# Patient Record
Sex: Female | Born: 1980
Health system: Southern US, Community
[De-identification: ages and names within clinical notes are randomized; demographics above are authoritative.]

## PROBLEM LIST (undated history)

## (undated) DIAGNOSIS — F419 Anxiety disorder, unspecified: Secondary | ICD-10-CM

## (undated) DIAGNOSIS — F4310 Post-traumatic stress disorder, unspecified: Secondary | ICD-10-CM

## (undated) DIAGNOSIS — F319 Bipolar disorder, unspecified: Secondary | ICD-10-CM

## (undated) HISTORY — DX: Post-traumatic stress disorder, unspecified: F43.10

## (undated) HISTORY — DX: Anxiety disorder, unspecified: F41.9

## (undated) HISTORY — PX: ABDOMINAL HYSTERECTOMY: SHX81

---

## 2011-06-02 ENCOUNTER — Encounter: Payer: Self-pay | Admitting: Obstetrics and Gynecology

## 2011-06-08 ENCOUNTER — Encounter: Payer: Self-pay | Admitting: Obstetrics and Gynecology

## 2011-12-07 ENCOUNTER — Emergency Department (HOSPITAL_COMMUNITY)
Admission: EM | Admit: 2011-12-07 | Discharge: 2011-12-07 | Disposition: A | Discharge status: Home / Self Care | Payer: Self-pay | Attending: Emergency Medicine | Admitting: Emergency Medicine

## 2011-12-07 ENCOUNTER — Encounter (HOSPITAL_COMMUNITY): Payer: Self-pay | Admitting: *Deleted

## 2011-12-07 DIAGNOSIS — Z90710 Acquired absence of both cervix and uterus: Secondary | ICD-10-CM | POA: Insufficient documentation

## 2011-12-07 DIAGNOSIS — R350 Frequency of micturition: Secondary | ICD-10-CM | POA: Insufficient documentation

## 2011-12-07 DIAGNOSIS — R300 Dysuria: Secondary | ICD-10-CM | POA: Insufficient documentation

## 2011-12-07 DIAGNOSIS — R319 Hematuria, unspecified: Secondary | ICD-10-CM | POA: Insufficient documentation

## 2011-12-07 DIAGNOSIS — N390 Urinary tract infection, site not specified: Secondary | ICD-10-CM | POA: Insufficient documentation

## 2011-12-07 LAB — URINALYSIS, ROUTINE W REFLEX MICROSCOPIC
Glucose, UA: NEGATIVE mg/dL
Ketones, ur: 15 mg/dL — AB
Protein, ur: 30 mg/dL — AB

## 2011-12-07 LAB — URINE MICROSCOPIC-ADD ON

## 2011-12-07 MED ORDER — CEPHALEXIN 500 MG PO CAPS: 500.0000 mg | ORAL_CAPSULE | Freq: Two times a day (BID) | ORAL | Status: AC

## 2011-12-07 NOTE — ED Provider Notes (Signed)
History     CSN: [id number]  Arrival date & time 12/07/11  2013   First MD Initiated Contact with Patient 12/07/11 2150      Chief Complaint  Patient presents with  . Abdominal Pain    (Consider location/radiation/quality/duration/timing/severity/associated sxs/prior treatment) HPI: Deanna Powell is a 31 yo AAF with history of partial hysterectomy, who presents with lower abdominal pain, dysuria and urinary frequency. Symptoms started yesterday with mild lower abdominal pain and dysuria. Symptoms progressively worsened. Pain is described as burning, non-radiating, worsened with urination, has taken Azo with relief of pain. She noticed hematuria today with small clots. She has never had these symptoms before. She denies nausea, vomiting, fever or chills. Has been able to tolerate PO today. Finally, she denies vaginal discharge.   History reviewed. No pertinent past medical history.  History reviewed. No pertinent past surgical history.  No family history on file.  History  Substance Use Topics  . Smoking status: Never Smoker   . Smokeless tobacco: Not on file  . Alcohol Use:      Comment: casual    OB History    Grav Para Term Preterm Abortions TAB SAB Ect Mult Living                 Review of Systems  Constitutional: Negative for activity change and appetite change.  Eyes: Negative for photophobia.  Respiratory: Negative for cough and shortness of breath.   Cardiovascular: Negative for chest pain and palpitations.  Gastrointestinal: Negative for nausea, vomiting, abdominal pain and diarrhea.  Genitourinary: Positive for dysuria, urgency, frequency, hematuria and difficulty urinating. Negative for flank pain, decreased urine volume and vaginal discharge.  Musculoskeletal: Negative for myalgias and arthralgias.  Skin: Negative for pallor and rash.  Neurological: Negative for dizziness and headaches.  Psychiatric/Behavioral: Negative for behavioral problems and agitation.    All other systems reviewed and are negative.   Allergies  Review of patient's allergies indicates no known allergies.  Home Medications  No current outpatient prescriptions on file.  BP 119/77  Pulse 78  Temp 97.3 F (36.3 C) (Oral)  Resp 18  SpO2 97%  Physical Exam  Nursing note and vitals reviewed. Constitutional: She is oriented to person, place, and time. She appears well-developed and well-nourished.  HENT:  Head: Normocephalic and atraumatic.  Eyes: EOM are normal. Pupils are equal, round, and reactive to light.  Neck: Normal range of motion. Neck supple.  Cardiovascular: Normal rate, regular rhythm and normal heart sounds.   Pulmonary/Chest: Effort normal and breath sounds normal.  Abdominal: Soft. Bowel sounds are normal. There is tenderness (suprapubic ). There is no rebound and no guarding.  Musculoskeletal: Normal range of motion.  Neurological: She is alert and oriented to person, place, and time.  Skin: Skin is warm and dry.    ED Course  Procedures (including critical care time)  Labs Reviewed  URINALYSIS, ROUTINE W REFLEX MICROSCOPIC - Abnormal; Notable for the following:    Color, Urine ORANGE (*)  BIOCHEMICALS MAY BE AFFECTED BY COLOR   APPearance CLOUDY (*)     Hgb urine dipstick LARGE (*)     Bilirubin Urine SMALL (*)     Ketones, ur 15 (*)     Protein, ur 30 (*)     Urobilinogen, UA 4.0 (*)     Nitrite POSITIVE (*)     Leukocytes, UA SMALL (*)     All other components within normal limits  URINE MICROSCOPIC-ADD ON - Abnormal; Notable for the  following:    Bacteria, UA MANY (*)     All other components within normal limits  PREGNANCY, URINE  URINE CULTURE   No results found.  No diagnosis found.  MDM  31 yo AAF with history of partial hysterectomy who presents with symptoms c/w urinary tract infection. Afebrile, vital signs stable. Doubt serious intraabdominal pathology as she is only tender suprapubically, no RLQ or RUQ tenderness. Doubt  obstruction as he continues to have BM's and pass flatus. Doubt vaginal infection as she has no discharge. Felt to be stable for outpatient management as she is able to tolerate PO, appears well, HD stable. Will treat with Keflex and have her continue Azo for symptomatic relief. Return precautions including multiple episodes of vomiting, worsening pain, fever or other concerning symptoms were given. She is in agreement with plan and voiced understanding. Given list of PCP's in town to establish care. Rx for Keflex provided.   Reviewed labs and utilized in MDM  Discussed case with Dr. Jeraldine Loots.   Clinical Impression 1. Urinary tract infection  Results for orders placed during the hospital encounter of 12/07/11 (from the past 24 hour(s))  URINALYSIS, ROUTINE W REFLEX MICROSCOPIC     Status: Abnormal   Collection Time   12/07/11  8:31 PM      Component Value Range   Color, Urine ORANGE (*) YELLOW   APPearance CLOUDY (*) CLEAR   Specific Gravity, Urine 1.026  1.005 - 1.030   pH 7.5  5.0 - 8.0   Glucose, UA NEGATIVE  NEGATIVE mg/dL   Hgb urine dipstick LARGE (*) NEGATIVE   Bilirubin Urine SMALL (*) NEGATIVE   Ketones, ur 15 (*) NEGATIVE mg/dL   Protein, ur 30 (*) NEGATIVE mg/dL   Urobilinogen, UA 4.0 (*) 0.0 - 1.0 mg/dL   Nitrite POSITIVE (*) NEGATIVE   Leukocytes, UA SMALL (*) NEGATIVE  URINE MICROSCOPIC-ADD ON     Status: Abnormal   Collection Time   12/07/11  8:31 PM      Component Value Range   Squamous Epithelial / LPF RARE  RARE   WBC, UA 3-6  <3 WBC/hpf   RBC / HPF 21-50  <3 RBC/hpf   Bacteria, UA MANY (*) RARE   Urine-Other MUCOUS PRESENT    PREGNANCY, URINE     Status: Normal   Collection Time   12/07/11  8:32 PM      Component Value Range   Preg Test, Ur NEGATIVE  NEGATIVE           Margie Billet, MD 12/07/11 2338

## 2011-12-07 NOTE — ED Notes (Signed)
Patient states abdominal pain x 1 day, patient states pain with urination

## 2011-12-07 NOTE — ED Notes (Signed)
Pt discharged.Vital signs stable and GCS 15 

## 2011-12-07 NOTE — ED Provider Notes (Signed)
  I performed a history and physical examination of Deanna Powell and discussed her management with Dr. Freida Busman.  I agree with the history, physical, assessment, and plan of care, with the following exceptions: None On my exam the patient was in no distress, sitting upright, speaking with her children.  [name], MD 12/07/11 2350

## 2011-12-09 LAB — URINE CULTURE

## 2011-12-10 NOTE — ED Notes (Signed)
+  Urine. Patient treated with Keflex. Sensitive to same. Per protocol MD. °

## 2012-05-17 ENCOUNTER — Encounter (HOSPITAL_COMMUNITY): Payer: Self-pay | Admitting: *Deleted

## 2012-05-17 ENCOUNTER — Emergency Department (HOSPITAL_COMMUNITY)
Admission: EM | Admit: 2012-05-17 | Discharge: 2012-05-17 | Disposition: A | Discharge status: Home / Self Care | Payer: Self-pay | Attending: Emergency Medicine | Admitting: Emergency Medicine

## 2012-05-17 DIAGNOSIS — B86 Scabies: Secondary | ICD-10-CM | POA: Insufficient documentation

## 2012-05-17 MED ORDER — PERMETHRIN 5 % EX CREA: TOPICAL_CREAM | CUTANEOUS | Status: DC

## 2012-05-17 NOTE — ED Provider Notes (Addendum)
MDM     CSN: [id number]  Arrival date & time 05/17/12  2105   First MD Initiated Contact with Patient 05/17/12 2115      Chief Complaint  Patient presents with  . Rash    (Consider location/radiation/quality/duration/timing/severity/associated sxs/prior treatment) Patient is a 32 y.o. female presenting with rash. The history is provided by the patient. No language interpreter was used.  Rash Location:  Full body Severity:  Moderate Onset quality:  Sudden Duration:  3 days Timing:  Intermittent Progression:  Spreading Chronicity:  New Context: insect bite/sting and sick contacts   Context: not pregnancy   Relieved by:  Nothing Worsened by:  Nothing tried Ineffective treatments:  None tried Associated symptoms: no diarrhea, no fever, no headaches, no joint pain, no shortness of breath, no throat swelling, no tongue swelling, no URI, not vomiting and not wheezing     History reviewed. No pertinent past medical history.  Past Surgical History  Procedure Laterality Date  . Abdominal hysterectomy      No family history on file.  History  Substance Use Topics  . Smoking status: Never Smoker   . Smokeless tobacco: Not on file  . Alcohol Use: Not on file     Comment: casual    OB History   Grav Para Term Preterm Abortions TAB SAB Ect Mult Living                  Review of Systems  Constitutional: Negative for fever.  Respiratory: Negative for shortness of breath and wheezing.   Gastrointestinal: Negative for vomiting and diarrhea.  Musculoskeletal: Negative for arthralgias.  Skin: Positive for rash.  Neurological: Negative for headaches.  All other systems reviewed and are negative.    Allergies  Review of patient's allergies indicates no known allergies.  Home Medications   Current Outpatient Rx  Name  Route  Sig  Dispense  Refill  . AZO-CRANBERRY PO   Oral   Take 2 capsules by mouth 3 (three) times daily as needed. For urinary pain         . Multiple Vitamin (MULTIVITAMIN WITH MINERALS) TABS   Oral   Take 1 tablet by mouth daily. Probiotic vitamin         . permethrin (ELIMITE) 5 % cream      Apply to affected area once and wash off after 8 hours.  Please repeat in 7-10 days qs   60 g   1     BP 130/82  Pulse 93  Temp(Src) 98.5 F (36.9 C) (Oral)  Resp 17  Wt 180 lb 8.9 oz (81.9 kg)  SpO2 100%  Physical Exam  Nursing note and vitals reviewed. Constitutional: She is oriented to person, place, and time. She appears well-developed and well-nourished.  HENT:  Head: Normocephalic.  Right Ear: External ear normal.  Left Ear: External ear normal.  Nose: Nose normal.  Mouth/Throat: Oropharynx is clear and moist.  Eyes: EOM are normal. Pupils are equal, round, and reactive to light. Right eye exhibits no discharge. Left eye exhibits no discharge.  Neck: Normal range of motion. Neck supple. No tracheal deviation present.  No nuchal rigidity no meningeal signs  Cardiovascular: Normal rate and regular rhythm.   Pulmonary/Chest: Effort normal and breath sounds normal. No stridor. No respiratory distress. She has no wheezes. She has no rales.  Abdominal: Soft. She exhibits no distension and no mass. There is no tenderness. There is no rebound and no guarding.  Musculoskeletal:  Normal range of motion. She exhibits no edema and no tenderness.  Neurological: She is alert and oriented to person, place, and time. She has normal reflexes. No cranial nerve deficit. Coordination normal.  Skin: Skin is warm. Rash noted. She is not diaphoretic. No erythema. No pallor.  No pettechia no purpura  multiple raised lesions located over arms back chest and abdomen with associated burrows petechiae no purpura no induration fluctuance or tenderness    ED Course  Procedures (including critical care time)  Labs Reviewed - No data to display No results found.   1. Scabies       MDM  Patient most likely with what appears to be  scabies. I will start on permethrin discharge home. No fever history to suggest infectious process. Patient is well-appearing and in no distress. No evidence of actual large insect bites noted. pt updated and agrees with plan.       Arley Phenix, MD 05/17/12 2209  Arley Phenix, MD 05/17/12 2209

## 2012-05-17 NOTE — ED Notes (Signed)
Pt has a rash all over her body.  She has some scabbed areas where she has been scratching.  She said there was a bedbug outbreak in her apt complex.

## 2015-01-06 DIAGNOSIS — F39 Unspecified mood [affective] disorder: Secondary | ICD-10-CM | POA: Insufficient documentation

## 2015-05-05 ENCOUNTER — Ambulatory Visit (HOSPITAL_COMMUNITY): Payer: Self-pay | Admitting: Psychiatry

## 2015-05-19 ENCOUNTER — Ambulatory Visit (INDEPENDENT_AMBULATORY_CARE_PROVIDER_SITE_OTHER): Payer: BLUE CROSS/BLUE SHIELD | Admitting: Psychiatry

## 2015-05-19 ENCOUNTER — Encounter (HOSPITAL_COMMUNITY): Payer: Self-pay | Admitting: Psychiatry

## 2015-05-19 VITALS — BP 124/76 | HR 82 | Wt 190.0 lb

## 2015-05-19 DIAGNOSIS — F0630 Mood disorder due to known physiological condition, unspecified: Secondary | ICD-10-CM | POA: Diagnosis not present

## 2015-05-19 DIAGNOSIS — F3160 Bipolar disorder, current episode mixed, unspecified: Secondary | ICD-10-CM | POA: Diagnosis not present

## 2015-05-19 DIAGNOSIS — F4310 Post-traumatic stress disorder, unspecified: Secondary | ICD-10-CM

## 2015-05-19 DIAGNOSIS — F411 Generalized anxiety disorder: Secondary | ICD-10-CM | POA: Diagnosis not present

## 2015-05-19 DIAGNOSIS — G4700 Insomnia, unspecified: Secondary | ICD-10-CM | POA: Diagnosis not present

## 2015-05-19 MED ORDER — CLONAZEPAM 0.5 MG PO TABS: 0.5000 mg | ORAL_TABLET | Freq: Two times a day (BID) | ORAL | Status: DC | PRN

## 2015-05-19 MED ORDER — SERTRALINE HCL 100 MG PO TABS: 150.0000 mg | ORAL_TABLET | Freq: Every day | ORAL | Status: DC

## 2015-05-19 MED ORDER — QUETIAPINE FUMARATE 400 MG PO TABS: 400.0000 mg | ORAL_TABLET | Freq: Every day | ORAL | Status: DC
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
## 2015-06-03 ENCOUNTER — Ambulatory Visit (HOSPITAL_COMMUNITY): Payer: Self-pay | Admitting: Psychiatry

## 2015-06-04 ENCOUNTER — Ambulatory Visit (HOSPITAL_COMMUNITY): Payer: Self-pay | Admitting: Licensed Clinical Social Worker

## 2015-06-09 ENCOUNTER — Ambulatory Visit (HOSPITAL_COMMUNITY): Payer: Self-pay | Admitting: Psychiatry

## 2015-06-23 ENCOUNTER — Ambulatory Visit (HOSPITAL_COMMUNITY): Payer: Self-pay | Admitting: Psychiatry

## 2015-06-29 LAB — HM PAP SMEAR

## 2015-08-26 ENCOUNTER — Ambulatory Visit (HOSPITAL_COMMUNITY): Payer: Self-pay | Admitting: Psychiatry

## 2015-09-13 ENCOUNTER — Ambulatory Visit (HOSPITAL_COMMUNITY): Payer: Self-pay | Admitting: Psychiatry

## 2015-09-24 DIAGNOSIS — F319 Bipolar disorder, unspecified: Secondary | ICD-10-CM | POA: Insufficient documentation

## 2015-11-05 DIAGNOSIS — B372 Candidiasis of skin and nail: Secondary | ICD-10-CM | POA: Insufficient documentation

## 2016-01-11 ENCOUNTER — Ambulatory Visit (INDEPENDENT_AMBULATORY_CARE_PROVIDER_SITE_OTHER): Payer: BLUE CROSS/BLUE SHIELD | Admitting: Physician Assistant

## 2016-01-11 VITALS — BP 138/85 | HR 96 | Temp 98.8°F | Resp 16 | Ht 66.0 in | Wt 207.0 lb

## 2016-01-11 DIAGNOSIS — J111 Influenza due to unidentified influenza virus with other respiratory manifestations: Secondary | ICD-10-CM

## 2016-01-11 DIAGNOSIS — R69 Illness, unspecified: Secondary | ICD-10-CM

## 2016-01-11 MED ORDER — HYDROCODONE-HOMATROPINE 5-1.5 MG/5ML PO SYRP: 2.5000 mL | ORAL_SOLUTION | Freq: Two times a day (BID) | ORAL | 0 refills | Status: AC

## 2016-01-11 MED ORDER — NAPROXEN 500 MG PO TABS: 500.0000 mg | ORAL_TABLET | Freq: Two times a day (BID) | ORAL | 0 refills | Status: DC

## 2016-01-11 NOTE — Patient Instructions (Signed)
     IF you received an x-ray today, you will receive an invoice from [facility]. Please contact [facility] at [phone] with questions or concerns regarding your invoice.   IF you received labwork today, you will receive an invoice from [facility]. Please contact [facility] at [phone] with questions or concerns regarding your invoice.   Our billing staff will not be able to assist you with questions regarding bills from these companies.  You will be contacted with the lab results as soon as they are available. The fastest way to get your results is to activate your My Chart account. Instructions are located on the last page of this paperwork. If you have not heard from us regarding the results in 2 weeks, please contact this office.     

## 2016-01-11 NOTE — Progress Notes (Signed)
   01/11/2016 10:57 AM   DOB: 02/26/1980 / MRN: [id number]  SUBJECTIVE:  Deanna PileRegina Powell is a 35 y.o. female presenting for productive non bloody cough that started 4 days ago.  Complains of tactile fever and fatigue, muscle aches and generalized HA. She has taken Mucinex and this did not help.  She did not get the flu shot this year. No history of asthma.  She is a non smoker.  Reports the illness was sudden in onset. No history of asthma, COPD, smoking, diabetes and history of hysterectomy. She feels she is getting worse.   She has No Known Allergies.   She  has no past medical history on file.    She  reports that she has never smoked. She does not have any smokeless tobacco history on file. She reports that she does not drink alcohol or use drugs. She  reports that she does not currently engage in sexual activity. The patient  has a past surgical history that includes Abdominal hysterectomy.  Her family history is not on file.  Review of Systems  Constitutional: Positive for chills and fever.  Musculoskeletal: Positive for myalgias.  Skin: Negative for itching and rash.  Neurological: Negative for dizziness.    The problem list and medications were reviewed and updated by myself where necessary and exist elsewhere in the encounter.   OBJECTIVE:  BP (!) 144/98   Pulse 96   Temp 98.8 F (37.1 C) (Oral)   Resp 16   Ht 5\' 6"  (1.676 m)   Wt 207 lb (93.9 kg)   SpO2 99%   BMI 33.41 kg/m   Physical Exam  Constitutional: She is oriented to person, place, and time.  HENT:  Nose: No sinus tenderness. No epistaxis. Right sinus exhibits no maxillary sinus tenderness and no frontal sinus tenderness. Left sinus exhibits no maxillary sinus tenderness and no frontal sinus tenderness.    Cardiovascular: Normal rate and regular rhythm.   Pulmonary/Chest: Effort normal and breath sounds normal. No respiratory distress. She has no wheezes. She has no rales. She exhibits no tenderness.    Musculoskeletal: Normal range of motion.  Neurological: She is alert and oriented to person, place, and time.    No results found for this or any previous visit (from the past 72 hour(s)).  No results found.  ASSESSMENT AND PLAN  Rene KocherRegina was seen today for cough, nasal congestion, sore throat and epistaxis.  Diagnoses and all orders for this visit:  Influenza-like illness: I think she probably had'has the flu.  No comorbid conditions and too late for tamiflu.  She will stay out of work for 2 days after today and I will treat her symptoms.  -     naproxen (NAPROSYN) 500 MG tablet; Take 1 tablet (500 mg total) by mouth 2 (two) times daily with a meal. -     HYDROcodone-homatropine (HYCODAN) 5-1.5 MG/5ML syrup; Take 2.5-5 mLs by mouth 2 (two) times daily. -     Care order/instruction:    The patient is advised to call or return to clinic if she does not see an improvement in symptoms, or to seek the care of the closest emergency department if she worsens with the above plan.   Deliah BostonMichael Aava Deland, MHS, PA-C [facility] 01/11/2016 10:57 AM

## 2016-02-12 ENCOUNTER — Ambulatory Visit: Payer: BLUE CROSS/BLUE SHIELD

## 2016-02-14 ENCOUNTER — Ambulatory Visit: Payer: BLUE CROSS/BLUE SHIELD

## 2016-02-16 DIAGNOSIS — Z0271 Encounter for disability determination: Secondary | ICD-10-CM

## 2016-02-17 ENCOUNTER — Ambulatory Visit (INDEPENDENT_AMBULATORY_CARE_PROVIDER_SITE_OTHER): Payer: BLUE CROSS/BLUE SHIELD | Admitting: Physician Assistant

## 2016-02-17 VITALS — BP 132/84 | HR 89 | Temp 98.7°F | Resp 16 | Ht 67.0 in | Wt 208.6 lb

## 2016-02-17 DIAGNOSIS — T07XXXA Unspecified multiple injuries, initial encounter: Secondary | ICD-10-CM | POA: Diagnosis not present

## 2016-02-17 DIAGNOSIS — Z760 Encounter for issue of repeat prescription: Secondary | ICD-10-CM | POA: Diagnosis not present
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
## 2016-02-17 NOTE — Patient Instructions (Signed)
     IF you received an x-ray today, you will receive an invoice from [facility]. Please contact [facility] at [phone] with questions or concerns regarding your invoice.   IF you received labwork today, you will receive an invoice from [facility]. Please contact [facility] at [phone] with questions or concerns regarding your invoice.   Our billing staff will not be able to assist you with questions regarding bills from these companies.  You will be contacted with the lab results as soon as they are available. The fastest way to get your results is to activate your My Chart account. Instructions are located on the last page of this paperwork. If you have not heard from us regarding the results in 2 weeks, please contact this office.     

## 2016-06-26 ENCOUNTER — Emergency Department (HOSPITAL_BASED_OUTPATIENT_CLINIC_OR_DEPARTMENT_OTHER): Payer: BLUE CROSS/BLUE SHIELD

## 2016-06-26 ENCOUNTER — Encounter (HOSPITAL_BASED_OUTPATIENT_CLINIC_OR_DEPARTMENT_OTHER): Payer: Self-pay | Admitting: *Deleted

## 2016-06-26 ENCOUNTER — Emergency Department (HOSPITAL_BASED_OUTPATIENT_CLINIC_OR_DEPARTMENT_OTHER)
Admission: EM | Admit: 2016-06-26 | Discharge: 2016-06-26 | Disposition: A | Discharge status: Home / Self Care | Payer: BLUE CROSS/BLUE SHIELD | Attending: Emergency Medicine | Admitting: Emergency Medicine

## 2016-06-26 DIAGNOSIS — S060X0A Concussion without loss of consciousness, initial encounter: Secondary | ICD-10-CM | POA: Diagnosis not present

## 2016-06-26 DIAGNOSIS — M79632 Pain in left forearm: Secondary | ICD-10-CM

## 2016-06-26 DIAGNOSIS — Y939 Activity, unspecified: Secondary | ICD-10-CM | POA: Insufficient documentation

## 2016-06-26 DIAGNOSIS — Y929 Unspecified place or not applicable: Secondary | ICD-10-CM | POA: Diagnosis not present

## 2016-06-26 DIAGNOSIS — Y999 Unspecified external cause status: Secondary | ICD-10-CM | POA: Diagnosis not present

## 2016-06-26 DIAGNOSIS — M79602 Pain in left arm: Secondary | ICD-10-CM | POA: Diagnosis present

## 2016-06-26 HISTORY — DX: Bipolar disorder, unspecified: F31.9

## 2016-06-26 MED ORDER — IBUPROFEN 800 MG PO TABS: 800.0000 mg | ORAL_TABLET | Freq: Three times a day (TID) | ORAL | 0 refills | Status: DC | PRN

## 2016-06-26 NOTE — Discharge Instructions (Addendum)
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
## 2016-06-26 NOTE — ED Notes (Signed)
Patient transported to X-ray 

## 2016-06-28 ENCOUNTER — Encounter: Payer: Self-pay | Admitting: Family Medicine

## 2016-06-28 ENCOUNTER — Other Ambulatory Visit (HOSPITAL_COMMUNITY)
Admission: RE | Admit: 2016-06-28 | Discharge: 2016-06-28 | Disposition: A | Discharge status: Home / Self Care | Payer: BLUE CROSS/BLUE SHIELD | Source: Ambulatory Visit | Attending: Family Medicine | Admitting: Family Medicine

## 2016-06-28 ENCOUNTER — Ambulatory Visit (INDEPENDENT_AMBULATORY_CARE_PROVIDER_SITE_OTHER): Payer: BLUE CROSS/BLUE SHIELD | Admitting: Family Medicine

## 2016-06-28 VITALS — BP 130/88 | HR 73 | Temp 98.3°F | Ht 67.0 in | Wt 196.0 lb

## 2016-06-28 DIAGNOSIS — F319 Bipolar disorder, unspecified: Secondary | ICD-10-CM | POA: Insufficient documentation

## 2016-06-28 DIAGNOSIS — N898 Other specified noninflammatory disorders of vagina: Secondary | ICD-10-CM

## 2016-06-28 DIAGNOSIS — S060X0A Concussion without loss of consciousness, initial encounter: Secondary | ICD-10-CM | POA: Diagnosis not present

## 2016-06-28 LAB — POC URINALSYSI DIPSTICK (AUTOMATED)
Bilirubin, UA: NEGATIVE
Glucose, UA: NEGATIVE
Ketones, UA: NEGATIVE
Leukocytes, UA: NEGATIVE
Nitrite, UA: NEGATIVE
PH UA: 6 (ref 5.0–8.0)
RBC UA: NEGATIVE
UROBILINOGEN UA: 1 U/dL

## 2016-06-28 LAB — CBC WITH DIFFERENTIAL/PLATELET
BASOS ABS: 0 10*3/uL (ref 0.0–0.1)
Basophils Relative: 0.5 % (ref 0.0–3.0)
EOS ABS: 0.2 10*3/uL (ref 0.0–0.7)
Eosinophils Relative: 2.6 % (ref 0.0–5.0)
HEMATOCRIT: 37.3 % (ref 36.0–46.0)
HEMOGLOBIN: 11.8 g/dL — AB (ref 12.0–15.0)
LYMPHS PCT: 33.6 % (ref 12.0–46.0)
Lymphs Abs: 2 10*3/uL (ref 0.7–4.0)
MCHC: 31.6 g/dL (ref 30.0–36.0)
MCV: 75.2 fl — AB (ref 78.0–100.0)
MONOS PCT: 6.1 % (ref 3.0–12.0)
Monocytes Absolute: 0.4 10*3/uL (ref 0.1–1.0)
Neutro Abs: 3.4 10*3/uL (ref 1.4–7.7)
Neutrophils Relative %: 57.2 % (ref 43.0–77.0)
Platelets: 278 10*3/uL (ref 150.0–400.0)
RBC: 4.96 Mil/uL (ref 3.87–5.11)
RDW: 16 % — ABNORMAL HIGH (ref 11.5–15.5)
WBC: 6 10*3/uL (ref 4.0–10.5)

## 2016-06-28 LAB — COMPREHENSIVE METABOLIC PANEL
ALBUMIN: 4.3 g/dL (ref 3.5–5.2)
ALK PHOS: 55 U/L (ref 39–117)
ALT: 10 U/L (ref 0–35)
AST: 12 U/L (ref 0–37)
BILIRUBIN TOTAL: 0.3 mg/dL (ref 0.2–1.2)
BUN: 12 mg/dL (ref 6–23)
CALCIUM: 9.3 mg/dL (ref 8.4–10.5)
CO2: 30 mEq/L (ref 19–32)
Chloride: 104 mEq/L (ref 96–112)
Creatinine, Ser: 0.69 mg/dL (ref 0.40–1.20)
GFR: 122.44 mL/min (ref >60.00)
Glucose, Bld: 102 mg/dL — ABNORMAL HIGH (ref 70–99)
Potassium: 3.5 mEq/L (ref 3.5–5.1)
Sodium: 140 mEq/L (ref 135–145)
Total Protein: 7.2 g/dL (ref 6.0–8.3)

## 2016-06-28 MED ORDER — DIVALPROEX SODIUM 250 MG PO DR TAB: 250.0000 mg | DELAYED_RELEASE_TABLET | Freq: Three times a day (TID) | ORAL | 1 refills | Status: DC

## 2016-06-28 MED FILL — DIVALPROEX SOD DR 250 MG TA: 250 | 30 days supply | Qty: 90 | Fill #0
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
## 2016-06-28 NOTE — Patient Instructions (Addendum)
Give your headache 3-4 weeks to settle down.  Give us 2-3 business days to get the results of your labs back.   Call some of the following to see about getting an appointment:  [facility] [address] [place], [place] [address] [phone]  [facility] [address] [place], [place] [address] [phone]  [facility] [address] [place], [place] [address] [phone]  Dr. Andee PolesParish McKinney [address] [place], [place] [address] [phone]

## 2016-06-29 LAB — URINE CYTOLOGY ANCILLARY ONLY
Chlamydia: NEGATIVE
NEISSERIA GONORRHEA: NEGATIVE
TRICH (WINDOWPATH): NEGATIVE

## 2016-07-03 ENCOUNTER — Other Ambulatory Visit: Payer: Self-pay | Admitting: Family Medicine

## 2016-07-03 LAB — URINE CYTOLOGY ANCILLARY ONLY
Bacterial vaginitis: POSITIVE — AB
Candida vaginitis: NEGATIVE

## 2016-07-03 MED ORDER — METRONIDAZOLE 500 MG PO TABS: 500.0000 mg | ORAL_TABLET | Freq: Two times a day (BID) | ORAL | 0 refills | Status: AC

## 2016-07-03 NOTE — Progress Notes (Signed)
Flagyl called in for positive culture result. MyChart message sent.

## 2016-07-10 ENCOUNTER — Telehealth: Payer: Self-pay | Admitting: *Deleted

## 2016-07-10 DIAGNOSIS — D509 Iron deficiency anemia, unspecified: Secondary | ICD-10-CM

## 2016-07-10 NOTE — Telephone Encounter (Signed)
Called and spoke with the pt regarding her recent lab results.  Pt stated that she received the MyChart message from Dr. Carmelia RollerWendling and she verbalized understanding.  Pt was scheduled a lab appt for further lab testing for (Wed-07/12/16 @ 4:00pm).  She also stated that she will be going and getting the new prescription that was sent to the pharmacy.   Asked the pt if she understood what the prescription was and how to take.  She stated that she did.   Future labs ordered and sent.//AB/CMA

## 2016-07-12 ENCOUNTER — Telehealth: Payer: Self-pay | Admitting: Family Medicine

## 2016-07-12 ENCOUNTER — Other Ambulatory Visit (INDEPENDENT_AMBULATORY_CARE_PROVIDER_SITE_OTHER): Payer: BLUE CROSS/BLUE SHIELD

## 2016-07-12 DIAGNOSIS — D509 Iron deficiency anemia, unspecified: Secondary | ICD-10-CM | POA: Diagnosis not present

## 2016-07-12 NOTE — Telephone Encounter (Signed)
Pt request sleep medication and anxiety medication. Pt states father on life support and she is in a manic state and has had no sleep for a week. Pt uses [facility] on [address]. Pt ph [phone].

## 2016-07-12 NOTE — Telephone Encounter (Signed)
Please advise.//AB/CMA 

## 2016-07-12 NOTE — Telephone Encounter (Signed)
I would very much like her to come in before prescribing any medication for that. TY.

## 2016-07-13 LAB — IRON: Iron: 28 ug/dL — ABNORMAL LOW (ref 42–145)

## 2016-07-13 LAB — FERRITIN: Ferritin: 19.3 ng/mL (ref 10.0–291.0)

## 2016-07-13 NOTE — Telephone Encounter (Signed)
[facility] @ 3:00[facility] @ [phone]) asking the pt to RTC regarding message below.//AB/CMA

## 2016-07-14 ENCOUNTER — Ambulatory Visit: Payer: Self-pay | Admitting: Family Medicine

## 2016-07-14 ENCOUNTER — Telehealth: Payer: Self-pay | Admitting: Family Medicine

## 2016-07-14 ENCOUNTER — Encounter: Payer: Self-pay | Admitting: Family Medicine

## 2016-07-14 NOTE — Telephone Encounter (Signed)
Pt lvm at 2:22 to cancel her appt. Pt says that she is stuck at work and is unable to leave.    Should pt be charged a no show fee?

## 2016-07-14 NOTE — Telephone Encounter (Signed)
No. Please reinforce the cancellation policy however.

## 2016-07-14 NOTE — Telephone Encounter (Signed)
Pt scheduled an appt for (Friday-07/14/16), and no showed.//AB/CMA

## 2016-07-26 ENCOUNTER — Ambulatory Visit (INDEPENDENT_AMBULATORY_CARE_PROVIDER_SITE_OTHER): Payer: BLUE CROSS/BLUE SHIELD | Admitting: Family Medicine

## 2016-07-26 ENCOUNTER — Encounter: Payer: Self-pay | Admitting: Family Medicine

## 2016-07-26 VITALS — BP 118/88 | HR 83 | Temp 97.8°F | Ht 67.0 in | Wt 200.8 lb

## 2016-07-26 DIAGNOSIS — B9689 Other specified bacterial agents as the cause of diseases classified elsewhere: Secondary | ICD-10-CM

## 2016-07-26 DIAGNOSIS — F319 Bipolar disorder, unspecified: Secondary | ICD-10-CM

## 2016-07-26 DIAGNOSIS — N760 Acute vaginitis: Secondary | ICD-10-CM

## 2016-07-26 MED ORDER — METRONIDAZOLE 500 MG PO TABS: 500.0000 mg | ORAL_TABLET | Freq: Two times a day (BID) | ORAL | 0 refills | Status: AC

## 2016-07-26 MED ORDER — QUETIAPINE FUMARATE 200 MG PO TABS: 200.0000 mg | ORAL_TABLET | Freq: Every day | ORAL | 2 refills | Status: DC

## 2016-07-26 MED FILL — metroNIDAZOLE 500 MG TABS: 500 | 7 days supply | Qty: 14 | Fill #0

## 2016-07-26 MED FILL — QUETIAPINE FUMARATE 200 MG: 200 | 30 days supply | Qty: 30 | Fill #0
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
## 2016-07-26 NOTE — Patient Instructions (Addendum)
[facility] [address] [place], [place] [address] [phone]  [facility] [address] [place], [place] [address] [phone]  [facility] [address] [place], [place] [address] [phone]  Dr. Andee PolesParish McKinney [address] [place], [place] [address] [phone]  Metamucil as an option for your bowels. Stay well hydrated with water. Stop drinking so much soda. Taper down.  Do not drink alcohol with the metronidazole.

## 2016-08-04 ENCOUNTER — Telehealth: Payer: Self-pay | Admitting: Family Medicine

## 2016-08-04 NOTE — Telephone Encounter (Signed)
Pt dropped off FMLA paperwork to be filled out by provider ASAP. Pt would like to be called when document ready. Document given to Princess (helping Dr Carmelia RollerWendling).

## 2016-09-06 ENCOUNTER — Ambulatory Visit: Payer: Self-pay | Admitting: Family Medicine

## 2016-09-06 ENCOUNTER — Telehealth: Payer: Self-pay | Admitting: Family Medicine

## 2016-09-06 NOTE — Telephone Encounter (Signed)
Patient called and slept through appt, she works second shift. She did reschedule for tomorrow. Charge NOS fee?

## 2016-09-06 NOTE — Telephone Encounter (Signed)
No charge. 

## 2016-09-07 ENCOUNTER — Ambulatory Visit (INDEPENDENT_AMBULATORY_CARE_PROVIDER_SITE_OTHER): Payer: BLUE CROSS/BLUE SHIELD | Admitting: Family Medicine

## 2016-09-07 ENCOUNTER — Encounter: Payer: Self-pay | Admitting: Family Medicine

## 2016-09-07 ENCOUNTER — Other Ambulatory Visit (HOSPITAL_COMMUNITY)
Admission: RE | Admit: 2016-09-07 | Discharge: 2016-09-07 | Disposition: A | Discharge status: Home / Self Care | Payer: BLUE CROSS/BLUE SHIELD | Source: Ambulatory Visit | Attending: Family Medicine | Admitting: Family Medicine

## 2016-09-07 VITALS — BP 120/84 | HR 92 | Temp 98.7°F | Ht 66.0 in | Wt 202.0 lb

## 2016-09-07 DIAGNOSIS — N898 Other specified noninflammatory disorders of vagina: Secondary | ICD-10-CM | POA: Insufficient documentation

## 2016-09-07 DIAGNOSIS — F319 Bipolar disorder, unspecified: Secondary | ICD-10-CM | POA: Diagnosis not present

## 2016-09-07 LAB — POC URINALSYSI DIPSTICK (AUTOMATED)
BILIRUBIN UA: NEGATIVE
Blood, UA: NEGATIVE
GLUCOSE UA: NEGATIVE
Ketones, UA: NEGATIVE
LEUKOCYTES UA: NEGATIVE
NITRITE UA: NEGATIVE
Protein, UA: NEGATIVE
Spec Grav, UA: 1.015 (ref 1.010–1.025)
Urobilinogen, UA: 1 E.U./dL
pH, UA: 7 (ref 5.0–8.0)

## 2016-09-07 MED ORDER — DIVALPROEX SODIUM 500 MG PO DR TAB: 500.0000 mg | DELAYED_RELEASE_TABLET | Freq: Three times a day (TID) | ORAL | 1 refills | Status: DC

## 2016-09-07 MED ORDER — CLONAZEPAM 0.5 MG PO TABS: 0.5000 mg | ORAL_TABLET | Freq: Every day | ORAL | 1 refills | Status: DC | PRN

## 2016-09-07 MED FILL — DIVALPROEX SOD 500 MG TAB D: 500 | 30 days supply | Qty: 90 | Fill #0

## 2016-09-07 NOTE — Progress Notes (Signed)
Pre visit review using our clinic review tool, if applicable. No additional management support is needed unless otherwise documented below in the visit note. 
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
## 2016-09-07 NOTE — Patient Instructions (Signed)
Please consider counseling. The medical literature and evidence-based guidelines support it. Contact [phone] to schedule an appointment or inquire about cost/insurance coverage.  Try MiraLAX 1-2 times daily over the next 3-4 days. If no improvement, try using an enema. Stay well hydrated and keep lots of fiber in your diet.  Give [place] 2-3 business days to get the results of your urine test.  Keep trying to get in with psychiatry.

## 2016-09-11 LAB — URINE CYTOLOGY ANCILLARY ONLY
CHLAMYDIA, DNA PROBE: NEGATIVE
Neisseria Gonorrhea: NEGATIVE
Trichomonas: NEGATIVE

## 2016-09-13 ENCOUNTER — Other Ambulatory Visit: Payer: Self-pay | Admitting: Family Medicine

## 2016-09-13 LAB — URINE CYTOLOGY ANCILLARY ONLY
Bacterial vaginitis: POSITIVE — AB
Candida vaginitis: NEGATIVE

## 2016-09-13 MED ORDER — METRONIDAZOLE 500 MG PO TABS: 500.0000 mg | ORAL_TABLET | Freq: Two times a day (BID) | ORAL | 0 refills | Status: DC

## 2016-09-13 NOTE — Progress Notes (Signed)
Flagyl for vaginitis called in. Pt notified via MyChart.

## 2016-10-09 ENCOUNTER — Encounter: Payer: Self-pay | Admitting: Family Medicine

## 2016-10-09 ENCOUNTER — Ambulatory Visit (INDEPENDENT_AMBULATORY_CARE_PROVIDER_SITE_OTHER): Payer: BLUE CROSS/BLUE SHIELD | Admitting: Family Medicine

## 2016-10-09 VITALS — BP 120/86 | HR 73 | Temp 97.7°F | Ht 66.0 in | Wt 203.4 lb

## 2016-10-09 DIAGNOSIS — Z0000 Encounter for general adult medical examination without abnormal findings: Secondary | ICD-10-CM

## 2016-10-09 LAB — LIPID PANEL
Cholesterol: 168 mg/dL (ref 0–200)
HDL: 40.1 mg/dL (ref >39.00)
LDL CALC: 105 mg/dL — AB (ref 0–99)
NONHDL: 127.44
Total CHOL/HDL Ratio: 4
Triglycerides: 112 mg/dL (ref 0.0–149.0)
VLDL: 22.4 mg/dL (ref 0.0–40.0)

## 2016-10-09 LAB — CBC
HEMATOCRIT: 37.4 % (ref 36.0–46.0)
Hemoglobin: 11.6 g/dL — ABNORMAL LOW (ref 12.0–15.0)
MCHC: 31.1 g/dL (ref 30.0–36.0)
MCV: 75.9 fl — AB (ref 78.0–100.0)
Platelets: 251 10*3/uL (ref 150.0–400.0)
RBC: 4.92 Mil/uL (ref 3.87–5.11)
RDW: 16.3 % — AB (ref 11.5–15.5)
WBC: 5.2 10*3/uL (ref 4.0–10.5)

## 2016-10-09 LAB — COMPREHENSIVE METABOLIC PANEL
ALT: 11 U/L (ref 0–35)
AST: 12 U/L (ref 0–37)
Albumin: 4.1 g/dL (ref 3.5–5.2)
Alkaline Phosphatase: 57 U/L (ref 39–117)
BUN: 9 mg/dL (ref 6–23)
CHLORIDE: 104 meq/L (ref 96–112)
CO2: 31 meq/L (ref 19–32)
Calcium: 8.8 mg/dL (ref 8.4–10.5)
Creatinine, Ser: 0.68 mg/dL (ref 0.40–1.20)
GFR: 124.34 mL/min (ref >60.00)
GLUCOSE: 101 mg/dL — AB (ref 70–99)
POTASSIUM: 3.4 meq/L — AB (ref 3.5–5.1)
Sodium: 141 mEq/L (ref 135–145)
Total Bilirubin: 0.4 mg/dL (ref 0.2–1.2)
Total Protein: 7.1 g/dL (ref 6.0–8.3)

## 2016-10-09 NOTE — Progress Notes (Signed)
Pre visit review using our clinic review tool, if applicable. No additional management support is needed unless otherwise documented below in the visit note. 

## 2016-10-09 NOTE — Telephone Encounter (Signed)
Patient dropped off FMLA paperwork again, and states its recertifying and would like the dates to reflect June to present reflecting the dates seen in the office, any questions or concerns please call [phone]. Please expedite due to patient dropping off form 2x. Forms handed to [place].

## 2016-10-09 NOTE — Progress Notes (Signed)
Chief Complaint  Patient presents with  . Annual Exam     Well Woman Deanna Powell is here for a complete physical.   Her last physical was >1 year ago.  Current diet: in general, an "unhealthy" diet. Current exercise: . Weight is stable and she denies daytime fatigue. No LMP recorded. Patient has had a hysterectomy.  Health Maintenance Pap/HPV- Yes Tetanus- No HIV- Yes  Past Medical History:  Diagnosis Date  . Anxiety   . Bipolar 1 disorder ([place])   . PTSD (post-traumatic stress disorder)     Past Surgical History:  Procedure Laterality Date  . ABDOMINAL HYSTERECTOMY     Fibroids/heavy menses   Medications  Current Outpatient Prescriptions on File Prior to Visit  Medication Sig Dispense Refill  . clonazePAM (KLONOPIN) 0.5 MG tablet Take 1 tablet (0.5 mg total) by mouth daily as needed for anxiety. 30 tablet 1  . divalproex (DEPAKOTE) 500 MG DR tablet Take 1 tablet (500 mg total) by mouth 3 (three) times daily. 90 tablet 1  . ibuprofen (ADVIL,MOTRIN) 800 MG tablet Take 1 tablet (800 mg total) by mouth every 8 (eight) hours as needed. 21 tablet 0  . QUEtiapine (SEROQUEL) 200 MG tablet Take 1 tablet (200 mg total) by mouth at bedtime. 30 tablet 2   Allergies No Known Allergies  Review of Systems: Constitutional:  no fevers Eye:  no recent significant change in vision Ear/Nose/Mouth/Throat:  Ears:  no tinnitus or vertigo and no recent change in hearing, Nose/Mouth/Throat:  no complaints of nasal congestion or discharge, no sore throat and no recent change in voice or hoarseness Cardiovascular:  no exercise intolerance, no chest pain, no palpitations Respiratory:  no chronic cough, sputum, or hemoptysis and no shortness of breath Gastrointestinal: +intermittent pain, +constipation, no N/V GU: Female: negative for dysuria, frequency, and incontinence, no abnormal bleeding, pelvic pain, +discharge Musculoskeletal/Extremities:  no pain, redness, or swelling of the  joints Integumentary (Skin/Breast):  no abnormal skin lesions reported, no new breast lumps or masses Neurologic:  +HA's Psychiatric:  +anxiety, +depression Endocrine: +weight gain Hematologic/Lymphatic: no abnormal bleeding  Exam BP 120/86 (BP Location: Left Arm, Patient Position: Sitting, Cuff Size: Large)   Pulse 73   Temp 97.7 F (36.5 C) (Oral)   Ht '5\' 6"'  (1.676 m)   Wt 203 lb 6 oz (92.3 kg)   SpO2 99%   BMI 32.83 kg/m  General:  well developed, well nourished, in no apparent distress Skin:  no significant moles, warts, or growths Head:  no masses, lesions, or tenderness Eyes:  pupils equal and round, sclera anicteric without injection Ears:  canals without lesions, TMs shiny without retraction, no obvious effusion, no erythema Nose:  nares patent, septum midline, mucosa normal, and no drainage or sinus tenderness Throat/Pharynx:  lips and gingiva without lesion; tongue and uvula midline; non-inflamed pharynx; no exudates or postnasal drainage Neck: neck supple without adenopathy, thyromegaly, or masses Breasts:  inspection negative, no nipple discharge or bleeding, no masses or nodularity palpable Thorax:  nontender Lungs:  clear to auscultation, breath sounds equal bilaterally, no respiratory distress Cardio:  regular rate and rhythm without murmurs, heart sounds without clicks or rubs, point of maximal impulse normal; no lifts, heaves, or thrills Abdomen:  abdomen soft, nontender; bowel sounds normal; no masses or organomegaly Genital: Not done Musculoskeletal:  symmetrical muscle groups noted without atrophy or deformity Extremities:  no clubbing, cyanosis, or edema, no deformities, no skin discoloration Neuro:  gait normal; deep tendon reflexes normal and symmetric  Psych: flat affect, appropriate judgment/insight  Assessment and Plan  Well adult exam - Plan: CBC, Comprehensive metabolic panel, Lipid panel   Well 36 y.o. female. Counseled on diet and exercise. She  needs to contact psychiatry to get an appointment. I made it clear to her again that I am not comfortable prescribing all of these medications long-term, particularly if she's not well-controlled. Other orders as above. Follow up in 6 weeks for med check, sooner if she would like to discuss the myriad of issues positive in her ROS. The patient voiced understanding and agreement to the plan.  [place], DO 10/09/16 11:46 AM

## 2016-10-09 NOTE — Patient Instructions (Addendum)
Drop off FMLA forms again if you don't hear anything from [place].  Make appt with psychiatrist.  [facility] [address] [place], [place] [address] [phone]  [facility] [address] [place], [place] [address] [phone]  [facility] [address] [place], [place] [address] [phone]  Dr. Andee Poles [address] [place], [place] [address] [phone]  Aim to do some physical exertion for 150 minutes per week. This is typically divided into 5 days per week, 30 minutes per day. The activity should be enough to get your heart rate up. Anything is better than nothing if you have time constraints.  Try an enema.   Coping skills Choose 5 that work for you:  Take a deep breath  Count to 20  Read a book  Do a puzzle  Meditate  Bake  Sing  Knit  Garden  Pray  Go outside  Call a friend  Listen to music  Take a walk  Color  Send a note  Take a bath  Watch a movie  Be alone in a quiet place  Pet an animal  Visit a friend  Journal  Exercise  Stretch   Let us know if you need anything.

## 2016-10-10 ENCOUNTER — Other Ambulatory Visit: Payer: Self-pay | Admitting: Family Medicine

## 2016-10-10 MED ORDER — POTASSIUM CHLORIDE ER 10 MEQ PO TBCR: 10.0000 meq | EXTENDED_RELEASE_TABLET | Freq: Two times a day (BID) | ORAL | 0 refills | Status: DC

## 2016-10-10 NOTE — Progress Notes (Signed)
Supp K called in, pt notified via MyChart.

## 2016-10-12 ENCOUNTER — Telehealth: Payer: Self-pay | Admitting: *Deleted

## 2016-10-12 NOTE — Telephone Encounter (Signed)
Began working on paperwork/SLS 09/27  Elliot Gault    11:40 AM  Note    Patient dropped off FMLA paperwork again, and states its recertifying and would like the dates to reflect June to present reflecting the dates seen in the office, any questions or concerns please call [phone]. Please expedite due to patient dropping off form 2x. Forms handed to [place].     August 04, 2016  Valentina Lucks   1:33 PM  Note    Pt dropped off FMLA paperwork to be filled out by provider ASAP. Pt would like to be called when document ready. Document given to [place] (helping Dr Carmelia Roller).

## 2016-10-18 NOTE — Telephone Encounter (Signed)
Completed as much as possible; forwarded to provider for approval & signature; faxed to [place]/SLS 10/03

## 2016-10-20 ENCOUNTER — Other Ambulatory Visit: Payer: Self-pay

## 2016-10-23 ENCOUNTER — Other Ambulatory Visit: Payer: Self-pay

## 2016-11-03 ENCOUNTER — Emergency Department (HOSPITAL_BASED_OUTPATIENT_CLINIC_OR_DEPARTMENT_OTHER)
Admission: EM | Admit: 2016-11-03 | Discharge: 2016-11-03 | Disposition: A | Discharge status: Home / Self Care | Payer: BLUE CROSS/BLUE SHIELD | Attending: Emergency Medicine | Admitting: Emergency Medicine

## 2016-11-03 ENCOUNTER — Encounter (HOSPITAL_BASED_OUTPATIENT_CLINIC_OR_DEPARTMENT_OTHER): Payer: Self-pay | Admitting: *Deleted

## 2016-11-03 DIAGNOSIS — Z79899 Other long term (current) drug therapy: Secondary | ICD-10-CM | POA: Insufficient documentation

## 2016-11-03 DIAGNOSIS — R040 Epistaxis: Secondary | ICD-10-CM | POA: Diagnosis not present

## 2016-11-03 DIAGNOSIS — R51 Headache: Secondary | ICD-10-CM | POA: Insufficient documentation

## 2016-11-03 DIAGNOSIS — R519 Headache, unspecified: Secondary | ICD-10-CM

## 2016-11-03 MED ORDER — NAPROXEN 500 MG PO TABS: 500.0000 mg | ORAL_TABLET | Freq: Two times a day (BID) | ORAL | 0 refills | Status: DC

## 2016-11-03 MED ORDER — NAPROXEN 250 MG PO TABS
500.0000 mg | ORAL_TABLET | Freq: Once | ORAL | Status: AC
  Administered 2016-11-03: 500 mg via ORAL
  Filled 2016-11-03: qty 2

## 2016-11-03 NOTE — ED Provider Notes (Signed)
[facility] EMERGENCY DEPARTMENT Provider Note   CSN: [id number] Arrival date & time: 11/03/16  1549     History   Chief Complaint Chief Complaint  Patient presents with  . Headache  . Epistaxis    HPI Deanna PileRegina Powell is a 36 y.o. female presents emergent department today for headache and nosebleeds. The patient states that this morning around 3 AM she was trying to get "gunk" out of her right nostril with a piece of tissue paper and upon pulling the tissue paper out she started having a nosebleed. This lasted for approximately 15 minutes. This occurred again while the patient was at work after she rubbed her nose with her hands. Again lasted for roughly 15 minutes and was out of the right nostril. No current bleeding. Patient is not on blood thinners. Patient also states she has had a dull, HA on the right frontal side since this AM. HA is similar to normal HA she gets ~1 time for months. She has been treated by her PCP in the past for this with naproxen and notes that this provided her full relief. No fever, syncope, head trauma, photophobia, phonophobia, UL throbbing pain, N/V, visual changes, stiff neck, neck pain, rash, seizure, jaw claudication, or "thunderclap" onset. Not first HA. Not worst HA of life. Similar to previous headaches in the past.   HPI  Past Medical History:  Diagnosis Date  . Anxiety   . Bipolar 1 disorder (HCC)   . PTSD (post-traumatic stress disorder)     Patient Active Problem List   Diagnosis Date Noted  . Bipolar disorder (HCC) 06/28/2016  . Episodic mood disorder (HCC) 01/06/2015    Past Surgical History:  Procedure Laterality Date  . ABDOMINAL HYSTERECTOMY     Fibroids/heavy menses    OB History    No data available       Home Medications    Prior to Admission medications   Medication Sig Start Date End Date Taking? Authorizing Provider  clonazePAM (KLONOPIN) 0.5 MG tablet Take 1 tablet (0.5 mg total) by mouth daily as needed  for anxiety. 09/07/16   Sharlene DoryWendling, Nicholas Paul, DO  divalproex (DEPAKOTE) 500 MG DR tablet Take 1 tablet (500 mg total) by mouth 3 (three) times daily. 09/07/16   Sharlene DoryWendling, Nicholas Paul, DO  ibuprofen (ADVIL,MOTRIN) 800 MG tablet Take 1 tablet (800 mg total) by mouth every 8 (eight) hours as needed. 06/26/16   Long, Arlyss RepressJoshua G, MD  potassium chloride (KLOR-CON 10) 10 MEQ tablet Take 1 tablet (10 mEq total) by mouth 2 (two) times daily. 10/10/16 10/15/16  Sharlene DoryWendling, Nicholas Paul, DO  QUEtiapine (SEROQUEL) 200 MG tablet Take 1 tablet (200 mg total) by mouth at bedtime. 07/26/16   Sharlene DoryWendling, Nicholas Paul, DO    Family History Family History  Problem Relation Age of Onset  . Diabetes Father   . Cirrhosis Father     Social History Social History  Substance Use Topics  . Smoking status: Never Smoker  . Smokeless tobacco: Never Used  . Alcohol use No     Comment: casual     Allergies   Patient has no known allergies.   Review of Systems Review of Systems  All other systems reviewed and are negative.    Physical Exam Updated Vital Signs BP 137/86   Pulse 85   Temp 98 F (36.7 C) (Oral)   Resp 18   Ht 5\' 6"  (1.676 m)   Wt 92.1 kg (203 lb)   SpO2 100%  BMI 32.77 kg/m   Physical Exam  Constitutional: She appears well-developed and well-nourished.  HENT:  Head: Normocephalic and atraumatic.  Right Ear: Hearing, tympanic membrane, external ear and ear canal normal.  Left Ear: Hearing, tympanic membrane, external ear and ear canal normal.  Nose: Nose normal. No mucosal edema, rhinorrhea, sinus tenderness, nasal deformity, septal deviation or nasal septal hematoma. No epistaxis.  No foreign bodies. Right sinus exhibits no maxillary sinus tenderness and no frontal sinus tenderness. Left sinus exhibits no maxillary sinus tenderness and no frontal sinus tenderness.  Mouth/Throat: Uvula is midline, oropharynx is clear and moist and mucous membranes are normal. No tonsillar exudate.    Small .25cm dried laceration on the inside of the right nasal septum. No scalp or temporal tenderness to palpation  Eyes: Pupils are equal, round, and reactive to light. Right eye exhibits no discharge. Left eye exhibits no discharge. No scleral icterus.  Neck: Trachea normal. Neck supple. No spinous process tenderness present. No neck rigidity. Normal range of motion present.  No meningismus  Cardiovascular: Normal rate, regular rhythm and intact distal pulses.   No murmur heard. Pulses:      Radial pulses are 2+ on the right side, and 2+ on the left side.       Dorsalis pedis pulses are 2+ on the right side, and 2+ on the left side.       Posterior tibial pulses are 2+ on the right side, and 2+ on the left side.  No lower extremity swelling or edema. Calves symmetric in size bilaterally.  Pulmonary/Chest: Effort normal and breath sounds normal. She exhibits no tenderness.  Abdominal: Soft. Bowel sounds are normal. There is no tenderness. There is no rebound and no guarding.  Musculoskeletal: She exhibits no edema.  Lymphadenopathy:    She has no cervical adenopathy.  Neurological: She is alert.  Mental Status:  Alert, oriented, thought content appropriate, able to give a coherent history. Speech fluent without evidence of aphasia. Able to follow 2 step commands without difficulty.  Cranial Nerves:  II:  Peripheral visual fields grossly normal, pupils equal, round, reactive to light III,IV, VI: ptosis not present, extra-ocular motions intact bilaterally  V,VII: smile symmetric, eyebrows raise symmetric, facial light touch sensation equal VIII: hearing grossly normal to voice  X: uvula elevates symmetrically  XI: bilateral shoulder shrug symmetric and strong XII: midline tongue extension without fassiculations Motor:  Normal tone. 5/5 in upper and lower extremities bilaterally including strong and equal grip strength and dorsiflexion/plantar flexion Sensory: Sensation intact to light  touch in all extremities. Negative Romberg.  Deep Tendon Reflexes: 2+ and symmetric in the biceps and patella Cerebellar: normal finger-to-nose with bilateral upper extremities. Normal heel-to -shin balance bilaterally of the lower extremity. No pronator drift.  Gait: normal gait and balance CV: distal pulses palpable throughout   Skin: Skin is warm and dry. No rash noted. She is not diaphoretic.  Psychiatric: She has a normal mood and affect.  Nursing note and vitals reviewed.    ED Treatments / Results  Labs (all labs ordered are listed, but only abnormal results are displayed) Labs Reviewed - No data to display  EKG  EKG Interpretation None       Radiology No results found.  Procedures Procedures (including critical care time)  Medications Ordered in ED Medications  naproxen (NAPROSYN) tablet 500 mg (not administered)     Initial Impression / Assessment and Plan / ED Course  I have reviewed the triage vital signs and  the nursing notes.  Pertinent labs & imaging results that were available during my care of the patient were reviewed by me and considered in my medical decision making (see chart for details).     Patient with nose bleed 2x today. Unilateral nose bleed and likely due to trauma with tissue paper up nostril. Small area of dried blood in nose, no active bleeding. Advised conservative treatment for this. Patient given home treatment for this if it is to reoccur and reasons to return to the ED.  Pt HA treated and improved while in ED.  Presentation is like pts typical HA and non concerning for [facility], ICH, Meningitis, or temporal arteritis. Pt is afebrile with no focal neuro deficits, nuchal rigidity, or change in vision. Patient says naproxen as worked for her in the past. Will provide rx. Pt is to follow up with PCP to discuss prophylactic medication. Pt verbalizes understanding and is agreeable with plan to dc.   Final Clinical Impressions(s) / ED Diagnoses    Final diagnoses:  Epistaxis  Nonintractable headache, unspecified chronicity pattern, unspecified headache type    New Prescriptions New Prescriptions   No medications on file     Princella Pellegrini 11/04/16 0120    Lavera Guise, MD 11/06/16 1359

## 2016-11-03 NOTE — Discharge Instructions (Signed)
NOSE BLEED You may use nasal saline to keep your mucous membranes moist. You may use a humidifier.  Other than nasal saline, please do not put anything into your nose for the next 3-4 days. Please do not blow your nose for the next 3-4 days. If your nose begins to bleed again, at this time it is okay to blow your nose and blow out all of the clots and then spray Afrin nasal spray into both nostrils and hold direct pressure for 30 minutes without stopping. Do not tilt your head back while holding direct pressure as this may lead to nausea. If this does not stop the bleeding, please return to the hospital.  I am providing you naproxen for your headaches as this as worked in the past. I would like you to follow up with your PCP in regards to this in 1 week if they are still continuing occurring.   Get help right away if: You pass out (faint). You get weak or lose feeling (have numbness) on one side of your body or face. You see two of everything (double vision). You feel sick to your stomach (nauseous) or you throw up (vomit), and you do not stop after many hours. You have trouble with your balance or with walking. You have trouble talking. You have neck pain or stiffness. You have a fever.
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
## 2016-11-03 NOTE — ED Triage Notes (Signed)
Headache since last night. She has had a nosebleed last night and at work today. No bleeding at triage.

## 2016-11-20 ENCOUNTER — Ambulatory Visit: Payer: Self-pay | Admitting: Family Medicine

## 2016-11-22 ENCOUNTER — Ambulatory Visit: Payer: Self-pay | Admitting: Family Medicine

## 2016-11-22 DIAGNOSIS — Z0289 Encounter for other administrative examinations: Secondary | ICD-10-CM

## 2016-12-15 ENCOUNTER — Encounter: Payer: Self-pay | Admitting: Family Medicine

## 2016-12-15 ENCOUNTER — Other Ambulatory Visit (HOSPITAL_COMMUNITY)
Admission: RE | Admit: 2016-12-15 | Discharge: 2016-12-15 | Disposition: A | Discharge status: Home / Self Care | Payer: BLUE CROSS/BLUE SHIELD | Source: Ambulatory Visit | Attending: Family Medicine | Admitting: Family Medicine

## 2016-12-15 ENCOUNTER — Ambulatory Visit (INDEPENDENT_AMBULATORY_CARE_PROVIDER_SITE_OTHER): Payer: BLUE CROSS/BLUE SHIELD | Admitting: Family Medicine

## 2016-12-15 VITALS — BP 130/86 | HR 89 | Temp 98.0°F | Ht 66.0 in | Wt 210.5 lb

## 2016-12-15 DIAGNOSIS — Z23 Encounter for immunization: Secondary | ICD-10-CM

## 2016-12-15 DIAGNOSIS — Z9189 Other specified personal risk factors, not elsewhere classified: Secondary | ICD-10-CM | POA: Diagnosis not present

## 2016-12-15 DIAGNOSIS — F319 Bipolar disorder, unspecified: Secondary | ICD-10-CM | POA: Diagnosis not present

## 2016-12-15 DIAGNOSIS — L309 Dermatitis, unspecified: Secondary | ICD-10-CM | POA: Diagnosis not present

## 2016-12-15 MED ORDER — QUETIAPINE FUMARATE 200 MG PO TABS: 200.0000 mg | ORAL_TABLET | Freq: Every day | ORAL | 2 refills | Status: DC

## 2016-12-15 MED ORDER — HYDROCORTISONE 2.5 % EX CREA: TOPICAL_CREAM | Freq: Two times a day (BID) | CUTANEOUS | 0 refills | Status: DC

## 2016-12-15 MED ORDER — DIVALPROEX SODIUM 500 MG PO DR TAB: 500.0000 mg | DELAYED_RELEASE_TABLET | Freq: Three times a day (TID) | ORAL | 1 refills | Status: DC

## 2016-12-15 MED ORDER — CLONAZEPAM 0.5 MG PO TABS: 0.5000 mg | ORAL_TABLET | Freq: Every day | ORAL | 1 refills | Status: DC | PRN

## 2016-12-15 MED FILL — QUETIAPINE FUMARATE 200 MG: 200 | 30 days supply | Qty: 30 | Fill #0

## 2016-12-15 MED FILL — HYDROCORTISONE 2.5% CREAM: 2.5 | 30 days supply | Qty: 30 | Fill #0

## 2016-12-15 MED FILL — clonazePAM 0.5 MG TABS: 0.5 | 30 days supply | Qty: 30 | Fill #0

## 2016-12-15 MED FILL — DIVALPROEX SOD 500 MG TAB D: 500 | 30 days supply | Qty: 90 | Fill #0
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
## 2016-12-15 NOTE — Patient Instructions (Addendum)
[facility] [address] [place], [place] [address] [phone]  [facility] [address] [place], [place] [address] [phone]  [facility] [address] [place], [place] [address] [phone]  Call one of these offices sooner than later as it can take 2-3 months to get a new patient appointment.   Let us know if you need anything.  Use lotion twice daily to help with dry skin.

## 2016-12-15 NOTE — Progress Notes (Signed)
Pre visit review using our clinic review tool, if applicable. No additional management support is needed unless otherwise documented below in the visit note. 

## 2016-12-16 LAB — HEPATIC FUNCTION PANEL
AG RATIO: 1.5 (calc) (ref 1.0–2.5)
ALBUMIN MSPROF: 4.1 g/dL (ref 3.6–5.1)
ALT: 12 U/L (ref 6–29)
AST: 14 U/L (ref 10–30)
Alkaline phosphatase (APISO): 62 U/L (ref 33–115)
BILIRUBIN TOTAL: 0.3 mg/dL (ref 0.2–1.2)
Bilirubin, Direct: 0.1 mg/dL (ref 0.0–0.2)
Globulin: 2.7 g/dL (calc) (ref 1.9–3.7)
Indirect Bilirubin: 0.2 mg/dL (calc) (ref 0.2–1.2)
Total Protein: 6.8 g/dL (ref 6.1–8.1)

## 2016-12-16 LAB — HIV ANTIBODY (ROUTINE TESTING W REFLEX): HIV: NONREACTIVE

## 2016-12-18 LAB — URINE CYTOLOGY ANCILLARY ONLY
Chlamydia: NEGATIVE
NEISSERIA GONORRHEA: NEGATIVE
TRICH (WINDOWPATH): NEGATIVE

## 2017-03-25 ENCOUNTER — Encounter: Payer: Self-pay | Admitting: Family Medicine

## 2017-04-25 ENCOUNTER — Ambulatory Visit: Payer: Self-pay | Admitting: *Deleted

## 2017-04-25 ENCOUNTER — Other Ambulatory Visit: Payer: Self-pay

## 2017-04-25 DIAGNOSIS — F319 Bipolar disorder, unspecified: Secondary | ICD-10-CM

## 2017-04-25 NOTE — Telephone Encounter (Signed)
Patient called Deanna Powell and states she is in a manic state. Nurse triaged her please see previous note. States she is out of town because she has no place to leave here but is out of her medications and is requesting a refill. Patient scheduled for appointment on 04/30/17. Please advise.I have pended medication with correct out of town pharmacy.   Requesting:clonazepam, depakote, seroquel Contract:none OZD:GUYQDS:none Last Visit:12/15/16 Next Visit:04/30/17 Last Refill:12/15/16  Please Advise

## 2017-04-25 NOTE — Telephone Encounter (Signed)
Pt having a manic episode. She has been crying, feeling depressed, not sleeping. She is having financial problems. She had been out of work, so she lost her home. Can't afford hotels in the area because of the [employer]. She decided to go back to her home town for support.  There is facility that she can try to get in if she needs to be seen and also, advised her to go to the emergency department at the nearest hospital for support.  She has since run out of her psyche medications.  Home care advice given to patient with verbal understanding.  Appointment made per pt request for Monday. Per protocol she should be seen in 24 hours. [facility] at [facility] notified and will speak with her pcp. She is requesting a refill on her medications and requesting a note for work if possible.  Reason for Disposition . Symptoms interfere with work or school  Answer Assessment - Initial Assessment Questions 1. CONCERN: "What happened that made you call today?"     Money issues 2. DEPRESSION SYMPTOM SCREENING: "How are you feeling overall?" (e.g., decreased energy, increased sleeping or difficulty sleeping, difficulty concentrating, feelings of sadness, guilt, hopelessness, or worthlessness)     Depressed, difficulty sleeping 3. RISK OF HARM - SUICIDAL IDEATION:  "Do you ever have thoughts of hurting or killing yourself?"  (e.g., yes, no, no but preoccupation with thoughts about death)   - INTENT:  "Do you have thoughts of hurting or killing yourself right NOW?" (e.g., yes, no, N/A)   - PLAN: "Do you have a specific plan for how you would do this?" (e.g., gun, knife, overdose, no plan, N/A)     Yes, no plan 4. RISK OF HARM - HOMICIDAL IDEATION:  "Do you ever have thoughts of hurting or killing someone else?"  (e.g., yes, no, no but preoccupation with thoughts about death)   - INTENT:  "Do you have thoughts of hurting or killing someone right NOW?" (e.g., yes, no, N/A)   - PLAN: "Do you have a  specific plan for how you would do this?" (e.g., gun, knife, no plan, N/A)      no 5. FUNCTIONAL IMPAIRMENT: "How have things been going for you overall in your life? Have you had any more difficulties than usual doing your normal daily activities?"  (e.g., better, same, worse; self-care, school, work, interactions)     Still somewhat stressful but not this bad 6. SUPPORT: "Who is with you now?" "Who do you live with?" "Do you have family or friends nearby who you can talk to?"      Daughter, no family or friends she can talk to 7. THERAPIST: "Do you have a counselor or therapist? Name?"     Daughter. Lives with daughter. No counselor or therapist  8. STRESSORS: "Has there been any new stress or recent changes in your life?"     Money issues, job issues.  9. DRUG ABUSE/ALCOHOL: "Do you drink alcohol or use any illegal drugs?"      alcohol occasion, no drugs 10. OTHER: "Do you have any other health or medical symptoms right now?" (e.g., fever)       Chest pain and headache 11. PREGNANCY: "Is there any chance you are pregnant?" "When was your last menstrual period?"       No, hysterectomy 2012  Protocols used: DEPRESSION-A-AH

## 2017-04-30 ENCOUNTER — Ambulatory Visit: Payer: BLUE CROSS/BLUE SHIELD | Admitting: Family Medicine

## 2017-04-30 ENCOUNTER — Telehealth: Payer: Self-pay | Admitting: Family Medicine

## 2017-04-30 NOTE — Telephone Encounter (Signed)
What questions does she have? She likely will need an appt. TY.
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
## 2017-05-01 NOTE — Telephone Encounter (Signed)
I have called the patient and number in chart is not working anymore so unable to speak to her.

## 2017-05-02 ENCOUNTER — Ambulatory Visit: Payer: BLUE CROSS/BLUE SHIELD | Admitting: Family Medicine

## 2017-05-02 ENCOUNTER — Telehealth: Payer: Self-pay | Admitting: *Deleted

## 2017-05-02 MED ORDER — CLONAZEPAM 0.5 MG PO TABS: 0.5000 mg | ORAL_TABLET | Freq: Every day | ORAL | 0 refills | Status: DC | PRN

## 2017-05-02 MED ORDER — DIVALPROEX SODIUM 500 MG PO DR TAB: 500.0000 mg | DELAYED_RELEASE_TABLET | Freq: Three times a day (TID) | ORAL | 0 refills | Status: DC

## 2017-05-02 MED ORDER — QUETIAPINE FUMARATE 200 MG PO TABS: 200.0000 mg | ORAL_TABLET | Freq: Every day | ORAL | 0 refills | Status: DC

## 2017-05-02 NOTE — Telephone Encounter (Signed)
Patient has scheduled appointment next week/prescriptions sent in [facility] and Dr. Carmelia RollerWendling.

## 2017-05-02 NOTE — Telephone Encounter (Signed)
Pt calling and feeling tearful because she can not come in for appt today. Pt states she is in [place] and [place] get [place]. Pt states that her ride did not come and she feels overwhelmed and does not know what to do.  Pt asking if previously requested medications could be filled. Talked with Princess at the office to see if medications could be refilled for pt until she could come in the office for a visit. Conference call initiated with pt and Princess so that message from Dr. Carmelia RollerWendling could be relayed to the pt.

## 2017-05-02 NOTE — Telephone Encounter (Signed)
Received request for Medical Information to Support Disability [FMLA/STD] paperwork from [facility], request is asking for Attending Physician Statement [of Behavioral Health Treating Provider] and Office/treatment notes; will discuss with PCP tomorrow upon return to office/SLS 04/17

## 2017-05-02 NOTE — Addendum Note (Signed)
Addended by: Crissie SicklesARTER, Shedrick Sarli A on: 05/02/2017 01:34 PM   Modules accepted: Orders

## 2017-05-03 NOTE — Telephone Encounter (Signed)
Paperwork forwarded to provider,as patient is scheduled for appointment on 05/07/17 per provider; appointment needed to complete paperwork/SLS 04/18

## 2017-05-07 ENCOUNTER — Ambulatory Visit: Payer: Self-pay | Admitting: Family Medicine

## 2017-05-07 DIAGNOSIS — Z0289 Encounter for other administrative examinations: Secondary | ICD-10-CM

## 2017-05-09 ENCOUNTER — Encounter: Payer: Self-pay | Admitting: Family Medicine

## 2017-05-11 ENCOUNTER — Telehealth: Payer: Self-pay | Admitting: Family Medicine

## 2017-05-11 NOTE — Telephone Encounter (Signed)
Patient dismissed from [facility] by Arva ChafeNicholas Wendling DO, effective May 09, 2017. Dismissal letter sent out by certified / registered mail.  daj

## 2017-05-17 NOTE — Telephone Encounter (Signed)
Received signed domestic return receipt verifying delivery of certified letter on May 14, 2017. Article number [id number] [phone] daj

## 2017-05-21 ENCOUNTER — Ambulatory Visit (INDEPENDENT_AMBULATORY_CARE_PROVIDER_SITE_OTHER): Payer: BLUE CROSS/BLUE SHIELD | Admitting: Family Medicine

## 2017-05-21 ENCOUNTER — Encounter: Payer: Self-pay | Admitting: Family Medicine

## 2017-05-21 VITALS — BP 108/78 | HR 81 | Temp 98.8°F | Ht 66.0 in | Wt 207.0 lb

## 2017-05-21 DIAGNOSIS — F3162 Bipolar disorder, current episode mixed, moderate: Secondary | ICD-10-CM

## 2017-05-21 MED ORDER — ARIPIPRAZOLE 5 MG PO TABS: 5.0000 mg | ORAL_TABLET | Freq: Every day | ORAL | 2 refills | Status: DC

## 2017-05-21 NOTE — Progress Notes (Signed)
Pre visit review using our clinic review tool, if applicable. No additional management support is needed unless otherwise documented below in the visit note. 

## 2017-05-21 NOTE — Progress Notes (Signed)
Chief Complaint  Patient presents with  . Follow-up    paper work  . Panic Attack    Subjective Infantof Deanna Powell presents for f/u Bipolar I.  She is currently being treated with Klonopin, Depakote, and Seroquel. She has been going through social issues since being seen last and has late-cancelled or no-showed frequently. She does not feel her Seroquel is helpful. Pt has not worked in weeks. Needs a form filled out to get her job back? I have the form today and will fill out.   ROS Psych: No current homicidal or suicidal thoughts  Past Medical History:  Diagnosis Date  . Anxiety   . Bipolar 1 disorder (HCC)   . PTSD (post-traumatic stress disorder)    Allergies as of 05/21/2017   No Known Allergies     Medication List        Accurate as of 05/21/17  4:42 PM. Always use your most recent med list.          ARIPiprazole 5 MG tablet Commonly known as:  ABILIFY Take 1 tablet (5 mg total) by mouth daily.   clonazePAM 0.5 MG tablet Commonly known as:  KLONOPIN Take 1 tablet (0.5 mg total) by mouth daily as needed for anxiety.   divalproex 500 MG DR tablet Commonly known as:  DEPAKOTE Take 1 tablet (500 mg total) by mouth 3 (three) times daily.   hydrocortisone 2.5 % cream Apply topically 2 (two) times daily.   ibuprofen 800 MG tablet Commonly known as:  ADVIL,MOTRIN Take 1 tablet (800 mg total) by mouth every 8 (eight) hours as needed.   naproxen 500 MG tablet Commonly known as:  NAPROSYN Take 1 tablet (500 mg total) by mouth 2 (two) times daily.       Exam BP 108/78 (BP Location: Left Arm, Patient Position: Sitting, Cuff Size: Normal)   Pulse 81   Temp 98.8 F (37.1 C) (Oral)   Ht  (1.676 m)   Wt 207 lb (93.9 kg)   SpO2 99%   BMI 33.41 kg/m  General:  well developed, well nourished, in no apparent distress Lungs:  clear to auscultation, breath sounds equal bilaterally, no respiratory distress Psych: tearful during portions of exam, sometimes scattered  speech and stumbling over words. Speech is not pressured.  Assessment and Plan  Bipolar disorder, current episode mixed, moderate (HCC) - Plan: ARIPiprazole (ABILIFY) 5 MG tablet  Change Seroquel to Abilify. Cont other meds. Needs to get in with psych.  Will see what options we have for her.  LB BH info given in addn to anxiety/panic attack coping mechs. F/u prn- she is in grace period  The patient voiced understanding and agreement to the plan.  Greater than 25 minutes were spent face to face with the patient with greater than 50% of this time spent counseling on above dx, coordinating care and work exceptions with her form, and treatment options.    Jilda Roche Cotter, DO 05/21/17 4:42 PM

## 2017-05-21 NOTE — Patient Instructions (Signed)
Try to find resources through your church.  Please consider counseling. Contact [phone] to schedule an appointment or inquire about cost/insurance coverage.  We will fax over your packet.  Coping skills Choose 5 that work for you:  Take a deep breath  Count to 20  Read a book  Do a puzzle  Meditate  Bake  Sing  Knit  Garden  Pray  Go outside  Call a friend  Listen to music  Take a walk  Color  Send a note  Take a bath  Watch a movie  Be alone in a quiet place  Pet an animal  Visit a friend  Journal  Exercise  Stretch   Let us know if you need anything.

## 2017-05-30 ENCOUNTER — Encounter: Payer: Self-pay | Admitting: Family Medicine

## 2017-06-04 NOTE — Telephone Encounter (Signed)
Copied from CRM [id number]. Topic: Inquiry >> Jun 01, 2017  2:51 PM Yvonna Alanis wrote: Reason for CRM: Patient called stating that she needs a change to her return to work letter. Patient states that Dr. Carmelia Roller advised her to call the office if she needed to stay out of work an additional day. Please call patient today at [phone].   Thank You!!!

## 2017-06-06 ENCOUNTER — Telehealth: Payer: Self-pay | Admitting: Family Medicine

## 2017-06-06 ENCOUNTER — Encounter: Payer: Self-pay | Admitting: Family Medicine

## 2017-06-06 NOTE — Telephone Encounter (Signed)
Copied from CRM [id number]. Topic: Inquiry >> Jun 06, 2017 12:39 PM Crist Infante wrote: Pt following up on the request to update her work note. Pt states she is in jeopardy of losing her job if she does not get the updated work note, stating she was out starting 04/20/2017 until 05/21/2017. Dr Carmelia Roller has said ok, but pt has not heard anything back. Pt states she has called several times.  Pt is having a difficult time and could really use the help. thanks Phone: [phone] Pt is using wi fi phone right now

## 2017-06-06 NOTE — Telephone Encounter (Signed)
Letter sent through my chart ok per provider covering for PCP. Patient will obtain the letter through mychart.
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
## 2017-07-16 NOTE — Telephone Encounter (Signed)
Will route to clinical manager to contact this patient

## 2017-07-16 NOTE — Telephone Encounter (Signed)
While I like her as a person, we gave her more than enough second chances and did not move to discharge until additional no shows beyond office policy. TY.

## 2017-07-17 NOTE — Telephone Encounter (Signed)
Author phoned pt. to remind her that she has been dismissed from the practice due to multiple no-shows. No answer at number provided with corresponding message: "the number you have dialed has calling restrictions that have prevented the completion of your call".

## 2017-07-18 NOTE — Telephone Encounter (Signed)
Pt calling and states that she never received a letter that was she was dismissed and pt was seen in May for an appointment. She would like to know why she was dismissed.

## 2017-07-18 NOTE — Telephone Encounter (Signed)
See telephone note dated 06/06/2017 Additional messages regarding this message. Called telephone number and is restricted unable to speak to the patient.

## 2017-07-30 ENCOUNTER — Telehealth: Payer: Self-pay | Admitting: Family Medicine

## 2017-07-30 NOTE — Telephone Encounter (Signed)
Provider agreed to reverse dismissal, chart updated

## 2017-07-30 NOTE — Telephone Encounter (Signed)
Copied from CRM [id number]. Topic: Quick Communication - See Telephone Encounter >> Jul 30, 2017  1:40 PM Deanna Powell, [name] wrote: CRM for notification. See Telephone encounter for: 07/30/17.   Pt came in office stating that she tried to set an appt with provider Carmelia Roller(Wendling)  but pt was informed that she has been dismissed from the office. Pt states did not received letter about the dismissal and that she has moved from the address since February. Pt is living at hotels for the past few months and does not have an actual address to put on her chart. Pt does not have any money to pay her phone bill and has her father on Hospice. Pt was in tears wanting to see if possible to re-establish with provider. I explained the situation to provider (since pt was only dismissed for no shows) provider gave verbal approval to re-establish pt but to inform pt that will give her one more chance and NOT to no show. Pt understood and agreed. Please re-establish pt on system per Provider and not to leave her as a dismissed pt. Pt stated will call to schedule an appt, after working out her fathers situation.

## 2017-08-03 NOTE — Addendum Note (Signed)
Addended by: Crissie SicklesARTER, Shameer Molstad A on: 08/03/2017 01:42 PM   Modules accepted: Orders

## 2017-08-15 ENCOUNTER — Ambulatory Visit (INDEPENDENT_AMBULATORY_CARE_PROVIDER_SITE_OTHER): Payer: BLUE CROSS/BLUE SHIELD | Admitting: Family Medicine

## 2017-08-15 ENCOUNTER — Encounter: Payer: Self-pay | Admitting: Family Medicine

## 2017-08-15 VITALS — BP 110/84 | HR 85 | Temp 98.6°F | Ht 66.0 in | Wt 212.1 lb

## 2017-08-15 DIAGNOSIS — R8290 Unspecified abnormal findings in urine: Secondary | ICD-10-CM

## 2017-08-15 DIAGNOSIS — F3162 Bipolar disorder, current episode mixed, moderate: Secondary | ICD-10-CM | POA: Diagnosis not present

## 2017-08-15 DIAGNOSIS — M79675 Pain in left toe(s): Secondary | ICD-10-CM | POA: Diagnosis not present

## 2017-08-15 LAB — POC URINALSYSI DIPSTICK (AUTOMATED)
Bilirubin, UA: NEGATIVE
Blood, UA: NEGATIVE
GLUCOSE UA: NEGATIVE
KETONES UA: NEGATIVE
LEUKOCYTES UA: NEGATIVE
Nitrite, UA: NEGATIVE
Protein, UA: NEGATIVE
Spec Grav, UA: 1.015 (ref 1.010–1.025)
Urobilinogen, UA: 1 E.U./dL
pH, UA: 7 (ref 5.0–8.0)

## 2017-08-15 LAB — BASIC METABOLIC PANEL
BUN: 9 mg/dL (ref 6–23)
CHLORIDE: 104 meq/L (ref 96–112)
CO2: 31 meq/L (ref 19–32)
CREATININE: 0.74 mg/dL (ref 0.40–1.20)
Calcium: 9.2 mg/dL (ref 8.4–10.5)
GFR: 112.28 mL/min (ref >60.00)
GLUCOSE: 100 mg/dL — AB (ref 70–99)
Potassium: 4.1 mEq/L (ref 3.5–5.1)
Sodium: 139 mEq/L (ref 135–145)

## 2017-08-15 LAB — URIC ACID: Uric Acid, Serum: 4.6 mg/dL (ref 2.4–7.0)

## 2017-08-15 MED ORDER — ARIPIPRAZOLE 10 MG PO TABS: 10.0000 mg | ORAL_TABLET | Freq: Every day | ORAL | 2 refills | Status: DC

## 2017-08-15 MED FILL — ARIPiprazole 10 MG TABS: 10 | 30 days supply | Qty: 30 | Fill #0

## 2017-08-15 NOTE — Patient Instructions (Addendum)
Keep the appointment with your psychiatrist.   Take 2 tabs of your Abilify until you run out then start the 10 mg tab.   Let us know if you need anything.

## 2017-08-15 NOTE — Progress Notes (Signed)
Pre visit review using our clinic review tool, if applicable. No additional management support is needed unless otherwise documented below in the visit note. 

## 2017-08-15 NOTE — Progress Notes (Signed)
Chief Complaint  Patient presents with  . Edema    urine odor  . Manic Behavior     Subjective: Patient is a 37 y.o. female here for a few complaints.  Her father recently passed away 1 week ago.  He was in hospice.  He was 39 years old.  The patient is taking this hard.  She saw her siblings recently who she does not get along with.  This caused her to go to state of mania over the past couple weeks.  She does have a history of bipolar.  She has finally gotten an appointment with psychiatry on 09/09/2017.  She was strongly encouraged to keep this appointment.  We changed her Seroquel to Abilify.  She feels this has helped a little bit.  She questions whether it could cause weight gain.  No other side effects noted.  3 days ago, her urine order and color have been abnormal.  It has been more foul-smelling and concentrated.  She has not changed her eating habits.  She is drinking normally as well.  This has seemingly resolved within the last day.  She denies any bleeding, pain, fevers, or frequency.  The first MTP of her left foot is painful.  It is warm and swollen as well.  Her father had a history of gout and she is concerned she may have the same.  ROS: Psych: As noted in HPI GU: As noted in HPI  Past Medical History:  Diagnosis Date  . Anxiety   . Bipolar 1 disorder (HCC)   . PTSD (post-traumatic stress disorder)     Objective: BP 110/84 (BP Location: Left Arm, Patient Position: Sitting, Cuff Size: Normal)   Pulse 85   Temp 98.6 F (37 C) (Oral)   Ht 5\' 6"  (1.676 m)   Wt 212 lb 2 oz (96.2 kg)   SpO2 98%   BMI 34.24 kg/m  General: Awake, appears stated age HEENT: MMM, EOMi Heart: RRR, no murmurs Lungs: CTAB, no rales, wheezes or rhonchi. No accessory muscle use MSK: +mild warmth and ttp over L 1st MTP. No edema noted. No erythema.  Psych: Age appropriate judgment and insight, normal affect and mood  Assessment and Plan: Bipolar disorder, current episode mixed, moderate  (HCC)  Pain of left great toe - Plan: Basic metabolic panel, Uric acid  Abnormal urine odor - Plan: POCT Urinalysis Dipstick (Automated), Urine Culture  Increased dose of Abilify from 5 mg daily to 10 mg daily.  Again, she was strongly encouraged to keep her appointment with psychiatry. Check uric acid and renal function for possible gout. UA is unremarkable, will culture.  Her urinary issue may have already resolved on its own. Follow-up in 4 weeks. The patient voiced understanding and agreement to the plan.  Jilda Rocheicholas Paul FlorienWendling, DO 08/15/17  12:19 PM

## 2017-08-16 LAB — URINE CULTURE
MICRO NUMBER: 90905156
SPECIMEN QUALITY:: ADEQUATE

## 2017-09-13 ENCOUNTER — Ambulatory Visit (HOSPITAL_COMMUNITY): Payer: Self-pay | Admitting: Psychiatry

## 2017-10-03 ENCOUNTER — Encounter: Payer: Self-pay | Admitting: Family Medicine

## 2017-10-03 ENCOUNTER — Ambulatory Visit (INDEPENDENT_AMBULATORY_CARE_PROVIDER_SITE_OTHER): Payer: BLUE CROSS/BLUE SHIELD | Admitting: Family Medicine

## 2017-10-03 ENCOUNTER — Other Ambulatory Visit (HOSPITAL_COMMUNITY)
Admission: RE | Admit: 2017-10-03 | Discharge: 2017-10-03 | Disposition: A | Discharge status: Home / Self Care | Payer: BLUE CROSS/BLUE SHIELD | Source: Ambulatory Visit | Attending: Family Medicine | Admitting: Family Medicine

## 2017-10-03 VITALS — BP 130/80 | HR 97 | Temp 97.7°F | Ht 66.0 in | Wt 211.2 lb

## 2017-10-03 DIAGNOSIS — F3112 Bipolar disorder, current episode manic without psychotic features, moderate: Secondary | ICD-10-CM

## 2017-10-03 DIAGNOSIS — Z114 Encounter for screening for human immunodeficiency virus [HIV]: Secondary | ICD-10-CM

## 2017-10-03 DIAGNOSIS — Z113 Encounter for screening for infections with a predominantly sexual mode of transmission: Secondary | ICD-10-CM

## 2017-10-03 MED ORDER — ZIPRASIDONE HCL 40 MG PO CAPS: 40.0000 mg | ORAL_CAPSULE | Freq: Two times a day (BID) | ORAL | 1 refills | Status: DC

## 2017-10-03 NOTE — Patient Instructions (Addendum)
Let's try a different medicine to see if it is helpful before you see the psychiatry team.   Cancel your appointment with me if you get in with psychiatry.  Give us 3-5 business days to get the results of your labs back.   Let us know if you need anything.

## 2017-10-03 NOTE — Progress Notes (Signed)
Chief Complaint  Patient presents with  . Anxiety  . Insomnia    Subjective: Patient is a 37 y.o. female here for anxiety/insomnia.  She has been working to get in with psychiatry while I manage her bipolar with Depakote, Klonopin and Abilify. She had her appt cancelled today and pushed back to October due to provider being ill. Her father and godmother recently passed away causing added stress. She has been awake for 4 d.  She is not certain that the medication is working well.  She feels that she gets "used to it".  Patient requesting screening for STIs in addition to HIV screening.  ROS: Psych: As noted in HPI  Past Medical History:  Diagnosis Date  . Anxiety   . Bipolar 1 disorder (HCC)   . PTSD (post-traumatic stress disorder)     Objective: BP 130/80 (BP Location: Left Arm, Patient Position: Sitting, Cuff Size: Normal)   Pulse 97   Temp 97.7 F (36.5 C) (Oral)   Ht 5\' 6"  (1.676 m)   Wt 211 lb 4 oz (95.8 kg)   SpO2 97%   BMI 34.10 kg/m  General: Awake, appears stated age HEENT: MMM, EOMi Heart: RRR Lungs: CTAB, no rales, wheezes or rhonchi. No accessory muscle use Psych: Age appropriate judgment and insight, normal affect and mood  Assessment and Plan: Bipolar affective disorder, currently manic, moderate (HCC) - Plan: ziprasidone (GEODON) 40 MG capsule  Routine screening for STI (sexually transmitted infection) - Plan: Urine cytology ancillary only, Urine cytology ancillary only  Screening for HIV (human immunodeficiency virus) - Plan: HIV Antibody (routine testing w rflx)  Stop Abilify, start Geodon 40 mg twice daily.  I did state that the patient that I prefer her management come from psychiatry and I am doing my best in the meantime.  I discussed the possibility of going on lithium, however she does not wish to go on this medicine at this time. I will have her follow-up in 6 weeks but she is to cancel the appointment if she gets in with psychiatry. The patient  voiced understanding and agreement to the plan.  Jilda Rocheicholas Paul [place]endling, DO 10/03/17  3:19 PM

## 2017-10-03 NOTE — Progress Notes (Signed)
Pre visit review using our clinic review tool, if applicable. No additional management support is needed unless otherwise documented below in the visit note. 

## 2017-10-04 LAB — HIV ANTIBODY (ROUTINE TESTING W REFLEX): HIV 1&2 Ab, 4th Generation: NONREACTIVE

## 2017-10-05 LAB — URINE CYTOLOGY ANCILLARY ONLY
Chlamydia: NEGATIVE
NEISSERIA GONORRHEA: NEGATIVE
TRICH (WINDOWPATH): NEGATIVE

## 2017-10-12 ENCOUNTER — Telehealth: Payer: Self-pay | Admitting: *Deleted

## 2017-10-12 NOTE — Telephone Encounter (Signed)
Received FMLA/STD paperwork from front, completed as much as possible; forwarded to provider/SLS 09/27

## 2017-10-15 ENCOUNTER — Ambulatory Visit: Payer: BLUE CROSS/BLUE SHIELD | Admitting: Family Medicine

## 2017-10-26 NOTE — Telephone Encounter (Signed)
Pt states her FMLA paperwork was not filled out completely. She states that the section for how many episodes she has had or how many appts she has had is not filled out. Please advise.

## 2017-11-06 ENCOUNTER — Ambulatory Visit (HOSPITAL_COMMUNITY): Payer: Self-pay | Admitting: Psychiatry

## 2017-11-15 ENCOUNTER — Encounter: Payer: Self-pay | Admitting: Family Medicine

## 2017-11-15 ENCOUNTER — Ambulatory Visit: Payer: Self-pay | Admitting: Family Medicine

## 2017-11-15 DIAGNOSIS — Z0289 Encounter for other administrative examinations: Secondary | ICD-10-CM

## 2017-11-19 ENCOUNTER — Encounter: Payer: Self-pay | Admitting: Family Medicine

## 2017-11-22 ENCOUNTER — Encounter: Payer: Self-pay | Admitting: Family Medicine

## 2017-11-22 ENCOUNTER — Ambulatory Visit (INDEPENDENT_AMBULATORY_CARE_PROVIDER_SITE_OTHER): Payer: BLUE CROSS/BLUE SHIELD | Admitting: Family Medicine

## 2017-11-22 VITALS — BP 120/82 | HR 82 | Temp 98.1°F | Ht 66.0 in | Wt 210.0 lb

## 2017-11-22 DIAGNOSIS — R519 Headache, unspecified: Secondary | ICD-10-CM

## 2017-11-22 DIAGNOSIS — F319 Bipolar disorder, unspecified: Secondary | ICD-10-CM | POA: Diagnosis not present

## 2017-11-22 DIAGNOSIS — R51 Headache: Secondary | ICD-10-CM

## 2017-11-22 MED ORDER — KETOROLAC TROMETHAMINE 60 MG/2ML IM SOLN
60.0000 mg | Freq: Once | INTRAMUSCULAR | Status: AC
  Administered 2017-11-22: 60 mg via INTRAMUSCULAR

## 2017-11-22 NOTE — Patient Instructions (Signed)
Fill the medicine.  Let us know if you need anything.  EXERCISES RANGE OF MOTION (ROM) AND STRETCHING EXERCISES  These exercises may help you when beginning to rehabilitate your issue. In order to successfully resolve your symptoms, you must improve your posture. These exercises are designed to help reduce the forward-head and rounded-shoulder posture which contributes to this condition. Your symptoms may resolve with or without further involvement from your physician, physical therapist or athletic trainer. While completing these exercises, remember:   Restoring tissue flexibility helps normal motion to return to the joints. This allows healthier, less painful movement and activity.  An effective stretch should be held for at least 20 seconds, although you may need to begin with shorter hold times for comfort.  A stretch should never be painful. You should only feel a gentle lengthening or release in the stretched tissue.  Do not do any stretch or exercise that you cannot tolerate.  STRETCH- Axial Extensors  Lie on your back on the floor. You may bend your knees for comfort. Place a rolled-up hand towel or dish towel, about 2 inches in diameter, under the part of your head that makes contact with the floor.  Gently tuck your chin, as if trying to make a "double chin," until you feel a gentle stretch at the base of your head.  Hold 15-20 seconds. Repeat 2-3 times. Complete this exercise 1 time per day.   STRETCH - Axial Extension   Stand or sit on a firm surface. Assume a good posture: chest up, shoulders drawn back, abdominal muscles slightly tense, knees unlocked (if standing) and feet hip width apart.  Slowly retract your chin so your head slides back and your chin slightly lowers. Continue to look straight ahead.  You should feel a gentle stretch in the back of your head. Be certain not to feel an aggressive stretch since this can cause headaches later.  Hold for 15-20  seconds. Repeat 2-3 times. Complete this exercise 1 time per day.  STRETCH - Cervical Side Bend   Stand or sit on a firm surface. Assume a good posture: chest up, shoulders drawn back, abdominal muscles slightly tense, knees unlocked (if standing) and feet hip width apart.  Without letting your nose or shoulders move, slowly tip your right / left ear to your shoulder until your feel a gentle stretch in the muscles on the opposite side of your neck.  Hold 15-20 seconds. Repeat 2-3 times. Complete this exercise 1-2 times per day.  STRETCH - Cervical Rotators   Stand or sit on a firm surface. Assume a good posture: chest up, shoulders drawn back, abdominal muscles slightly tense, knees unlocked (if standing) and feet hip width apart.  Keeping your eyes level with the ground, slowly turn your head until you feel a gentle stretch along the back and opposite side of your neck.  Hold 15-20 seconds. Repeat 2-3 times. Complete this exercise 1-2 times per day.  RANGE OF MOTION - Neck Circles   Stand or sit on a firm surface. Assume a good posture: chest up, shoulders drawn back, abdominal muscles slightly tense, knees unlocked (if standing) and feet hip width apart.  Gently roll your head down and around from the back of one shoulder to the back of the other. The motion should never be forced or painful.  Repeat the motion 10-20 times, or until you feel the neck muscles relax and loosen. Repeat 2-3 times. Complete the exercise 1-2 times per day. STRENGTHENING EXERCISES -  Cervical Strain and Sprain These exercises may help you when beginning to rehabilitate your injury. They may resolve your symptoms with or without further involvement from your physician, physical therapist, or athletic trainer. While completing these exercises, remember:   Muscles can gain both the endurance and the strength needed for everyday activities through controlled exercises.  Complete these exercises as instructed  by your physician, physical therapist, or athletic trainer. Progress the resistance and repetitions only as guided.  You may experience muscle soreness or fatigue, but the pain or discomfort you are trying to eliminate should never worsen during these exercises. If this pain does worsen, stop and make certain you are following the directions exactly. If the pain is still present after adjustments, discontinue the exercise until you can discuss the trouble with your clinician.  STRENGTH - Cervical Flexors, Isometric  Face a wall, standing about 6 inches away. Place a small pillow, a ball about 6-8 inches in diameter, or a folded towel between your forehead and the wall.  Slightly tuck your chin and gently push your forehead into the soft object. Push only with mild to moderate intensity, building up tension gradually. Keep your jaw and forehead relaxed.  Hold 10 to 20 seconds. Keep your breathing relaxed.  Release the tension slowly. Relax your neck muscles completely before you start the next repetition. Repeat 2-3 times. Complete this exercise 1 time per day.  STRENGTH- Cervical Lateral Flexors, Isometric   Stand about 6 inches away from a wall. Place a small pillow, a ball about 6-8 inches in diameter, or a folded towel between the side of your head and the wall.  Slightly tuck your chin and gently tilt your head into the soft object. Push only with mild to moderate intensity, building up tension gradually. Keep your jaw and forehead relaxed.  Hold 10 to 20 seconds. Keep your breathing relaxed.  Release the tension slowly. Relax your neck muscles completely before you start the next repetition. Repeat 2-3 times. Complete this exercise 1 time per day.  STRENGTH - Cervical Extensors, Isometric   Stand about 6 inches away from a wall. Place a small pillow, a ball about 6-8 inches in diameter, or a folded towel between the back of your head and the wall.  Slightly tuck your chin and  gently tilt your head back into the soft object. Push only with mild to moderate intensity, building up tension gradually. Keep your jaw and forehead relaxed.  Hold 10 to 20 seconds. Keep your breathing relaxed.  Release the tension slowly. Relax your neck muscles completely before you start the next repetition. Repeat 2-3 times. Complete this exercise 1 time per day.  POSTURE AND BODY MECHANICS CONSIDERATIONS Keeping correct posture when sitting, standing or completing your activities will reduce the stress put on different body tissues, allowing injured tissues a chance to heal and limiting painful experiences. The following are general guidelines for improved posture. Your physician or physical therapist will provide you with any instructions specific to your needs. While reading these guidelines, remember:  The exercises prescribed by your provider will help you have the flexibility and strength to maintain correct postures.  The correct posture provides the optimal environment for your joints to work. All of your joints have less wear and tear when properly supported by a spine with good posture. This means you will experience a healthier, less painful body.  Correct posture must be practiced with all of your activities, especially prolonged sitting and standing. Correct posture is  as important when doing repetitive low-stress activities (typing) as it is when doing a single heavy-load activity (lifting).  PROLONGED STANDING WHILE SLIGHTLY LEANING FORWARD When completing a task that requires you to lean forward while standing in one place for a long time, place either foot up on a stationary 2- to 4-inch high object to help maintain the best posture. When both feet are on the ground, the low back tends to lose its slight inward curve. If this curve flattens (or becomes too large), then the back and your other joints will experience too much stress, fatigue more quickly, and can cause pain.    RESTING POSITIONS Consider which positions are most painful for you when choosing a resting position. If you have pain with flexion-based activities (sitting, bending, stooping, squatting), choose a position that allows you to rest in a less flexed posture. You would want to avoid curling into a fetal position on your side. If your pain worsens with extension-based activities (prolonged standing, working overhead), avoid resting in an extended position such as sleeping on your stomach. Most people will find more comfort when they rest with their spine in a more neutral position, neither too rounded nor too arched. Lying on a non-sagging bed on your side with a pillow between your knees, or on your back with a pillow under your knees will often provide some relief. Keep in mind, being in any one position for a prolonged period of time, no matter how correct your posture, can still lead to stiffness.  WALKING Walk with an upright posture. Your ears, shoulders, and hips should all line up. OFFICE WORK When working at a desk, create an environment that supports good, upright posture. Without extra support, muscles fatigue and lead to excessive strain on joints and other tissues.  CHAIR:  A chair should be able to slide under your desk when your back makes contact with the back of the chair. This allows you to work closely.  The chair's height should allow your eyes to be level with the upper part of your monitor and your hands to be slightly lower than your elbows.  Body position: ? Your feet should make contact with the floor. If this is not possible, use a foot rest. ? Keep your ears over your shoulders. This will reduce stress on your neck and low back.

## 2017-11-22 NOTE — Progress Notes (Signed)
Chief Complaint  Patient presents with  . Follow-up    discuss no shows    Subjective: Patient is a 37 y.o. female here for med f/u.  Bipolar Started on Geodon. Never picked up 2/2 transportation and finances. Still on Depakote and Klonopoin. Was 1 min late for psych appt and missed it. Still having episodes where she will be awake for 3-4 straight days and then "crash" and not do anything.  Still having issues with finding a permanent place to stay and transportation.  She has been having severe headaches lately.  Rates that her current headache is 10 out of 10 despite calmly talking.  She is not having any new numbness/tingling, vision changes, or injury.  She does have neck pain associated with this.  She does not chew a lot of gum.  ROS: Psych: As noted in HPI Neuro: +HA  Past Medical History:  Diagnosis Date  . Anxiety   . Bipolar 1 disorder (HCC)   . PTSD (post-traumatic stress disorder)     Objective: BP 120/82 (BP Location: Left Arm, Patient Position: Sitting, Cuff Size: Normal)   Pulse 82   Temp 98.1 F (36.7 C) (Oral)   Ht 5\' 6"  (1.676 m)   Wt 210 lb (95.3 kg)   SpO2 99%   BMI 33.89 kg/m  General: Awake, appears stated age HEENT: MMM, EOMi Heart: RRR, no LE edema Lungs: CTAB, no rales, wheezes or rhonchi. No accessory muscle use MSK: + TTP over cervical paraspinal musculature and suboccipital triangle bilaterally, mild tenderness over the jaw and temporalis bilaterally Neuro: DTR's equal and symmetric throughout, no clonus Psych: Age appropriate judgment and insight, normal affect and mood  Assessment and Plan: Bipolar 1 disorder (HCC) - Plan: Ambulatory referral to Social Work  Nonintractable headache, unspecified chronicity pattern, unspecified headache type - Plan: ketorolac (TORADOL) injection 60 mg  Patient has appointment with psychiatry in December.  She was strongly encouraged to keep this appointment.  Show up early.  We will see if she can get  Geodon at this time now.  I will see if the social work team can help her current situation. Toradol today.  Stretches and exercises for the neck.  If no improvement, will consider adding a low-dose SNRI versus propranolol. Follow-up pending psychiatric evaluation. The patient voiced understanding and agreement to the plan.  Jilda Roche Parlier, DO 11/22/17  10:36 AM

## 2017-11-22 NOTE — Progress Notes (Signed)
Pre visit review using our clinic review tool, if applicable. No additional management support is needed unless otherwise documented below in the visit note. 

## 2017-12-24 ENCOUNTER — Encounter (HOSPITAL_COMMUNITY): Payer: Self-pay | Admitting: Psychiatry

## 2017-12-24 ENCOUNTER — Ambulatory Visit (INDEPENDENT_AMBULATORY_CARE_PROVIDER_SITE_OTHER): Payer: BLUE CROSS/BLUE SHIELD | Admitting: Psychiatry

## 2017-12-24 VITALS — BP 132/76 | Ht 66.0 in | Wt 208.0 lb

## 2017-12-24 DIAGNOSIS — F319 Bipolar disorder, unspecified: Secondary | ICD-10-CM | POA: Diagnosis not present

## 2017-12-24 DIAGNOSIS — F4310 Post-traumatic stress disorder, unspecified: Secondary | ICD-10-CM | POA: Diagnosis not present

## 2017-12-24 DIAGNOSIS — F419 Anxiety disorder, unspecified: Secondary | ICD-10-CM | POA: Diagnosis not present

## 2017-12-24 DIAGNOSIS — Z79899 Other long term (current) drug therapy: Secondary | ICD-10-CM | POA: Diagnosis not present

## 2017-12-24 MED ORDER — HYDROXYZINE PAMOATE 25 MG PO CAPS: 25.0000 mg | ORAL_CAPSULE | Freq: Every day | ORAL | 0 refills | Status: DC | PRN

## 2017-12-24 MED ORDER — ARIPIPRAZOLE 5 MG PO TABS: ORAL_TABLET | ORAL | 1 refills | Status: DC

## 2017-12-24 MED ORDER — DIVALPROEX SODIUM 500 MG PO DR TAB: DELAYED_RELEASE_TABLET | ORAL | 1 refills | Status: DC
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
## 2017-12-28 LAB — VALPROIC ACID LEVEL

## 2018-01-02 ENCOUNTER — Ambulatory Visit (HOSPITAL_COMMUNITY): Payer: Self-pay | Admitting: Psychiatry

## 2018-01-24 ENCOUNTER — Ambulatory Visit (HOSPITAL_COMMUNITY): Payer: Self-pay | Admitting: Psychiatry

## 2018-01-24 ENCOUNTER — Other Ambulatory Visit (HOSPITAL_COMMUNITY): Payer: Self-pay

## 2018-01-24 ENCOUNTER — Ambulatory Visit (INDEPENDENT_AMBULATORY_CARE_PROVIDER_SITE_OTHER): Payer: BLUE CROSS/BLUE SHIELD | Admitting: Licensed Clinical Social Worker

## 2018-01-24 DIAGNOSIS — F419 Anxiety disorder, unspecified: Secondary | ICD-10-CM

## 2018-01-24 DIAGNOSIS — F319 Bipolar disorder, unspecified: Secondary | ICD-10-CM | POA: Diagnosis not present

## 2018-01-24 DIAGNOSIS — F4310 Post-traumatic stress disorder, unspecified: Secondary | ICD-10-CM

## 2018-01-24 MED ORDER — HYDROXYZINE PAMOATE 25 MG PO CAPS: 25.0000 mg | ORAL_CAPSULE | Freq: Every day | ORAL | 0 refills | Status: DC | PRN

## 2018-01-24 MED ORDER — ARIPIPRAZOLE 5 MG PO TABS: ORAL_TABLET | ORAL | 1 refills | Status: DC

## 2018-01-24 MED ORDER — DIVALPROEX SODIUM 500 MG PO DR TAB: DELAYED_RELEASE_TABLET | ORAL | 1 refills | Status: DC

## 2018-01-25 ENCOUNTER — Other Ambulatory Visit (HOSPITAL_COMMUNITY): Payer: Self-pay

## 2018-01-25 DIAGNOSIS — F319 Bipolar disorder, unspecified: Secondary | ICD-10-CM

## 2018-01-25 DIAGNOSIS — F4310 Post-traumatic stress disorder, unspecified: Secondary | ICD-10-CM
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
## 2018-02-04 ENCOUNTER — Ambulatory Visit (HOSPITAL_COMMUNITY): Payer: BLUE CROSS/BLUE SHIELD | Admitting: Licensed Clinical Social Worker

## 2018-02-18 ENCOUNTER — Ambulatory Visit (INDEPENDENT_AMBULATORY_CARE_PROVIDER_SITE_OTHER): Payer: BLUE CROSS/BLUE SHIELD | Admitting: Licensed Clinical Social Worker

## 2018-02-18 DIAGNOSIS — F319 Bipolar disorder, unspecified: Secondary | ICD-10-CM | POA: Diagnosis not present

## 2018-02-18 NOTE — Progress Notes (Signed)
   THERAPIST PROGRESS NOTE  Session Time: 12:05pm-12:50pm  Participation Level: Active  Behavioral Response: CasualAlertDepressed and Irritable  Type of Therapy: Individual Therapy  Treatment Goals addressed: Coping  Interventions: CBT, Motivational Interviewing and Supportive  Summary: Deanna Powell is a 38 y.o. female who presents with Bipolar I disorder and recent loss of grandmother as well as continued stress at work. Client is open to attempting skills.  Suicidal/Homicidal: Nowithout intent/plan  Therapist Response: Clinician met with client, assessing for SI/HI/psychosis and overall level of functioning. Clinician processed with client the loss of her grandmother and frustrations with her family related to this. Clinician actively listened to client and validated feelings of loss and frustration. Clinician provided client with skill of progressive muscle relaxation to reduce likelihood of impulsive behaviors when irritated. Clinician and client discussed using gratitude in difficult moments to avoid others 'having power over [her] feelings."  Plan: Return again in 2 weeks.  Diagnosis: Axis I: Bipolar, mixed     Olegario Messier, LCSW 02/18/2018

## 2018-02-19 ENCOUNTER — Encounter (HOSPITAL_COMMUNITY): Payer: Self-pay | Admitting: Psychiatry

## 2018-02-19 ENCOUNTER — Ambulatory Visit (INDEPENDENT_AMBULATORY_CARE_PROVIDER_SITE_OTHER): Payer: BLUE CROSS/BLUE SHIELD | Admitting: Psychiatry

## 2018-02-19 DIAGNOSIS — F319 Bipolar disorder, unspecified: Secondary | ICD-10-CM

## 2018-02-19 DIAGNOSIS — F419 Anxiety disorder, unspecified: Secondary | ICD-10-CM

## 2018-02-19 DIAGNOSIS — F4310 Post-traumatic stress disorder, unspecified: Secondary | ICD-10-CM

## 2018-02-19 MED ORDER — LAMOTRIGINE 25 MG PO TABS: ORAL_TABLET | ORAL | 1 refills | Status: DC

## 2018-02-19 MED ORDER — ARIPIPRAZOLE 5 MG PO TABS: ORAL_TABLET | ORAL | 1 refills | Status: DC

## 2018-02-19 MED ORDER — HYDROXYZINE PAMOATE 25 MG PO CAPS: 25.0000 mg | ORAL_CAPSULE | Freq: Every day | ORAL | 0 refills | Status: DC | PRN
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
## 2018-03-04 ENCOUNTER — Ambulatory Visit (HOSPITAL_COMMUNITY): Payer: BLUE CROSS/BLUE SHIELD | Admitting: Licensed Clinical Social Worker

## 2018-03-20 ENCOUNTER — Encounter: Payer: Self-pay | Admitting: Family Medicine

## 2018-03-20 ENCOUNTER — Ambulatory Visit (INDEPENDENT_AMBULATORY_CARE_PROVIDER_SITE_OTHER): Payer: BLUE CROSS/BLUE SHIELD | Admitting: Family Medicine

## 2018-03-20 VITALS — BP 110/82 | HR 87 | Temp 98.7°F | Ht 66.0 in | Wt 208.0 lb

## 2018-03-20 DIAGNOSIS — J181 Lobar pneumonia, unspecified organism: Secondary | ICD-10-CM | POA: Diagnosis not present

## 2018-03-20 DIAGNOSIS — J189 Pneumonia, unspecified organism: Secondary | ICD-10-CM

## 2018-03-20 MED ORDER — METHYLPREDNISOLONE ACETATE 80 MG/ML IJ SUSP
80.0000 mg | Freq: Once | INTRAMUSCULAR | Status: AC
  Administered 2018-03-20: 80 mg via INTRAMUSCULAR

## 2018-03-20 MED ORDER — DOXYCYCLINE HYCLATE 100 MG PO TABS: 100.0000 mg | ORAL_TABLET | Freq: Two times a day (BID) | ORAL | 0 refills | Status: DC

## 2018-03-20 MED ORDER — BENZONATATE 100 MG PO CAPS: 100.0000 mg | ORAL_CAPSULE | Freq: Three times a day (TID) | ORAL | 0 refills | Status: DC | PRN

## 2018-03-20 NOTE — Progress Notes (Signed)
Chief Complaint  Patient presents with  . Cough  . Headache    [name] here for URI complaints.  Duration: 3 weeks  Associated symptoms: sinus headache, itchy watery eyes, wheezing and cough Denies: sinus congestion, rhinorrhea, ear pain, ear drainage, sore throat, shortness of breath and myalgia Treatment to date: Dayquil, Nyquil Sick contacts: Yes - at work and she rides bus  ROS:  Const: Denies fevers HEENT: As noted in HPI Lungs: +cough  Past Medical History:  Diagnosis Date  . Anxiety   . Bipolar 1 disorder (HCC)   . PTSD (post-traumatic stress disorder)     BP 110/82 (BP Location: Left Arm, Patient Position: Sitting, Cuff Size: Normal)   Pulse 87   Temp 98.7 F (37.1 C) (Oral)   Ht 5\' 6"  (1.676 m)   Wt 208 lb (94.3 kg)   SpO2 98%   BMI 33.57 kg/m  General: Awake, alert, appears stated age HEENT: AT, [place], ears patent b/l and TM's neg, nares patent w/o discharge, pharynx pink and without exudates, MMM Neck: No masses or asymmetry Heart: RRR Lungs: decreased breath sounds with rales in L lower lung field- clear in other fields, no accessory muscle use Psych: Age appropriate judgment and insight, normal mood and affect  Pneumonia of left lower lobe due to infectious organism (HCC) - Plan: benzonatate (TESSALON) 100 MG capsule, doxycycline (VIBRA-TABS) 100 MG tablet  Orders as above. Continue to push fluids, practice good hand hygiene, cover mouth when coughing. Letter for work given excusing for next 2 d.  F/u Mon prn. If starting to experience fevers, shaking, or shortness of breath, seek immediate care. Pt voiced understanding and agreement to the plan.  Jilda Roche [place], DO 03/20/18 9:16 AM

## 2018-03-20 NOTE — Addendum Note (Signed)
Addended by: Scharlene Gloss B on: 03/20/2018 09:55 AM   Modules accepted: Orders

## 2018-03-20 NOTE — Patient Instructions (Addendum)
Continue to push fluids, practice good hand hygiene, and cover your mouth if you cough.  If you start having fevers, shaking or shortness of breath, seek immediate care.  For symptoms, consider using Vick's VapoRub on chest or under nose, air humidifier, Benadryl at night, and elevating the head of the bed. Tylenol and ibuprofen for aches and pains you may be experiencing.   Come back Monday if you are not feeling better.  Let us know if you need anything.

## 2018-03-21 ENCOUNTER — Telehealth: Payer: Self-pay | Admitting: Family Medicine

## 2018-03-21 NOTE — Telephone Encounter (Signed)
Benzonatate 100 mc capsule is on backorder/unavailable Ok to send in Benzonatate 200mg ??

## 2018-03-22 MED ORDER — BENZONATATE 200 MG PO CAPS: 200.0000 mg | ORAL_CAPSULE | Freq: Three times a day (TID) | ORAL | 0 refills | Status: DC | PRN

## 2018-03-22 NOTE — Telephone Encounter (Signed)
Sent in

## 2018-03-22 NOTE — Telephone Encounter (Signed)
Yes TY

## 2018-03-25 ENCOUNTER — Encounter: Payer: Self-pay | Admitting: Family Medicine

## 2018-03-25 ENCOUNTER — Telehealth: Payer: Self-pay | Admitting: Family Medicine

## 2018-03-25 ENCOUNTER — Telehealth: Payer: Self-pay | Admitting: *Deleted

## 2018-03-25 NOTE — Telephone Encounter (Signed)
OK TY

## 2018-03-25 NOTE — Telephone Encounter (Signed)
Copied from CRM [id number]. Topic: Quick Communication - See Telephone Encounter >> Mar 25, 2018  2:00 PM Jens Som A wrote: CRM for notification. See Telephone encounter for: 03/25/18.  Patient is calling to extend her leave she is still not feeling better please advise [phone]

## 2018-03-25 NOTE — Telephone Encounter (Signed)
Letter completed///the patient was contacted to pickup at the  [employer].

## 2018-03-25 NOTE — Telephone Encounter (Signed)
.  Received [employer] Disability paperwork, will complete as much as possible, then forward to provider/SLS 03/09

## 2018-03-27 ENCOUNTER — Encounter: Payer: Self-pay | Admitting: Medical

## 2018-03-27 ENCOUNTER — Ambulatory Visit: Payer: Self-pay | Admitting: Family Medicine

## 2018-03-27 ENCOUNTER — Telehealth: Payer: Self-pay | Admitting: Medical

## 2018-03-27 ENCOUNTER — Telehealth: Payer: Self-pay | Admitting: Family Medicine

## 2018-03-27 ENCOUNTER — Ambulatory Visit (HOSPITAL_BASED_OUTPATIENT_CLINIC_OR_DEPARTMENT_OTHER)
Admission: RE | Admit: 2018-03-27 | Discharge: 2018-03-27 | Disposition: A | Discharge status: Home / Self Care | Payer: BLUE CROSS/BLUE SHIELD | Source: Ambulatory Visit | Attending: Medical | Admitting: Medical

## 2018-03-27 ENCOUNTER — Ambulatory Visit (INDEPENDENT_AMBULATORY_CARE_PROVIDER_SITE_OTHER): Payer: BLUE CROSS/BLUE SHIELD | Admitting: Medical

## 2018-03-27 ENCOUNTER — Encounter: Payer: Self-pay | Admitting: Family Medicine

## 2018-03-27 ENCOUNTER — Other Ambulatory Visit: Payer: Self-pay

## 2018-03-27 VITALS — BP 148/86 | HR 109 | Temp 98.6°F | Resp 14 | Ht 66.0 in | Wt 206.0 lb

## 2018-03-27 DIAGNOSIS — J40 Bronchitis, not specified as acute or chronic: Secondary | ICD-10-CM

## 2018-03-27 DIAGNOSIS — J320 Chronic maxillary sinusitis: Secondary | ICD-10-CM

## 2018-03-27 DIAGNOSIS — R05 Cough: Secondary | ICD-10-CM

## 2018-03-27 DIAGNOSIS — R0602 Shortness of breath: Secondary | ICD-10-CM | POA: Diagnosis not present

## 2018-03-27 DIAGNOSIS — R059 Cough, unspecified: Secondary | ICD-10-CM

## 2018-03-27 LAB — CBC WITH DIFFERENTIAL/PLATELET
BASOS PCT: 1.3 % (ref 0.0–3.0)
Basophils Absolute: 0.1 10*3/uL (ref 0.0–0.1)
EOS PCT: 2.1 % (ref 0.0–5.0)
Eosinophils Absolute: 0.1 10*3/uL (ref 0.0–0.7)
HCT: 38.2 % (ref 36.0–46.0)
HEMOGLOBIN: 12 g/dL (ref 12.0–15.0)
LYMPHS ABS: 1.4 10*3/uL (ref 0.7–4.0)
Lymphocytes Relative: 27.9 % (ref 12.0–46.0)
MCHC: 31.5 g/dL (ref 30.0–36.0)
MCV: 75 fl — AB (ref 78.0–100.0)
MONOS PCT: 6.3 % (ref 3.0–12.0)
Monocytes Absolute: 0.3 10*3/uL (ref 0.1–1.0)
Neutro Abs: 3.1 10*3/uL (ref 1.4–7.7)
Neutrophils Relative %: 62.4 % (ref 43.0–77.0)
Platelets: 321 10*3/uL (ref 150.0–400.0)
RBC: 5.1 Mil/uL (ref 3.87–5.11)
RDW: 16.7 % — AB (ref 11.5–15.5)
WBC: 5 10*3/uL (ref 4.0–10.5)

## 2018-03-27 MED ORDER — HYDROCODONE-HOMATROPINE 5-1.5 MG/5ML PO SYRP: 5.0000 mL | ORAL_SOLUTION | Freq: Four times a day (QID) | ORAL | 0 refills | Status: DC | PRN

## 2018-03-27 MED ORDER — CEFTRIAXONE SODIUM 500 MG IJ SOLR
1000.0000 mg | Freq: Once | INTRAMUSCULAR | Status: AC
  Administered 2018-03-27: 1000 mg via INTRAMUSCULAR

## 2018-03-27 MED ORDER — ALBUTEROL SULFATE HFA 108 (90 BASE) MCG/ACT IN AERS: 2.0000 | INHALATION_SPRAY | Freq: Four times a day (QID) | RESPIRATORY_TRACT | 2 refills | Status: DC | PRN

## 2018-03-27 MED ORDER — PREDNISONE 10 MG (21) PO TBPK: ORAL_TABLET | ORAL | 0 refills | Status: DC

## 2018-03-27 MED ORDER — CEFTRIAXONE SODIUM 500 MG IJ SOLR: 500.0000 mg | Freq: Once | INTRAMUSCULAR | Status: DC

## 2018-03-27 NOTE — Telephone Encounter (Signed)
[facility]. Canceled rx sent to [facility].

## 2018-03-27 NOTE — Telephone Encounter (Signed)
Copied from CRM [id number]. Topic: Quick Communication - Rx Refill/Question >> Mar 27, 2018 12:45 PM Crist Infante wrote: Medication: albuterol (PROVENTIL HFA;VENTOLIN HFA) 108 (90 Base) MCG/ACT inhaler HYDROcodone-homatropine (HYCODAN) 5-1.5 MG/5ML syrup predniSONE (STERAPRED UNI-PAK 21 TAB) 10 MG (21) TBPK tablet  Pt seen today and these meds were sent to [facility] pharmacy.  Pt's insurance is not contracted with any other pharmacy except [facility].  [facility]ing [facility]s transferred to [id number] [place] Otto, [place] - [address] AT [place] [address] [phone] (Phone) [phone] (Fax)

## 2018-03-27 NOTE — Progress Notes (Signed)
Subjective:    Patient ID: Deanna Powell, female    DOB: June 17, 1980, 38 y.o.   MRN: [id number]  HPI  Pt in for been sick for 2 weeks. Pt states coughing with globs of phelm with chest congestion. Pt has not had chest xray. Pt diagnosed with probable pneumonia on 03-20-2018 the other day. Pt was given doxycycline antibiotic.   Pt states was wheezing little bit before she saw Dr. Carmelia Roller. She states wheezing has gotten worse.  Pt is not a smoker.  Has not been traveling.   Review of Systems  Constitutional: Positive for fever. Negative for chills and fatigue.       Off and on fever subjective  HENT: Positive for sinus pressure and sinus pain. Negative for congestion, facial swelling, hearing loss and mouth sores.   Respiratory: Positive for cough. Negative for chest tightness.   Cardiovascular: Negative for chest pain and palpitations.  Musculoskeletal: Negative for back pain, gait problem, myalgias, neck pain and neck stiffness.  Neurological: Negative for dizziness, speech difficulty, weakness, numbness and headaches.  Hematological: Negative for adenopathy. Does not bruise/bleed easily.  Psychiatric/Behavioral: Negative for behavioral problems and confusion.    Past Medical History:  Diagnosis Date  . Anxiety   . Bipolar 1 disorder (HCC)   . PTSD (post-traumatic stress disorder)      Social History   Socioeconomic History  . Marital status: Single    Spouse name: Not on file  . Number of children: Not on file  . Years of education: Not on file  . Highest education level: Not on file  Occupational History  . Not on file  Social Needs  . Financial resource strain: Not on file  . Food insecurity:    Worry: Not on file    Inability: Not on file  . Transportation needs:    Medical: Not on file    Non-medical: Not on file  Tobacco Use  . Smoking status: Never Smoker  . Smokeless tobacco: Never Used  Substance and Sexual Activity  . Alcohol use: No    Comment:  casual  . Drug use: No  . Sexual activity: Not Currently  Lifestyle  . Physical activity:    Days per week: Not on file    Minutes per session: Not on file  . Stress: Not on file  Relationships  . Social connections:    Talks on phone: Not on file    Gets together: Not on file    Attends religious service: Not on file    Active member of club or organization: Not on file    Attends meetings of clubs or organizations: Not on file    Relationship status: Not on file  . Intimate partner violence:    Fear of current or ex partner: Not on file    Emotionally abused: Not on file    Physically abused: Not on file    Forced sexual activity: Not on file  Other Topics Concern  . Not on file  Social History Narrative  . Not on file    Past Surgical History:  Procedure Laterality Date  . ABDOMINAL HYSTERECTOMY     Fibroids/heavy menses    Family History  Problem Relation Age of Onset  . Diabetes Father   . Cirrhosis Father     No Known Allergies  Current Outpatient Medications on File Prior to Visit  Medication Sig Dispense Refill  . ARIPiprazole (ABILIFY) 5 MG tablet Take 1/2 tab daily for one week and  than full tab daily 30 tablet 1  . benzonatate (TESSALON) 200 MG capsule Take 1 capsule (200 mg total) by mouth 3 (three) times daily as needed for cough. 20 capsule 0  . doxycycline (VIBRA-TABS) 100 MG tablet Take 1 tablet (100 mg total) by mouth 2 (two) times daily. 20 tablet 0  . hydrOXYzine (VISTARIL) 25 MG capsule Take 1 capsule (25 mg total) by mouth daily as needed for anxiety. 30 capsule 0  . lamoTRIgine (LAMICTAL) 25 MG tablet Take one tab daily for one week and than 2 tab daily 60 tablet 1   No current facility-administered medications on file prior to visit.     BP (!) 148/86 (BP Location: Right Arm, Patient Position: Sitting, Cuff Size: Normal)   Pulse (!) 109   Temp 98.6 F (37 C)   Resp 14   Ht 5\' 6"  (1.676 m)   Wt 206 lb (93.4 kg)   SpO2 100%   BMI 33.25  kg/m       Objective:   Physical Exam  General  Mental Status - Alert. General Appearance - Well groomed. Not in acute distress.  Skin Rashes- No Rashes.  HEENT Head- Normal. Ear Auditory Canal - Left- Normal. Right - Normal.Tympanic Membrane- Left- Normal. Right- Normal. Eye Sclera/Conjunctiva- Left- Normal. Right- Normal. Nose & Sinuses Nasal Mucosa- Left-  Boggy and Congested. Right-  Boggy and  Congested.Bilateral maxillary and frontal sinus pressure. Mouth & Throat Lips: Upper Lip- Normal: no dryness, cracking, pallor, cyanosis, or vesicular eruption. Lower Lip-Normal: no dryness, cracking, pallor, cyanosis or vesicular eruption. Buccal Mucosa- Bilateral- No Aphthous ulcers. Oropharynx- No Discharge or Erythema. Tonsils: Characteristics- Bilateral- No Erythema or Congestion. Size/Enlargement- Bilateral- No enlargement. Discharge- bilateral-None.  Neck Neck- Supple. No Masses.   Chest and Lung Exam Auscultation: Breath Sounds:-Clear even and unlabored.  Cardiovascular Auscultation:Rythm- Regular, rate and rhythm. Murmurs & Other Heart Sounds:Ausculatation of the heart reveal- No Murmurs.  Lymphatic Head & Neck General Head & Neck Lymphatics: Bilateral: Description- No Localized lymphadenopathy.       Assessment & Plan:  You do appear to have probable bronchitis versus pneumonia.  You mention PCP had diagnosed you with probable pneumonia and you are given doxycycline.  Symptoms persist despite this.  I will go ahead and get a chest x-ray today and CBC to evaluate your infection fighting cells.  We gave you Rocephin 1 g IM injection today.  Will follow your chest x-ray results.  Might need to give different antibiotic.  Some wheezing recently daily.  Will prescribe albuterol inhaler to use every 4-6 hours if needed.  Also writing 6-day taper dose of prednisone that you can start today.  Work note to be out until next Tuesday.  Return to work provided that your  symptoms have improved.  But if no improvement can give you further number of days.  For your recent severe cough I am prescribing Hycodan.  Rx advisement given regarding sedation side effects.  Not to drive or operate any heavy machinery.  If you do start to go back to work then recommend may be just using it before you go to sleep.  Follow-up in 7 days or as needed.  Esperanza Richters, PA-C

## 2018-03-27 NOTE — Telephone Encounter (Signed)
OK. She saw someone else.

## 2018-03-27 NOTE — Patient Instructions (Signed)
You do appear to have probable bronchitis versus pneumonia.  You mention PCP had diagnosed you with probable pneumonia and you are given doxycycline.  Symptoms persist despite this.  I will go ahead and get a chest x-ray today and CBC to evaluate your infection fighting cells.  We gave you Rocephin 1 g IM injection today.  Will follow your chest x-ray results.  Might need to give different antibiotic.  Some wheezing recently daily.  Will prescribe albuterol inhaler to use every 4-6 hours if needed.  Also writing 6-day taper dose of prednisone that you can start today.  Work note to be out until next Tuesday.  Return to work provided that your symptoms have improved.  But if no improvement can give you further number of days.  For your recent severe cough I am prescribing Hycodan.  Rx advisement given regarding sedation side effects.  Not to drive or operate any heavy machinery.  If you do start to go back to work then recommend may be just using it before you go to sleep.  Follow-up in 7 days or as needed.

## 2018-03-27 NOTE — Telephone Encounter (Signed)
Please send to [facility] for this patient since she was seen by you today and not PCP. Thanks,

## 2018-03-27 NOTE — Telephone Encounter (Signed)
Rx hycodan to pt pharmacy of choice.

## 2018-03-28 ENCOUNTER — Telehealth: Payer: Self-pay | Admitting: Family Medicine

## 2018-03-28 NOTE — Telephone Encounter (Signed)
Stay on same treatment advised. She had bronchitis type signs and symptoms. Also on exam per note some sinus pressure. Antibiotic given has coverage for sinus infection as well.

## 2018-03-28 NOTE — Telephone Encounter (Signed)
Patient informed of results///she stated if she has no pneumonia then what does she have?

## 2018-03-28 NOTE — Telephone Encounter (Signed)
No pneumonia seen on chest xray. Will you notify pt. I resulted that note already. Pt wants update. Xray showed no pneumonia.

## 2018-03-28 NOTE — Telephone Encounter (Signed)
Patient informed of provider response and had no other questions.

## 2018-03-28 NOTE — Telephone Encounter (Signed)
Copied from CRM [id number]. Topic: General - Other >> Mar 28, 2018 12:21 PM Tamela Oddi wrote: Reason for CRM: Patient called to get the results of her chest x-ray.  She stated that no one has called her to give her the results.  Please advise and call patient with results at [phone]

## 2018-03-28 NOTE — Telephone Encounter (Signed)
Forwarded to provider/SLS 03/11 

## 2018-04-02 NOTE — Telephone Encounter (Signed)
Paperwork faxed with confirmation on Fri, 03/29/18//SLS

## 2018-04-25 ENCOUNTER — Ambulatory Visit (INDEPENDENT_AMBULATORY_CARE_PROVIDER_SITE_OTHER): Payer: BLUE CROSS/BLUE SHIELD | Admitting: Psychiatry

## 2018-04-25 DIAGNOSIS — F419 Anxiety disorder, unspecified: Secondary | ICD-10-CM

## 2018-04-25 DIAGNOSIS — F4310 Post-traumatic stress disorder, unspecified: Secondary | ICD-10-CM

## 2018-04-25 DIAGNOSIS — F319 Bipolar disorder, unspecified: Secondary | ICD-10-CM | POA: Diagnosis not present

## 2018-04-25 MED ORDER — ARIPIPRAZOLE 5 MG PO TABS: ORAL_TABLET | ORAL | 1 refills | Status: DC

## 2018-04-25 MED ORDER — LAMOTRIGINE 100 MG PO TABS: ORAL_TABLET | ORAL | 1 refills | Status: DC

## 2018-04-25 MED ORDER — HYDROXYZINE PAMOATE 25 MG PO CAPS: 25.0000 mg | ORAL_CAPSULE | Freq: Every day | ORAL | 0 refills | Status: DC | PRN
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
## 2018-06-27 ENCOUNTER — Ambulatory Visit (INDEPENDENT_AMBULATORY_CARE_PROVIDER_SITE_OTHER): Payer: BC Managed Care – PPO | Admitting: Psychiatry

## 2018-06-27 ENCOUNTER — Other Ambulatory Visit: Payer: Self-pay

## 2018-06-27 ENCOUNTER — Encounter (HOSPITAL_COMMUNITY): Payer: Self-pay | Admitting: Psychiatry

## 2018-06-27 DIAGNOSIS — F4310 Post-traumatic stress disorder, unspecified: Secondary | ICD-10-CM

## 2018-06-27 DIAGNOSIS — F319 Bipolar disorder, unspecified: Secondary | ICD-10-CM | POA: Diagnosis not present

## 2018-06-27 DIAGNOSIS — F419 Anxiety disorder, unspecified: Secondary | ICD-10-CM

## 2018-06-27 MED ORDER — HYDROXYZINE PAMOATE 25 MG PO CAPS: 25.0000 mg | ORAL_CAPSULE | Freq: Every day | ORAL | 0 refills | Status: DC | PRN

## 2018-06-27 MED ORDER — LAMOTRIGINE 150 MG PO TABS: ORAL_TABLET | ORAL | 1 refills | Status: DC

## 2018-06-27 MED ORDER — ARIPIPRAZOLE 5 MG PO TABS: ORAL_TABLET | ORAL | 1 refills | Status: DC

## 2018-06-27 NOTE — Progress Notes (Signed)
Virtual Visit via Telephone Note  I connected with Anselm Pancoast on 06/27/18 at 10:40 AM EDT by telephone and verified that I am speaking with the correct person using two identifiers.   I discussed the limitations, risks, security and privacy concerns of performing an evaluation and management service by telephone and the availability of in person appointments. I also discussed with the patient that there may be a patient responsible charge related to this service. The patient expressed understanding and agreed to proceed.   History of Present Illness: Patient was evaluated through phone session.  She is still struggling with anxiety but overall she feels better with the increase Lamictal.  She is sleeping better and had cut down her drinking.  She only drinks 1-2 beer once a week.  She also noticed less crying spells but she still have flashbacks, nightmares and racing thoughts.  She is anxious because she is not sure where she will move as she is furlough and not sure if she will get her job back.  She is living with family member.  She is tolerating her medication without any side effects.  She has no rash, itching, tremors or shakes.  She is working at [employer] and now she started looking for another job. She Is taking hydroxyzine 25 mg as needed for insomniaHer appetite is okay.  Energy level is fair.   Psychiatric Specialty Exam: Physical Exam  ROS  There were no vitals taken for this visit.There is no height or weight on file to calculate BMI.  General Appearance: NA  Eye Contact:  NA  Speech:  Clear and Coherent  Volume:  Decreased  Mood:  Anxious and Dysphoric  Affect:  NA  Thought Process:  Goal Directed  Orientation:  Full (Time, Place, and Person)  Thought Content:  Rumination  Suicidal Thoughts:  No  Homicidal Thoughts:  No  Memory:  Immediate;   Good Recent;   Good Remote;   Good  Judgement:  Good  Insight:  Good  Psychomotor Activity:  NA  Concentration:   Concentration: Fair and Attention Span: Fair  Recall:  Good  Fund of Knowledge:  Good  Language:  Good  Akathisia:  No  Handed:  Right  AIMS (if indicated):     Assets:  Communication Skills Desire for Improvement Resilience  ADL's:  Intact  Cognition:  WNL  Sleep:   improved      Assessment and Plan: Patient shown some improvement with the Lamictal.  She has not resume therapy with [facility].  Encouraged to keep appointment with [facility].  Recommend to try Lamictal 150 mg as she is not having any side effects.  Continue Abilify 5 mg daily and hydroxyzine 25 mg as needed for insomnia and anxiety.  Discussed medication side effects and benefits.  Recommended to call us back if she has any question or any concern.  Follow-up in 2 months.    Follow Up Instructions:    I discussed the assessment and treatment plan with the patient. The patient was provided an opportunity to ask questions and all were answered. The patient agreed with the plan and demonstrated an understanding of the instructions.   The patient was advised to call back or seek an in-person evaluation if the symptoms worsen or if the condition fails to improve as anticipated.  I provided 15 minutes of non-face-to-face time during this encounter.   Kathlee Nations, MD
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
## 2018-08-27 ENCOUNTER — Other Ambulatory Visit (HOSPITAL_COMMUNITY)
Admission: RE | Admit: 2018-08-27 | Discharge: 2018-08-27 | Disposition: A | Discharge status: Home / Self Care | Payer: BC Managed Care – PPO | Source: Ambulatory Visit | Attending: Family Medicine | Admitting: Family Medicine

## 2018-08-27 ENCOUNTER — Telehealth (HOSPITAL_COMMUNITY): Payer: Self-pay | Admitting: Psychiatry

## 2018-08-27 ENCOUNTER — Ambulatory Visit (INDEPENDENT_AMBULATORY_CARE_PROVIDER_SITE_OTHER): Payer: BC Managed Care – PPO | Admitting: Psychiatry

## 2018-08-27 ENCOUNTER — Other Ambulatory Visit: Payer: Self-pay

## 2018-08-27 ENCOUNTER — Other Ambulatory Visit: Payer: Self-pay | Admitting: Family Medicine

## 2018-08-27 ENCOUNTER — Encounter: Payer: Self-pay | Admitting: Family Medicine

## 2018-08-27 ENCOUNTER — Encounter (HOSPITAL_COMMUNITY): Payer: Self-pay | Admitting: Psychiatry

## 2018-08-27 ENCOUNTER — Ambulatory Visit (INDEPENDENT_AMBULATORY_CARE_PROVIDER_SITE_OTHER): Payer: BC Managed Care – PPO | Admitting: Family Medicine

## 2018-08-27 VITALS — BP 122/82 | HR 95 | Temp 97.3°F | Ht 66.0 in | Wt 214.0 lb

## 2018-08-27 VITALS — Wt 222.0 lb

## 2018-08-27 DIAGNOSIS — R51 Headache: Secondary | ICD-10-CM | POA: Diagnosis not present

## 2018-08-27 DIAGNOSIS — F4310 Post-traumatic stress disorder, unspecified: Secondary | ICD-10-CM | POA: Diagnosis not present

## 2018-08-27 DIAGNOSIS — M79672 Pain in left foot: Secondary | ICD-10-CM | POA: Diagnosis not present

## 2018-08-27 DIAGNOSIS — Z113 Encounter for screening for infections with a predominantly sexual mode of transmission: Secondary | ICD-10-CM | POA: Insufficient documentation

## 2018-08-27 DIAGNOSIS — F319 Bipolar disorder, unspecified: Secondary | ICD-10-CM

## 2018-08-27 DIAGNOSIS — R0683 Snoring: Secondary | ICD-10-CM | POA: Diagnosis not present

## 2018-08-27 DIAGNOSIS — F1010 Alcohol abuse, uncomplicated: Secondary | ICD-10-CM

## 2018-08-27 DIAGNOSIS — R519 Headache, unspecified: Secondary | ICD-10-CM

## 2018-08-27 LAB — URIC ACID: Uric Acid, Serum: 5.2 mg/dL (ref 2.4–7.0)

## 2018-08-27 MED ORDER — ARIPIPRAZOLE 5 MG PO TABS: ORAL_TABLET | ORAL | 1 refills | Status: DC

## 2018-08-27 MED ORDER — LAMOTRIGINE 150 MG PO TABS: ORAL_TABLET | ORAL | 1 refills | Status: DC

## 2018-08-27 MED ORDER — ONDANSETRON HCL 4 MG PO TABS: 4.0000 mg | ORAL_TABLET | Freq: Three times a day (TID) | ORAL | 0 refills | Status: DC | PRN

## 2018-08-27 MED ORDER — HYDROXYZINE PAMOATE 25 MG PO CAPS: 25.0000 mg | ORAL_CAPSULE | Freq: Three times a day (TID) | ORAL | 1 refills | Status: DC | PRN

## 2018-08-27 MED ORDER — KETOROLAC TROMETHAMINE 60 MG/2ML IM SOLN
60.0000 mg | Freq: Once | INTRAMUSCULAR | Status: AC
  Administered 2018-08-27: 14:00:00 60 mg via INTRAMUSCULAR
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
## 2018-08-27 NOTE — Progress Notes (Signed)
Chief Complaint  Patient presents with  . Headache  . Edema    feet mostly left    Anselm Pancoast here for bilateral foot swelling.  Duration: 3 months Hx of prolonged bedrest, recent surgery, travel or injury? No Pain the calf? No SOB? No Personal or family history of clot or bleeding disorder? No Hx of heart failure, renal failure, hepatic failure? No  Snores at night, will wake herself up gasping for air and sob. Has been having Ha's. Has never had sleep study.   +Ha for 2 d, frontal. Typical migraine for her. Sensitive to sound and is nauseated. No other neuro s/s's.   ROS:  MSK- +leg swelling, no pain Lungs- no SOB  Past Medical History:  Diagnosis Date  . Anxiety   . Bipolar 1 disorder ([place])   . PTSD (post-traumatic stress disorder)    Family History  Problem Relation Age of Onset  . Diabetes Father   . Cirrhosis Father    Past Surgical History:  Procedure Laterality Date  . ABDOMINAL HYSTERECTOMY     Fibroids/heavy menses    Current Outpatient Medications:  .  albuterol (PROVENTIL HFA;VENTOLIN HFA) 108 (90 Base) MCG/ACT inhaler, Inhale 2 puffs into the lungs every 6 (six) hours as needed for wheezing or shortness of breath., Disp: 1 Inhaler, Rfl: 2 .  ARIPiprazole (ABILIFY) 5 MG tablet, Take 1/2 tab daily for one week and than full tab daily, Disp: 30 tablet, Rfl: 1 .  hydrOXYzine (VISTARIL) 25 MG capsule, Take 1 capsule (25 mg total) by mouth 3 (three) times daily as needed for anxiety., Disp: 60 capsule, Rfl: 1 .  lamoTRIgine (LAMICTAL) 150 MG tablet, Take one tab daily, Disp: 30 tablet, Rfl: 1 .  ondansetron (ZOFRAN) 4 MG tablet, Take 1 tablet (4 mg total) by mouth every 8 (eight) hours as needed., Disp: 20 tablet, Rfl: 0  BP 122/82 (BP Location: Left Arm, Patient Position: Sitting, Cuff Size: Large)   Pulse 95   Temp (!) 97.3 F (36.3 C) (Axillary)   Ht 5\' 6"  (1.676 m)   Wt 214 lb (97.1 kg)   SpO2 97%   BMI 34.54 kg/m  Gen- awake, alert, appears  stated age Heart- RRR, no murmurs, +LE edema over L foot, nonpitting, to a lesser extent over dorsum of R foot Lungs- CTAB, normal effort w/o accessory muscle use MSK- no calf pain, +TTP over dorsum of feet b/l, worse on L Psych: Age appropriate judgment and insight  Snoring - Plan: Ambulatory referral to Pulmonology for OSA eval  Left foot pain - Plan: Uric acid, if no improvement with shot, will trial pred. Podiatry if no improvement.  Acute nonintractable headache, unspecified headache type - Plan: ketorolac (TORADOL) injection 60 mg, could be related to either stress or OSA.  Orders as above. F/u prn. Pt voiced understanding and agreement to the plan.  [place], DO 08/27/18  2:00 PM

## 2018-08-27 NOTE — Telephone Encounter (Signed)
D:  Pt was referred per Dr. Adele Schilder for Deanna Powell.  A:  Placed call to orient and provide pt with a start date.  Pt states she would like to contact her insurance company to verify her benefits first.  Encouraged pt to Secondary school teacher once she calls her insurance company.  Inform Dr. Adele Schilder.  R:  Pt receptive.

## 2018-08-27 NOTE — Patient Instructions (Signed)
Give [place] 2-3 business days to get the results of your labs back.   Keep the diet clean and stay active.  If your foot isn't better by tomorrow, let me know via MyChart. Wear supportive shoes. Consider arch support. We will send in another medicine.   Let us know if you need anything.

## 2018-08-28 LAB — URINE CYTOLOGY ANCILLARY ONLY
Chlamydia: NEGATIVE
Neisseria Gonorrhea: NEGATIVE
Trichomonas: NEGATIVE

## 2018-08-30 LAB — URINE CYTOLOGY ANCILLARY ONLY

## 2018-08-31 ENCOUNTER — Other Ambulatory Visit: Payer: Self-pay | Admitting: Family Medicine

## 2018-08-31 MED ORDER — METRONIDAZOLE 500 MG PO TABS: 500.0000 mg | ORAL_TABLET | Freq: Two times a day (BID) | ORAL | 0 refills | Status: AC

## 2018-09-02 ENCOUNTER — Telehealth: Payer: Self-pay | Admitting: Family Medicine

## 2018-09-02 NOTE — Telephone Encounter (Signed)
Patient informed. 

## 2018-09-02 NOTE — Telephone Encounter (Signed)
Consider another type of topical like Rephresh (OTC). Ty.

## 2018-09-02 NOTE — Telephone Encounter (Signed)
Patient informed of results/instructions. She wanted to know what to do to help not get BV. She has been douching more than usual

## 2018-09-02 NOTE — Telephone Encounter (Signed)
Copied from [place] [id number]. Topic: General - Other >> Sep 02, 2018  9:28 AM Antonieta Iba C wrote: Reason for CRM: pt called in to request a call back to discuss her lab results.

## 2018-10-25 ENCOUNTER — Telehealth (HOSPITAL_COMMUNITY): Payer: Self-pay | Admitting: Psychiatry

## 2018-10-25 ENCOUNTER — Encounter (HOSPITAL_COMMUNITY): Payer: Self-pay | Admitting: Psychiatry

## 2018-10-25 ENCOUNTER — Ambulatory Visit (INDEPENDENT_AMBULATORY_CARE_PROVIDER_SITE_OTHER): Payer: BC Managed Care – PPO | Admitting: Psychiatry

## 2018-10-25 ENCOUNTER — Other Ambulatory Visit: Payer: Self-pay

## 2018-10-25 DIAGNOSIS — F4310 Post-traumatic stress disorder, unspecified: Secondary | ICD-10-CM

## 2018-10-25 DIAGNOSIS — F1010 Alcohol abuse, uncomplicated: Secondary | ICD-10-CM

## 2018-10-25 DIAGNOSIS — F319 Bipolar disorder, unspecified: Secondary | ICD-10-CM

## 2018-10-25 MED ORDER — HYDROXYZINE PAMOATE 25 MG PO CAPS: 25.0000 mg | ORAL_CAPSULE | Freq: Four times a day (QID) | ORAL | 1 refills | Status: DC | PRN

## 2018-10-25 MED ORDER — LAMOTRIGINE 150 MG PO TABS: ORAL_TABLET | ORAL | 1 refills | Status: DC

## 2018-10-25 MED ORDER — ARIPIPRAZOLE 10 MG PO TABS: 10.0000 mg | ORAL_TABLET | Freq: Every day | ORAL | 1 refills | Status: DC
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
## 2018-10-25 NOTE — Telephone Encounter (Signed)
D:  Dr. Adele Schilder referred pt again to [place].  Case mgr had oriented pt back in August 2020, but pt stated she wanted to verify her benefits b/c she didn't think she could afford the program.  A:  Placed call to pt again to orient and provide start date.  Pt states she needed to contact her insurance company first.  Provided pt with name of program along with CPT code [phone]) that insurance co may ask for.  Instructed pt to call case manager back once she verifies her benefits.  Inform Dr. Adele Schilder.  R:  Pt receptive.

## 2018-12-10 ENCOUNTER — Other Ambulatory Visit: Payer: Self-pay

## 2018-12-10 ENCOUNTER — Encounter (HOSPITAL_COMMUNITY): Payer: Self-pay | Admitting: Psychiatry

## 2018-12-10 ENCOUNTER — Ambulatory Visit (INDEPENDENT_AMBULATORY_CARE_PROVIDER_SITE_OTHER): Payer: BC Managed Care – PPO | Admitting: Psychiatry

## 2018-12-10 DIAGNOSIS — F1010 Alcohol abuse, uncomplicated: Secondary | ICD-10-CM

## 2018-12-10 DIAGNOSIS — F319 Bipolar disorder, unspecified: Secondary | ICD-10-CM

## 2018-12-10 DIAGNOSIS — F4310 Post-traumatic stress disorder, unspecified: Secondary | ICD-10-CM

## 2018-12-10 MED ORDER — HYDROXYZINE PAMOATE 25 MG PO CAPS: 25.0000 mg | ORAL_CAPSULE | Freq: Four times a day (QID) | ORAL | 1 refills | Status: DC | PRN

## 2018-12-10 MED ORDER — ARIPIPRAZOLE 10 MG PO TABS: 10.0000 mg | ORAL_TABLET | Freq: Every day | ORAL | 1 refills | Status: DC

## 2018-12-10 MED ORDER — LAMOTRIGINE 200 MG PO TABS: ORAL_TABLET | ORAL | 1 refills | Status: DC
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
## 2019-02-10 ENCOUNTER — Ambulatory Visit (HOSPITAL_COMMUNITY): Payer: BC Managed Care – PPO | Admitting: Psychiatry

## 2019-02-10 ENCOUNTER — Other Ambulatory Visit: Payer: Self-pay

## 2019-04-09 ENCOUNTER — Other Ambulatory Visit: Payer: Self-pay

## 2019-04-09 ENCOUNTER — Ambulatory Visit (INDEPENDENT_AMBULATORY_CARE_PROVIDER_SITE_OTHER): Payer: BC Managed Care – PPO | Admitting: Psychiatry

## 2019-04-09 ENCOUNTER — Encounter (HOSPITAL_COMMUNITY): Payer: Self-pay | Admitting: Psychiatry

## 2019-04-09 DIAGNOSIS — F4310 Post-traumatic stress disorder, unspecified: Secondary | ICD-10-CM | POA: Diagnosis not present

## 2019-04-09 DIAGNOSIS — F1010 Alcohol abuse, uncomplicated: Secondary | ICD-10-CM | POA: Diagnosis not present

## 2019-04-09 DIAGNOSIS — F319 Bipolar disorder, unspecified: Secondary | ICD-10-CM

## 2019-04-09 MED ORDER — ARIPIPRAZOLE 10 MG PO TABS: 10.0000 mg | ORAL_TABLET | Freq: Every day | ORAL | 1 refills | Status: DC

## 2019-04-09 MED ORDER — HYDROXYZINE PAMOATE 25 MG PO CAPS: 25.0000 mg | ORAL_CAPSULE | Freq: Four times a day (QID) | ORAL | 1 refills | Status: DC | PRN

## 2019-04-09 MED ORDER — LAMOTRIGINE 200 MG PO TABS: ORAL_TABLET | ORAL | 1 refills | Status: DC
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
## 2019-04-24 ENCOUNTER — Telehealth (HOSPITAL_COMMUNITY): Payer: Self-pay

## 2019-04-24 NOTE — Telephone Encounter (Signed)
She can try 4 th tab of hydroxyzine at night.

## 2019-04-24 NOTE — Telephone Encounter (Signed)
Spoke with patient and she stated that she's not sleeping well and would like something prescribed. Please review and advise. Thank you.

## 2019-05-05 NOTE — Telephone Encounter (Signed)
I have tried to call patient several times at both numbers to relay message from the doctor. I cannot LVM due to mailbox not being set up

## 2019-06-05 ENCOUNTER — Other Ambulatory Visit: Payer: Self-pay

## 2019-06-05 ENCOUNTER — Encounter (HOSPITAL_COMMUNITY): Payer: Self-pay | Admitting: Psychiatry

## 2019-06-05 ENCOUNTER — Telehealth (INDEPENDENT_AMBULATORY_CARE_PROVIDER_SITE_OTHER): Payer: BC Managed Care – PPO | Admitting: Psychiatry

## 2019-06-05 VITALS — Wt 220.0 lb

## 2019-06-05 DIAGNOSIS — F1010 Alcohol abuse, uncomplicated: Secondary | ICD-10-CM | POA: Diagnosis not present

## 2019-06-05 DIAGNOSIS — F319 Bipolar disorder, unspecified: Secondary | ICD-10-CM | POA: Diagnosis not present

## 2019-06-05 DIAGNOSIS — F4310 Post-traumatic stress disorder, unspecified: Secondary | ICD-10-CM | POA: Diagnosis not present

## 2019-06-05 MED ORDER — LAMOTRIGINE 200 MG PO TABS: ORAL_TABLET | ORAL | 1 refills | Status: DC

## 2019-06-05 MED ORDER — HYDROXYZINE PAMOATE 25 MG PO CAPS: 25.0000 mg | ORAL_CAPSULE | Freq: Four times a day (QID) | ORAL | 1 refills | Status: DC | PRN

## 2019-06-05 MED ORDER — NALTREXONE HCL 50 MG PO TABS: 50.0000 mg | ORAL_TABLET | Freq: Every day | ORAL | 1 refills | Status: DC

## 2019-06-05 MED ORDER — ARIPIPRAZOLE 15 MG PO TABS: 15.0000 mg | ORAL_TABLET | Freq: Every day | ORAL | 1 refills | Status: DC
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
## 2019-07-24 ENCOUNTER — Other Ambulatory Visit: Payer: Self-pay

## 2019-07-24 ENCOUNTER — Telehealth (INDEPENDENT_AMBULATORY_CARE_PROVIDER_SITE_OTHER): Payer: BC Managed Care – PPO | Admitting: Psychiatry

## 2019-07-24 ENCOUNTER — Encounter (HOSPITAL_COMMUNITY): Payer: Self-pay | Admitting: Psychiatry

## 2019-07-24 VITALS — Wt 227.0 lb

## 2019-07-24 DIAGNOSIS — F319 Bipolar disorder, unspecified: Secondary | ICD-10-CM | POA: Diagnosis not present

## 2019-07-24 DIAGNOSIS — F4310 Post-traumatic stress disorder, unspecified: Secondary | ICD-10-CM

## 2019-07-24 DIAGNOSIS — F1010 Alcohol abuse, uncomplicated: Secondary | ICD-10-CM | POA: Diagnosis not present

## 2019-07-24 MED ORDER — ARIPIPRAZOLE 15 MG PO TABS: 7.5000 mg | ORAL_TABLET | Freq: Every day | ORAL | 0 refills | Status: DC

## 2019-07-24 MED ORDER — ZIPRASIDONE HCL 40 MG PO CAPS: 40.0000 mg | ORAL_CAPSULE | Freq: Every day | ORAL | 1 refills | Status: DC

## 2019-07-24 MED ORDER — LAMOTRIGINE 200 MG PO TABS: ORAL_TABLET | ORAL | 1 refills | Status: DC

## 2019-07-24 MED ORDER — HYDROXYZINE PAMOATE 25 MG PO CAPS: ORAL_CAPSULE | ORAL | 1 refills | Status: DC
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
## 2019-09-08 ENCOUNTER — Other Ambulatory Visit: Payer: Self-pay

## 2019-09-08 ENCOUNTER — Telehealth (HOSPITAL_COMMUNITY): Payer: BC Managed Care – PPO | Admitting: Psychiatry

## 2019-10-02 ENCOUNTER — Telehealth (HOSPITAL_COMMUNITY): Payer: Self-pay | Admitting: Psychiatry

## 2019-10-02 ENCOUNTER — Other Ambulatory Visit: Payer: Self-pay

## 2019-10-02 ENCOUNTER — Telehealth (INDEPENDENT_AMBULATORY_CARE_PROVIDER_SITE_OTHER): Payer: BC Managed Care – PPO | Admitting: Psychiatry

## 2019-10-02 ENCOUNTER — Encounter (HOSPITAL_COMMUNITY): Payer: Self-pay | Admitting: Psychiatry

## 2019-10-02 VITALS — Wt 220.0 lb

## 2019-10-02 DIAGNOSIS — F4321 Adjustment disorder with depressed mood: Secondary | ICD-10-CM | POA: Diagnosis not present

## 2019-10-02 DIAGNOSIS — F4310 Post-traumatic stress disorder, unspecified: Secondary | ICD-10-CM

## 2019-10-02 DIAGNOSIS — F1010 Alcohol abuse, uncomplicated: Secondary | ICD-10-CM | POA: Diagnosis not present

## 2019-10-02 DIAGNOSIS — F319 Bipolar disorder, unspecified: Secondary | ICD-10-CM

## 2019-10-02 MED ORDER — NALTREXONE HCL 50 MG PO TABS: 50.0000 mg | ORAL_TABLET | Freq: Every day | ORAL | 0 refills | Status: DC

## 2019-10-02 MED ORDER — LAMOTRIGINE 200 MG PO TABS: ORAL_TABLET | ORAL | 0 refills | Status: DC

## 2019-10-02 MED ORDER — HYDROXYZINE PAMOATE 25 MG PO CAPS: ORAL_CAPSULE | ORAL | 0 refills | Status: DC

## 2019-10-02 MED ORDER — ZIPRASIDONE HCL 60 MG PO CAPS: 60.0000 mg | ORAL_CAPSULE | Freq: Every day | ORAL | 0 refills | Status: DC

## 2019-10-02 NOTE — Telephone Encounter (Signed)
D:  Dr. Lolly Mustache referred pt to [facility] again.  A:  Re-oriented pt again.  Inquired if patient ever contacted her insurance before.  Pt states she couldn't get back in contact with anyone here at the office.  Pt continues to report that she cannot afford [facility].  "I only get paid $300 something per week and there's no way that I can afford your program.  Is there anything else?"  Case manager discussed [facility] with pt.  Recommended pt to call her insurance company to verify her benefits before making her decision about [facility]-I[facility].  Pt inquired about FMLA.  "Dr. Lolly Mustache said that he would complete FMLA for me, what is your fax number?"  Expressed to patient that Dr. Lolly Mustache wanted her to attend [facility].  Case manager will await phone call from pt.  Inform Dr. Lolly Mustache.
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
## 2019-10-20 ENCOUNTER — Other Ambulatory Visit: Payer: Self-pay

## 2019-10-20 ENCOUNTER — Telehealth (INDEPENDENT_AMBULATORY_CARE_PROVIDER_SITE_OTHER): Payer: BC Managed Care – PPO | Admitting: Medical

## 2019-10-20 VITALS — BP 130/80 | HR 84 | Temp 98.2°F | Resp 18

## 2019-10-20 DIAGNOSIS — R059 Cough, unspecified: Secondary | ICD-10-CM | POA: Diagnosis not present

## 2019-10-20 DIAGNOSIS — R062 Wheezing: Secondary | ICD-10-CM

## 2019-10-20 MED ORDER — BENZONATATE 100 MG PO CAPS: 100.0000 mg | ORAL_CAPSULE | Freq: Three times a day (TID) | ORAL | 0 refills | Status: DC | PRN

## 2019-10-20 MED ORDER — FLOVENT HFA 110 MCG/ACT IN AERO: 2.0000 | INHALATION_SPRAY | Freq: Two times a day (BID) | RESPIRATORY_TRACT | 0 refills | Status: DC

## 2019-10-20 MED ORDER — ALBUTEROL SULFATE HFA 108 (90 BASE) MCG/ACT IN AERS: 2.0000 | INHALATION_SPRAY | Freq: Four times a day (QID) | RESPIRATORY_TRACT | 0 refills | Status: DC | PRN

## 2019-10-20 NOTE — Progress Notes (Addendum)
   Subjective:    Patient ID: Deanna Powell, female    DOB: 12-09-1980, 39 y.o.   MRN: [id number]  HPI  Virtual Visit via Video Note  I connected with Deanna Powell on 10/20/19 at  3:00 PM EDT by a video enabled telemedicine application and verified that I am speaking with the correct person using two identifiers.  Location: Patient: car Provider: office/radiology.  Participants- pt and myself. Dahlia Client MA also present.    I discussed the limitations of evaluation and management by telemedicine and the availability of in person appointments. The patient expressed understanding and agreed to proceed.  History of Present Illness:   Pt had cough since Thursday night. No fever, no chills. No sweats or bodyaches.   Pt has normal sense of smell and no taste issues. Pt is wheezing. Sporadic wheezing.  Pt had some wheezing similar in 2019. Cough is non prodcutive.  Pt has not been vaccinated against covid. Pt sister passed away from covid at age 49 yo.   Observations/Objective: General- No acute distress. Pleasant patient. Neck- Full range of motion, no jvd Lungs- Clear, even and unlabored. Heart- regular rate and rhythm. Neurologic- CNII- XII grossly intact. heent- no sinus pressure.   Assessment and Plan: Recent cough with wheezing. Hx of Reactive airways. Lungs sound clear on exam.  Will rx flovent and albuterol inhaler. Benzonatate for cough.  Covid test done today. Work note sent to my chart.  If signs and symptoms worsen or change notify us.  Follow up 7 days or as needed  Follow Up Instructions:    I discussed the assessment and treatment plan with the patient. The patient was provided an opportunity to ask questions and all were answered. The patient agreed with the plan and demonstrated an understanding of the instructions.   The patient was advised to call back or seek an in-person evaluation if the symptoms worsen or if the condition fails to improve as  anticipated.  I provided 30 minutes of non-face-to-face time during this encounter.   Esperanza Richters, PA-C    Review of Systems  Constitutional: Negative for chills and fever.  HENT: Negative for congestion, ear pain, postnasal drip, sinus pressure and sinus pain.   Respiratory: Positive for cough and wheezing. Negative for chest tightness and shortness of breath.   Cardiovascular: Negative for chest pain and palpitations.  Gastrointestinal: Negative for abdominal pain.  Musculoskeletal: Negative for back pain, myalgias and neck stiffness.  Neurological: Negative for dizziness.  Hematological: Negative for adenopathy. Does not bruise/bleed easily.  Psychiatric/Behavioral: Negative for behavioral problems.       Objective:   Physical Exam        Assessment & Plan:

## 2019-10-20 NOTE — Addendum Note (Signed)
Addended by: Gwenevere Abbot on: 10/20/2019 06:46 PM   Modules accepted: Level of Service

## 2019-10-20 NOTE — Patient Instructions (Signed)
Recent cough with wheezing. Hx of Reactive airways. Lungs sound clear on exam.  Will rx flovent and albuterol inhaler. Benzonatate for cough.  Covid test done today. Work note sent to my chart.  If signs and symptoms worsen or change notify us.  Follow up 7 days or as needed

## 2019-10-22 LAB — SARS-COV-2, NAA 2 DAY TAT

## 2019-10-22 LAB — NOVEL CORONAVIRUS, NAA: SARS-CoV-2, NAA: NOT DETECTED

## 2019-10-27 ENCOUNTER — Telehealth: Payer: Self-pay | Admitting: Family Medicine

## 2019-10-27 DIAGNOSIS — R059 Cough, unspecified: Secondary | ICD-10-CM

## 2019-10-27 NOTE — Telephone Encounter (Signed)
Would recommend pt get chest xray tomorrow. Then decide if return to work on 10/28/2019. Make sure she does not have walking pneumonia.   Is she still wheezing and coughing a lot?  What symptoms does she have would like to know extent of her symptoms before writing return to work not.  Is she improved with just residual cough. Or is she virtually the exact same?

## 2019-10-27 NOTE — Telephone Encounter (Signed)
Caller : Deanna Powell  Call Back # [phone]   Patient seen Deanna Powell on 10/20/2019. Patient states she needs a note to return to work post covid results. Patient state she is not feeling better today however but would like to return to work tomorrow.    Please Advise

## 2019-10-28 NOTE — Telephone Encounter (Signed)
Pt called and unable to lvm 

## 2019-10-28 NOTE — Telephone Encounter (Signed)
Pt called and someone answered and said she was unavailable to talk right now

## 2020-03-27 ENCOUNTER — Telehealth (INDEPENDENT_AMBULATORY_CARE_PROVIDER_SITE_OTHER): Payer: BC Managed Care – PPO | Admitting: Family Medicine

## 2020-03-27 ENCOUNTER — Encounter: Payer: Self-pay | Admitting: Family Medicine

## 2020-03-27 DIAGNOSIS — U071 COVID-19: Secondary | ICD-10-CM | POA: Insufficient documentation

## 2020-03-27 MED ORDER — BENZONATATE 100 MG PO CAPS: 100.0000 mg | ORAL_CAPSULE | Freq: Three times a day (TID) | ORAL | 0 refills | Status: DC | PRN

## 2020-03-27 MED ORDER — ALBUTEROL SULFATE HFA 108 (90 BASE) MCG/ACT IN AERS: 2.0000 | INHALATION_SPRAY | Freq: Four times a day (QID) | RESPIRATORY_TRACT | 0 refills | Status: DC | PRN

## 2020-03-27 NOTE — Assessment & Plan Note (Addendum)
Patient has COVID-19.  Symptoms have progressively been improving since the middle of this past week.  She does continue to have a persistent cough and some wheezing.  I will prescribe an albuterol inhaler and a cough suppressant medication.  Discussed that the cough suppressant could make her drowsy so if it makes her excessively drowsy she needs to let us know and discontinue the medication.  She will monitor her symptoms and if she develops worsening symptoms such as shortness of breath, chest pain, cough productive of blood, or elevated fevers she should be seen in person.  At this time with improving symptoms I do not believe a chest x-ray or antibiotics are warranted though if she does have any worsening symptoms or persistent symptoms she may need either of those things.  Work note sent to [employer].  Discussed that her quarantine would end on Monday though if her symptoms worsen at all she may need to quarantine for longer.

## 2020-03-27 NOTE — Progress Notes (Signed)
Virtual Visit via TelephoneNote  This visit type was conducted due to national recommendations for restrictions regarding the COVID-19 pandemic (e.g. social distancing).  This format is felt to be most appropriate for this patient at this time.  All issues noted in this document were discussed and addressed.  No physical exam was performed (except for noted visual exam findings with Video Visits).   I connected with Deanna Powell today at  9:00 AM EST by a video enabled telemedicine application or telephone and verified that I am speaking with the correct person using two identifiers. Location patient: home Location provider: work  Persons participating in the virtual visit: patient, provider  I discussed the limitations, risks, security and privacy concerns of performing an evaluation and management service by telephone and the availability of in person appointments. I also discussed with the patient that there may be a patient responsible charge related to this service. The patient expressed understanding and agreed to proceed.  Interactive audio and video telecommunications were attempted between this provider and patient, however failed, due to patient having technical difficulties OR patient did not have access to video capability.  We continued and completed visit with audio only.   Reason for visit: same day visit  HPI: Covid 32: Patient notes onset of symptoms on 03/18/2020.  Started with chills, body aches, lightheadedness, and loss of appetite.  She had T-max of 104 F.  She did have some shortness of breath with this initially.  She notes a dry persistent cough that gags her though is not getting anything up.  No sore throat.  No postnasal drip.  No taste or smell disturbances.  She did have diarrhea initially.  She had a positive Covid antigen test through a [facility].  She now notes that most of her symptoms have improved though she does still have fairly significant cough and  some wheezing.  She has not had any shortness of breath or fevers recently.  She overall feels better than she did though the cough is still bothersome.  She notes her work wanted her to come back to work on 03/23/2020 though she was unable to do so given how she felt.  She has tried TheraFlu and Mucinex with no benefit.  The patient has not been vaccinated against COVID-19.   ROS: See pertinent positives and negatives per HPI.  Past Medical History:  Diagnosis Date  . Anxiety   . Bipolar 1 disorder (HCC)   . PTSD (post-traumatic stress disorder)     Past Surgical History:  Procedure Laterality Date  . ABDOMINAL HYSTERECTOMY     Fibroids/heavy menses    Family History  Problem Relation Age of Onset  . Diabetes Father   . Cirrhosis Father     SOCIAL HX: Non-smoker   Current Outpatient Medications:  .  albuterol (VENTOLIN HFA) 108 (90 Base) MCG/ACT inhaler, Inhale 2 puffs into the lungs every 6 (six) hours as needed for wheezing or shortness of breath., Disp: 8 g, Rfl: 0 .  fluticasone (FLOVENT HFA) 110 MCG/ACT inhaler, Inhale 2 puffs into the lungs 2 (two) times daily., Disp: 1 each, Rfl: 0 .  hydrOXYzine (VISTARIL) 25 MG capsule, Take one capsule in am and two at bed time, Disp: 90 capsule, Rfl: 0 .  lamoTRIgine (LAMICTAL) 200 MG tablet, Take one tab daily, Disp: 30 tablet, Rfl: 0 .  naltrexone (DEPADE) 50 MG tablet, Take 1 tablet (50 mg total) by mouth daily., Disp: 30 tablet, Rfl: 0 .  ondansetron (ZOFRAN) 4 MG tablet, Take 1 tablet (4 mg total) by mouth every 8 (eight) hours as needed., Disp: 20 tablet, Rfl: 0 .  ziprasidone (GEODON) 60 MG capsule, Take 1 capsule (60 mg total) by mouth at bedtime., Disp: 30 capsule, Rfl: 0 .  benzonatate (TESSALON) 100 MG capsule, Take 1 capsule (100 mg total) by mouth 3 (three) times daily as needed for cough., Disp: 30 capsule, Rfl: 0  EXAM: This was a telephone visit and thus no physical exam was completed.  ASSESSMENT AND  PLAN:  Discussed the following assessment and plan:  Problem List Items Addressed This Visit    COVID-19    Patient has COVID-19.  Symptoms have progressively been improving since the middle of this past week.  She does continue to have a persistent cough and some wheezing.  I will prescribe an albuterol inhaler and a cough suppressant medication.  Discussed that the cough suppressant could make her drowsy so if it makes her excessively drowsy she needs to let us know and discontinue the medication.  She will monitor her symptoms and if she develops worsening symptoms such as shortness of breath, chest pain, cough productive of blood, or elevated fevers she should be seen in person.  At this time with improving symptoms I do not believe a chest x-ray or antibiotics are warranted though if she does have any worsening symptoms or persistent symptoms she may need either of those things.  Work note sent to [employer].  Discussed that her quarantine would end on Monday though if her symptoms worsen at all she may need to quarantine for longer.      Relevant Medications   albuterol (VENTOLIN HFA) 108 (90 Base) MCG/ACT inhaler   benzonatate (TESSALON) 100 MG capsule       I discussed the assessment and treatment plan with the patient. The patient was provided an opportunity to ask questions and all were answered. The patient agreed with the plan and demonstrated an understanding of the instructions.   The patient was advised to call back or seek an in-person evaluation if the symptoms worsen or if the condition fails to improve as anticipated.  I provided 12 minutes of non-face-to-face time during this encounter.   Marikay Alar, MD

## 2020-03-29 ENCOUNTER — Telehealth: Payer: Self-pay | Admitting: Family Medicine

## 2020-03-29 NOTE — Telephone Encounter (Signed)
Patient called in stated that Dr.Sonnenberg was suppose to put her a doctor note in [place] for work

## 2020-03-30 NOTE — Telephone Encounter (Signed)
Letter sent to patient.

## 2020-03-31 ENCOUNTER — Encounter: Payer: Self-pay | Admitting: Family Medicine

## 2020-03-31 ENCOUNTER — Other Ambulatory Visit: Payer: Self-pay

## 2020-03-31 ENCOUNTER — Telehealth (INDEPENDENT_AMBULATORY_CARE_PROVIDER_SITE_OTHER): Payer: BC Managed Care – PPO | Admitting: Family Medicine

## 2020-03-31 DIAGNOSIS — R0609 Other forms of dyspnea: Secondary | ICD-10-CM

## 2020-03-31 DIAGNOSIS — U099 Post covid-19 condition, unspecified: Secondary | ICD-10-CM | POA: Diagnosis not present

## 2020-03-31 MED ORDER — PROMETHAZINE-DM 6.25-15 MG/5ML PO SYRP: 5.0000 mL | ORAL_SOLUTION | Freq: Four times a day (QID) | ORAL | 0 refills | Status: DC | PRN

## 2020-03-31 MED ORDER — FLOVENT HFA 110 MCG/ACT IN AERO: 2.0000 | INHALATION_SPRAY | Freq: Two times a day (BID) | RESPIRATORY_TRACT | 1 refills | Status: DC

## 2020-03-31 MED ORDER — PREDNISONE 20 MG PO TABS: 40.0000 mg | ORAL_TABLET | Freq: Every day | ORAL | 0 refills | Status: AC

## 2020-03-31 NOTE — Progress Notes (Signed)
Chief Complaint  Patient presents with  . Cough    Cough started 2 weeks ago, tested positive for covid on 2/23, shortness of breath, loss of appetite, no fever    [name] here for URI complaints. Due to COVID-19 pandemic, we are interacting via web portal for an electronic face-to-face visit. I verified patient's ID using 2 identifiers. Patient agreed to proceed with visit via this method. Patient is at home, I am at office. Patient and I are present for visit.   Duration: 3 week  Associated symptoms: wheezing, shortness of breath and coughing Denies: sinus congestion, sinus pain, rhinorrhea, itchy watery eyes, ear pain, ear drainage, sore throat, myalgia, N/V/D and fevers Treatment to date: Dayquil, Mucinex, Albuterol, tessalon Perles Sick contacts: No  Caught covid on 2/23.   Past Medical History:  Diagnosis Date  . Anxiety   . Bipolar 1 disorder (HCC)   . PTSD (post-traumatic stress disorder)    Objective No conversational dyspnea Age appropriate judgment and insight Nml affect and mood  COVID-19 long hauler manifesting chronic dyspnea - Plan: predniSONE (DELTASONE) 20 MG tablet, promethazine-dextromethorphan (PROMETHAZINE-DM) 6.25-15 MG/5ML syrup, fluticasone (FLOVENT HFA) 110 MCG/ACT inhaler  5 d pred burest at 40 mg/d. Cough syrup as above. Flovent after she finishes prednisone.  Continue to push fluids, practice good hand hygiene, cover mouth when coughing. F/u prn. If starting to experience fevers, shaking, or worsening shortness of breath, seek immediate care. Pt voiced understanding and agreement to the plan.  Jilda Roche [place], DO 03/31/20 12:11 PM

## 2020-04-06 ENCOUNTER — Telehealth: Payer: Self-pay

## 2020-04-06 NOTE — Telephone Encounter (Signed)
Appt scheduled 04/07/20.

## 2020-04-06 NOTE — Telephone Encounter (Signed)
Caller states she has had COVID. She needs to schedule an appointment.

## 2020-04-06 NOTE — Telephone Encounter (Signed)
Sherri called patient lvm to call back to schedule appt

## 2020-04-07 ENCOUNTER — Encounter: Payer: Self-pay | Admitting: Family Medicine

## 2020-04-07 ENCOUNTER — Telehealth (INDEPENDENT_AMBULATORY_CARE_PROVIDER_SITE_OTHER): Payer: BC Managed Care – PPO | Admitting: Family Medicine

## 2020-04-07 ENCOUNTER — Other Ambulatory Visit: Payer: Self-pay

## 2020-04-07 DIAGNOSIS — U099 Post covid-19 condition, unspecified: Secondary | ICD-10-CM

## 2020-04-07 DIAGNOSIS — R0609 Other forms of dyspnea: Secondary | ICD-10-CM | POA: Diagnosis not present

## 2020-04-07 MED ORDER — BENZONATATE 100 MG PO CAPS: 100.0000 mg | ORAL_CAPSULE | Freq: Three times a day (TID) | ORAL | 0 refills | Status: DC | PRN

## 2020-04-07 NOTE — Progress Notes (Signed)
Chief Complaint  Patient presents with  . Form Completion    Needs work note to return to work    [employer] here for URI complaints. Due to COVID-19 pandemic, we are interacting via telephone. I verified patient's ID using 2 identifiers. Patient agreed to proceed with visit via this method. Patient is at home, I am at office. Patient and I are present for visit.   3 weeks of covid and sob/cough. Sob much better since starting Flovent. Has not been remembering to rinse mouth after use. Needs letter stating she may return to work tomorrow. Also will need to have STD forms filled out for work. Still having dry cough. Syrup helps, but makes her drowsy.   Past Medical History:  Diagnosis Date  . Anxiety   . Bipolar 1 disorder (HCC)   . PTSD (post-traumatic stress disorder)    Objective No conversational dyspnea Age appropriate judgment and insight Nml affect and mood  COVID-19 long hauler manifesting chronic dyspnea - Plan: benzonatate (TESSALON) 100 MG capsule  Tessalon perles for daytime use. Cont Phenergan-dextromethorphan as needed.  Continue Flovent, remember to rinse out mouth.  Letter for work stating return tomorrow provided on [employer].   3/3-3/23/22 for STD given Covid in long Covid symptoms. F/u prn. If starting to experience fevers, shaking, or shortness of breath, seek immediate care. Total time: 11 minutes Pt voiced understanding and agreement to the plan.  Jilda Roche [employer], DO 04/07/20 9:42 AM

## 2020-04-08 ENCOUNTER — Telehealth: Payer: Self-pay | Admitting: Family Medicine

## 2020-04-08 NOTE — Telephone Encounter (Signed)
Forms faxed into front office Placed in wendlings folder up front

## 2020-04-08 NOTE — Telephone Encounter (Signed)
Paperwork placed in provider's red folder for review

## 2020-04-09 NOTE — Telephone Encounter (Signed)
PCP completed and faxed

## 2020-10-16 ENCOUNTER — Emergency Department (HOSPITAL_COMMUNITY): Payer: BC Managed Care – PPO

## 2020-10-16 ENCOUNTER — Emergency Department (HOSPITAL_COMMUNITY)
Admission: EM | Admit: 2020-10-16 | Discharge: 2020-10-17 | Disposition: A | Discharge status: Home / Self Care | Payer: BC Managed Care – PPO | Attending: Emergency Medicine | Admitting: Emergency Medicine

## 2020-10-16 ENCOUNTER — Encounter (HOSPITAL_COMMUNITY): Payer: Self-pay | Admitting: *Deleted

## 2020-10-16 ENCOUNTER — Other Ambulatory Visit: Payer: Self-pay

## 2020-10-16 DIAGNOSIS — Z79899 Other long term (current) drug therapy: Secondary | ICD-10-CM | POA: Insufficient documentation

## 2020-10-16 DIAGNOSIS — I10 Essential (primary) hypertension: Secondary | ICD-10-CM | POA: Diagnosis not present

## 2020-10-16 DIAGNOSIS — R079 Chest pain, unspecified: Secondary | ICD-10-CM | POA: Diagnosis not present

## 2020-10-16 DIAGNOSIS — Z8616 Personal history of COVID-19: Secondary | ICD-10-CM | POA: Insufficient documentation

## 2020-10-16 DIAGNOSIS — R519 Headache, unspecified: Secondary | ICD-10-CM | POA: Diagnosis not present

## 2020-10-16 LAB — BASIC METABOLIC PANEL
Anion gap: 8 (ref 5–15)
BUN: 9 mg/dL (ref 6–20)
CO2: 25 mmol/L (ref 22–32)
Calcium: 9.3 mg/dL (ref 8.9–10.3)
Chloride: 105 mmol/L (ref 98–111)
Creatinine, Ser: 1 mg/dL (ref 0.44–1.00)
GFR, Estimated: >60 mL/min (ref >60)
Glucose, Bld: 95 mg/dL (ref 70–99)
Potassium: 3.8 mmol/L (ref 3.5–5.1)
Sodium: 138 mmol/L (ref 135–145)

## 2020-10-16 LAB — CBC
HCT: 38.7 % (ref 36.0–46.0)
Hemoglobin: 11.6 g/dL — ABNORMAL LOW (ref 12.0–15.0)
MCH: 23.2 pg — ABNORMAL LOW (ref 26.0–34.0)
MCHC: 30 g/dL (ref 30.0–36.0)
MCV: 77.6 fL — ABNORMAL LOW (ref 80.0–100.0)
Platelets: 296 10*3/uL (ref 150–400)
RBC: 4.99 MIL/uL (ref 3.87–5.11)
RDW: 15.6 % — ABNORMAL HIGH (ref 11.5–15.5)
WBC: 6 10*3/uL (ref 4.0–10.5)
nRBC: 0 % (ref 0.0–0.2)

## 2020-10-16 LAB — I-STAT BETA HCG BLOOD, ED (MC, WL, AP ONLY): I-stat hCG, quantitative: <5 m[IU]/mL (ref <5)

## 2020-10-16 LAB — TROPONIN I (HIGH SENSITIVITY): Troponin I (High Sensitivity): 3 ng/L (ref <18)

## 2020-10-16 NOTE — ED Triage Notes (Signed)
Headache  and high bp for 2 days  she does not take med for bp  lmp none hys

## 2020-10-17 DIAGNOSIS — I10 Essential (primary) hypertension: Secondary | ICD-10-CM | POA: Diagnosis not present

## 2020-10-17 DIAGNOSIS — R519 Headache, unspecified: Secondary | ICD-10-CM | POA: Diagnosis not present

## 2020-10-17 LAB — TROPONIN I (HIGH SENSITIVITY): Troponin I (High Sensitivity): 3 ng/L (ref <18)

## 2020-10-17 MED ORDER — KETOROLAC TROMETHAMINE 30 MG/ML IJ SOLN
30.0000 mg | Freq: Once | INTRAMUSCULAR | Status: AC
  Administered 2020-10-17: 30 mg via INTRAVENOUS
  Filled 2020-10-17: qty 1

## 2020-10-17 MED ORDER — AMLODIPINE BESYLATE 5 MG PO TABS
5.0000 mg | ORAL_TABLET | Freq: Once | ORAL | Status: AC
  Administered 2020-10-17: 5 mg via ORAL
  Filled 2020-10-17: qty 1

## 2020-10-17 MED ORDER — AMLODIPINE BESYLATE 5 MG PO TABS: 5.0000 mg | ORAL_TABLET | Freq: Every day | ORAL | 0 refills | Status: DC

## 2020-10-17 MED ORDER — PROCHLORPERAZINE EDISYLATE 10 MG/2ML IJ SOLN
10.0000 mg | Freq: Once | INTRAMUSCULAR | Status: AC
  Administered 2020-10-17: 10 mg via INTRAVENOUS
  Filled 2020-10-17: qty 2

## 2020-10-17 NOTE — Discharge Instructions (Signed)
Start taking the blood pressure medications as prescribed.  Follow-up with your primary care doctor to have your blood pressure rechecked.  Return as needed for fevers chills, numbness, weakness worsening symptoms

## 2020-10-17 NOTE — ED Provider Notes (Signed)
[facility] EMERGENCY DEPARTMENT Provider Note   CSN: [id number] Arrival date & time: 10/16/20  2151     History Chief Complaint  Patient presents with  . Headache    Deanna Powell is a 40 y.o. female.   Headache  Patient presents to the ED for evaluation of headache and hypertension.  Patient states for the last couple days she has had a throbbing headache in the front of her head.  Symptoms have been gradual in onset.  No head injury.  No neck pain or neck stiffness.  No fevers or chills.  Patient also has been monitoring her blood pressure and has noticed that it has been elevated up to 150s 160s.  She is also had diastolics above the 90s.  Patient does not have high blood pressure that she is aware of.  She however has not seen a primary doctor for checkup in years.  She also developed some discomfort in her chest yesterday.  She also noticed some tingling in her extremities.  This concerned her so she came to the ED for evaluation  Past Medical History:  Diagnosis Date  . Anxiety   . Bipolar 1 disorder (HCC)   . PTSD (post-traumatic stress disorder)     Patient Active Problem List   Diagnosis Date Noted  . COVID-19 03/27/2020  . Bipolar disorder (HCC) 06/28/2016  . Episodic mood disorder (HCC) 01/06/2015    Past Surgical History:  Procedure Laterality Date  . ABDOMINAL HYSTERECTOMY     Fibroids/heavy menses     OB History   No obstetric history on file.     Family History  Problem Relation Age of Onset  . Diabetes Father   . Cirrhosis Father     Social History   Tobacco Use  . Smoking status: Never  . Smokeless tobacco: Never  Vaping Use  . Vaping Use: Never used  Substance Use Topics  . Alcohol use: No    Comment: casual  . Drug use: No    Home Medications Prior to Admission medications   Medication Sig Start Date End Date Taking? Authorizing Provider  amLODipine (NORVASC) 5 MG tablet Take 1 tablet (5 mg total) by mouth  daily. 10/17/20 11/16/20 Yes Linwood Dibbles, MD  benzonatate (TESSALON) 100 MG capsule Take 1 capsule (100 mg total) by mouth 3 (three) times daily as needed. 04/07/20   [name], DO  fluticasone (FLOVENT HFA) 110 MCG/ACT inhaler Inhale 2 puffs into the lungs in the morning and at bedtime. Rinse mouth out after use. 03/31/20   [name], DO  hydrOXYzine (VISTARIL) 25 MG capsule Take one capsule in am and two at bed time 10/02/19   Arfeen, Phillips Grout, MD  lamoTRIgine (LAMICTAL) 200 MG tablet Take one tab daily 10/02/19   Arfeen, Phillips Grout, MD  naltrexone (DEPADE) 50 MG tablet Take 1 tablet (50 mg total) by mouth daily. 10/02/19   Arfeen, Phillips Grout, MD  ondansetron (ZOFRAN) 4 MG tablet Take 1 tablet (4 mg total) by mouth every 8 (eight) hours as needed. 08/27/18   [name], DO  promethazine-dextromethorphan (PROMETHAZINE-DM) 6.25-15 MG/5ML syrup Take 5 mLs by mouth 4 (four) times daily as needed for cough. 03/31/20   [name], DO  ziprasidone (GEODON) 60 MG capsule Take 1 capsule (60 mg total) by mouth at bedtime. 10/02/19   Arfeen, Phillips Grout, MD    Allergies    Patient has no known allergies.  Review of Systems  Review of Systems  Neurological:  Positive for headaches.  All other systems reviewed and are negative.  Physical Exam Updated Vital Signs BP (!) 134/92   Pulse 75   Temp 98.6 F (37 C) (Oral)   Resp (!) 31   Ht 1.676 m (5\' 6" )   Wt 97.1 kg   SpO2 100%   BMI 34.55 kg/m   Physical Exam Vitals and nursing note reviewed.  Constitutional:      General: She is not in acute distress.    Appearance: She is well-developed.  HENT:     Head: Normocephalic and atraumatic.     Right Ear: External ear normal.     Left Ear: External ear normal.  Eyes:     General: No scleral icterus.       Right eye: No discharge.        Left eye: No discharge.     Conjunctiva/sclera: Conjunctivae normal.  Neck:     Trachea: No tracheal deviation.   Cardiovascular:     Rate and Rhythm: Normal rate and regular rhythm.  Pulmonary:     Effort: Pulmonary effort is normal. No respiratory distress.     Breath sounds: Normal breath sounds. No stridor. No wheezing or rales.  Abdominal:     General: Bowel sounds are normal. There is no distension.     Palpations: Abdomen is soft.     Tenderness: There is no abdominal tenderness. There is no guarding or rebound.  Musculoskeletal:        General: No tenderness or deformity.     Cervical back: Neck supple.  Skin:    General: Skin is warm and dry.     Findings: No rash.  Neurological:     General: No focal deficit present.     Mental Status: She is alert and oriented to person, place, and time.     Cranial Nerves: No cranial nerve deficit (no facial droop, extraocular movements intact, no slurred speech).     Sensory: No sensory deficit.     Motor: No abnormal muscle tone or seizure activity.     Coordination: Coordination normal.     Comments: No pronator drift bilateral upper extrem, able to hold both legs off bed for 5 seconds, sensation intact in all extremities, no visual field cuts, no left or right sided neglect, normal finger-nose exam bilaterally, no nystagmus noted   Psychiatric:        Mood and Affect: Mood normal.    ED Results / Procedures / Treatments   Labs (all labs ordered are listed, but only abnormal results are displayed) Labs Reviewed  CBC - Abnormal; Notable for the following components:      Result Value   Hemoglobin 11.6 (*)    MCV 77.6 (*)    MCH 23.2 (*)    RDW 15.6 (*)    All other components within normal limits  BASIC METABOLIC PANEL  I-STAT BETA HCG BLOOD, ED ([facility], [facility], [facility] ONLY)  TROPONIN I (HIGH SENSITIVITY)  TROPONIN I (HIGH SENSITIVITY)    EKG EKG Interpretation  Date/Time:  Saturday October 16 2020 22:02:57 EDT Ventricular Rate:  79 PR Interval:  170 QRS Duration: 90 QT Interval:  376 QTC Calculation: 431 R Axis:   35 Text  Interpretation: Normal sinus rhythm Minimal voltage criteria for LVH, may be normal variant ( Cornell product ) Borderline ECG Confirmed by [id number] [phone]) on 10/17/2020 9:21:26 AM  Radiology DG Chest 2 View  Result Date: 10/16/2020 CLINICAL  DATA:  Chest pain and headaches for several days, initial encounter EXAM: CHEST - 2 VIEW COMPARISON:  03/27/2018 FINDINGS: Cardiac shadow is within normal limits. The lungs are clear bilaterally. No bony abnormality is seen. IMPRESSION: No active cardiopulmonary disease. Electronically Signed   By: Alcide Clever M.D.   On: 10/16/2020 23:15    Procedures Procedures   Medications Ordered in ED Medications  prochlorperazine (COMPAZINE) injection 10 mg (10 mg Intravenous Given 10/17/20 1007)  ketorolac (TORADOL) 30 MG/ML injection 30 mg (30 mg Intravenous Given 10/17/20 1008)  amLODipine (NORVASC) tablet 5 mg (5 mg Oral Given 10/17/20 1005)    ED Course  I have reviewed the triage vital signs and the nursing notes.  Pertinent labs & imaging results that were available during my care of the patient were reviewed by me and considered in my medical decision making (see chart for details).  Clinical Course as of 10/17/20 1051  Sun Oct 17, 2020  0488 Labs reviewed.  CBC and metabolic panel notable for mild anemia with hemoglobin 11.6.  I doubt this is clinically significant. [JK]  [id number] Serial troponins normal.  Pregnancy test negative. [JK]  0854 X-ray without acute findings [JK]  1051 Symptoms have improved.  Patient is feeling better. [JK]    Clinical Course User Index [JK] Linwood Dibbles, MD   MDM Rules/Calculators/A&P                          Chest pain not suggestive of acute coronary syndrome.  Serial troponins are normal.  EKG reassuring. Low risk for PE.  PERC negative.  Doubt pulmonary embolism Patient's headache is gradual in onset.  No concerning findings to suggest meningitis, subarachnoid hemorrhage, stroke Patient noted to be hypertensive.   No signs of hypertensive urgency or emergency.  No signs of endorgan ischemia or neurologic deficits.  We will start patient on Norvasc.  Her blood pressure did improved to 134/90.  She did tolerate the oral dose of medication Final Clinical Impression(s) / ED Diagnoses Final diagnoses:  Hypertension, unspecified type  Acute nonintractable headache, unspecified headache type    Rx / DC Orders ED Discharge Orders          Ordered    amLODipine (NORVASC) 5 MG tablet  Daily        10/17/20 1049             Linwood Dibbles, MD 10/17/20 1051

## 2020-10-17 NOTE — ED Notes (Signed)
Pt says she experiences vision changes and dizziness with her headache.

## 2020-11-25 ENCOUNTER — Ambulatory Visit (HOSPITAL_COMMUNITY): Payer: BC Managed Care – PPO | Admitting: Psychiatry

## 2021-01-12 ENCOUNTER — Emergency Department (HOSPITAL_BASED_OUTPATIENT_CLINIC_OR_DEPARTMENT_OTHER): Payer: BC Managed Care – PPO | Admitting: Radiology

## 2021-01-12 ENCOUNTER — Other Ambulatory Visit: Payer: Self-pay

## 2021-01-12 ENCOUNTER — Encounter (HOSPITAL_BASED_OUTPATIENT_CLINIC_OR_DEPARTMENT_OTHER): Payer: Self-pay | Admitting: *Deleted

## 2021-01-12 ENCOUNTER — Emergency Department (HOSPITAL_BASED_OUTPATIENT_CLINIC_OR_DEPARTMENT_OTHER)
Admission: EM | Admit: 2021-01-12 | Discharge: 2021-01-12 | Disposition: A | Discharge status: Home / Self Care | Payer: BC Managed Care – PPO | Attending: Emergency Medicine | Admitting: Emergency Medicine

## 2021-01-12 DIAGNOSIS — Z20822 Contact with and (suspected) exposure to covid-19: Secondary | ICD-10-CM | POA: Diagnosis not present

## 2021-01-12 DIAGNOSIS — Z8616 Personal history of COVID-19: Secondary | ICD-10-CM | POA: Insufficient documentation

## 2021-01-12 DIAGNOSIS — R062 Wheezing: Secondary | ICD-10-CM | POA: Diagnosis not present

## 2021-01-12 DIAGNOSIS — U099 Post covid-19 condition, unspecified: Secondary | ICD-10-CM

## 2021-01-12 DIAGNOSIS — R051 Acute cough: Secondary | ICD-10-CM | POA: Insufficient documentation

## 2021-01-12 DIAGNOSIS — H7291 Unspecified perforation of tympanic membrane, right ear: Secondary | ICD-10-CM | POA: Insufficient documentation

## 2021-01-12 DIAGNOSIS — R059 Cough, unspecified: Secondary | ICD-10-CM | POA: Diagnosis not present

## 2021-01-12 LAB — RESP PANEL BY RT-PCR (FLU A&B, COVID) ARPGX2
Influenza A by PCR: NEGATIVE
Influenza B by PCR: NEGATIVE
SARS Coronavirus 2 by RT PCR: NEGATIVE

## 2021-01-12 MED ORDER — PREDNISONE 10 MG PO TABS: 20.0000 mg | ORAL_TABLET | Freq: Every day | ORAL | 0 refills | Status: DC

## 2021-01-12 MED ORDER — PROMETHAZINE-DM 6.25-15 MG/5ML PO SYRP: 5.0000 mL | ORAL_SOLUTION | Freq: Four times a day (QID) | ORAL | 0 refills | Status: DC | PRN

## 2021-01-12 MED ORDER — AMOXICILLIN 500 MG PO CAPS: 500.0000 mg | ORAL_CAPSULE | Freq: Three times a day (TID) | ORAL | 0 refills | Status: DC

## 2021-01-12 MED ORDER — FLUTICASONE PROPIONATE 50 MCG/ACT NA SUSP: 2.0000 | Freq: Every day | NASAL | 2 refills | Status: DC

## 2021-01-12 MED ORDER — ALBUTEROL SULFATE HFA 108 (90 BASE) MCG/ACT IN AERS: 1.0000 | INHALATION_SPRAY | Freq: Four times a day (QID) | RESPIRATORY_TRACT | 0 refills | Status: DC | PRN

## 2021-01-12 NOTE — ED Triage Notes (Signed)
Pt has had a cough for several days, rt ear started to feel full last night, states she has blood tinged secretion with cough. Lungs clear with upper airway wheezing.

## 2021-01-12 NOTE — Discharge Instructions (Signed)
Take the medication as prescribed.  Return for new or worsening symptoms. 

## 2021-01-12 NOTE — ED Provider Notes (Signed)
[facility] EMERGENCY DEPT Provider Note   CSN: [id number] Arrival date & time: 01/12/21  1433     History Chief Complaint  Patient presents with   Cough   Otalgia    Deanna Powell is a 40 y.o. female with past medical history significant for bipolar who presents for evaluation of cough, congestion, rhinorrhea, right ear pain.  Symptoms began yesterday.  States she has been coughing so hard she felt a pop sensation to her right ear and has had muffled hearing since. Has also noted the last time she coughed she had blood-tinged mucus.  No chest pain, shortness of breath, lower extremity swelling, pain, history of PE or DVT.  No fever.  No nausea, vomiting.  No back pain, abdominal pain.  Denies additional aggravating or alleviating factors.  No headache, no neck stiffness or rigidity.  History obtained from patient and past medical records.  No interpreter is used.  HPI     Past Medical History:  Diagnosis Date   Anxiety    Bipolar 1 disorder (HCC)    PTSD (post-traumatic stress disorder)     Patient Active Problem List   Diagnosis Date Noted   COVID-19 03/27/2020   Bipolar disorder (HCC) 06/28/2016   Episodic mood disorder (HCC) 01/06/2015    Past Surgical History:  Procedure Laterality Date   ABDOMINAL HYSTERECTOMY     Fibroids/heavy menses     OB History   No obstetric history on file.     Family History  Problem Relation Age of Onset   Diabetes Father    Cirrhosis Father     Social History   Tobacco Use   Smoking status: Never   Smokeless tobacco: Never  Vaping Use   Vaping Use: Never used  Substance Use Topics   Alcohol use: No    Comment: casual   Drug use: No    Home Medications Prior to Admission medications   Medication Sig Start Date End Date Taking? Authorizing Provider  albuterol (VENTOLIN HFA) 108 (90 Base) MCG/ACT inhaler Inhale 1-2 puffs into the lungs every 6 (six) hours as needed for wheezing or shortness of breath.  01/12/21  Yes Marwah Disbro A, PA-C  amoxicillin (AMOXIL) 500 MG capsule Take 1 capsule (500 mg total) by mouth 3 (three) times daily. 01/12/21  Yes Heloise Gordan A, PA-C  fluticasone (FLONASE) 50 MCG/ACT nasal spray Place 2 sprays into both nostrils daily. 01/12/21  Yes Uno Esau A, PA-C  predniSONE (DELTASONE) 10 MG tablet Take 2 tablets (20 mg total) by mouth daily. 01/12/21  Yes Jakevious Hollister A, PA-C  amLODipine (NORVASC) 5 MG tablet Take 1 tablet (5 mg total) by mouth daily. 10/17/20 11/16/20  Linwood Dibbles, MD  promethazine-dextromethorphan (PROMETHAZINE-DM) 6.25-15 MG/5ML syrup Take 5 mLs by mouth 4 (four) times daily as needed for cough. 01/12/21   Cheney Ewart A, PA-C    Allergies    Patient has no known allergies.  Review of Systems   Review of Systems  Constitutional: Negative.   HENT:  Positive for congestion, ear discharge, postnasal drip and rhinorrhea. Negative for ear pain, facial swelling, sore throat, trouble swallowing and voice change.   Respiratory:  Positive for cough. Negative for apnea, choking, chest tightness, shortness of breath, wheezing and stridor.   Cardiovascular: Negative.   Gastrointestinal: Negative.   Genitourinary: Negative.   Musculoskeletal: Negative.   Skin: Negative.   Neurological: Negative.   All other systems reviewed and are negative.  Physical Exam Updated Vital Signs BP Marland Kitchen)  155/108    Pulse 91    Temp 98.7 F (37.1 C)    Resp 18    Ht 5\' 6"  (1.676 m)    Wt 99.8 kg    SpO2 97%    BMI 35.51 kg/m   Physical Exam Vitals and nursing note reviewed.  Constitutional:      General: She is not in acute distress.    Appearance: She is well-developed. She is not ill-appearing or toxic-appearing.  HENT:     Head: Normocephalic and atraumatic.     Jaw: There is normal jaw occlusion.     Right Ear: No hemotympanum. Tympanic membrane is perforated and erythematous. Tympanic membrane is not scarred or bulging.     Left Ear: Tympanic  membrane, ear canal and external ear normal. There is no impacted cerumen.     Ears:     Comments: Micro perforation to right TM.  Some erythema.    Nose: Congestion and rhinorrhea present.  Eyes:     Pupils: Pupils are equal, round, and reactive to light.  Neck:     Trachea: Trachea and phonation normal.     Comments: No neck stiffness or neck rigidity Cardiovascular:     Rate and Rhythm: Normal rate.     Pulses: Normal pulses.          Radial pulses are 2+ on the right side and 2+ on the left side.     Heart sounds: Normal heart sounds.  Pulmonary:     Effort: No respiratory distress.     Breath sounds: Normal breath sounds and air entry.     Comments: Minimal wheeze, wet cough  Chest:     Comments: Nontender Abdominal:     General: Bowel sounds are normal. There is no distension.     Palpations: Abdomen is soft.     Tenderness: There is no abdominal tenderness.     Comments: Nontender  Musculoskeletal:        General: Normal range of motion.     Cervical back: Full passive range of motion without pain and normal range of motion.     Comments: Compartments soft.  Nontender bilateral lower extremities.  No lower extremity edema, erythema or warmth  Skin:    General: Skin is warm and dry.  Neurological:     General: No focal deficit present.     Mental Status: She is alert.  Psychiatric:        Mood and Affect: Mood normal.   ED Results / Procedures / Treatments   Labs (all labs ordered are listed, but only abnormal results are displayed) Labs Reviewed  RESP PANEL BY RT-PCR (FLU A&B, COVID) [id number]    EKG None  Radiology DG Chest 2 View  Result Date: 01/12/2021 CLINICAL DATA:  Cough. Blood tinged secretion with cough. Upper airway wheezing. EXAM: CHEST - 2 VIEW COMPARISON:  10/16/2020 FINDINGS: The heart size and mediastinal contours are within normal limits. Both lungs are clear. The visualized skeletal structures are unremarkable. IMPRESSION: No active  cardiopulmonary disease. Electronically Signed   By: 12/16/2020 M.D.   On: 01/12/2021 15:20    Procedures Procedures   Medications Ordered in ED Medications - No data to display  ED Course  I have reviewed the triage vital signs and the nursing notes.  Pertinent labs & imaging results that were available during my care of the patient were reviewed by me and considered in my medical decision making (see chart for details).  40 year old  otherwise well presents.  Cough, congestion, rhinorrhea, right ear pain.  Symptoms began yesterday.  She is afebrile, nonseptic, not ill-appearing.  Does have some mild wheeze.  No clinical evidence of VTE on exam.  Did have episode of blood-tinged sputum prior to arrival.  No subsequent episodes.  No large-volume hemoptysis.  Wells criteria low risk.  Suspect bronchitis as cause of her symptoms.  COVID, flu negative.  Chest x-ray negative for infiltrate, cardiomegaly, pulm edema, pneumothorax.  She does have ruptured right eardrum resulting from cough.  Mildly erythematous right TM.  Will start antibiotics.  We will have her follow-up outpatient closely, return for new or worsening symptoms.    MDM Rules/Calculators/A&P                             Final Clinical Impression(s) / ED Diagnoses Final diagnoses:  Acute cough  Perforation of right tympanic membrane    Rx / DC Orders ED Discharge Orders          Ordered    promethazine-dextromethorphan (PROMETHAZINE-DM) 6.25-15 MG/5ML syrup  4 times daily PRN        01/12/21 1954    fluticasone (FLONASE) 50 MCG/ACT nasal spray  Daily        01/12/21 1954    amoxicillin (AMOXIL) 500 MG capsule  3 times daily        01/12/21 1954    predniSONE (DELTASONE) 10 MG tablet  Daily        01/12/21 1954    albuterol (VENTOLIN HFA) 108 (90 Base) MCG/ACT inhaler  Every 6 hours PRN        01/12/21 1954             Sincere Berlanga A, PA-C 01/12/21 2007    Pickering, Nathan, MD 01/13/21 0013

## 2021-01-26 ENCOUNTER — Other Ambulatory Visit (HOSPITAL_BASED_OUTPATIENT_CLINIC_OR_DEPARTMENT_OTHER): Payer: Self-pay

## 2021-01-26 ENCOUNTER — Encounter: Payer: Self-pay | Admitting: Family Medicine

## 2021-01-26 ENCOUNTER — Telehealth: Payer: Self-pay | Admitting: *Deleted

## 2021-01-26 ENCOUNTER — Ambulatory Visit (INDEPENDENT_AMBULATORY_CARE_PROVIDER_SITE_OTHER): Payer: BC Managed Care – PPO | Admitting: Family Medicine

## 2021-01-26 VITALS — BP 140/84 | HR 97 | Temp 98.0°F | Ht 66.0 in | Wt 214.0 lb

## 2021-01-26 DIAGNOSIS — F1010 Alcohol abuse, uncomplicated: Secondary | ICD-10-CM

## 2021-01-26 DIAGNOSIS — F4310 Post-traumatic stress disorder, unspecified: Secondary | ICD-10-CM | POA: Diagnosis not present

## 2021-01-26 DIAGNOSIS — J208 Acute bronchitis due to other specified organisms: Secondary | ICD-10-CM

## 2021-01-26 DIAGNOSIS — F319 Bipolar disorder, unspecified: Secondary | ICD-10-CM | POA: Diagnosis not present

## 2021-01-26 DIAGNOSIS — B9689 Other specified bacterial agents as the cause of diseases classified elsewhere: Secondary | ICD-10-CM

## 2021-01-26 MED ORDER — HYDROXYZINE PAMOATE 25 MG PO CAPS
ORAL_CAPSULE | ORAL | 1 refills | Status: DC
  Filled 2021-01-26: qty 90, fill #0

## 2021-01-26 MED ORDER — ARIPIPRAZOLE 10 MG PO TABS
10.0000 mg | ORAL_TABLET | Freq: Every day | ORAL | 1 refills | Status: DC
  Filled 2021-01-26: qty 30, 30d supply, fill #0

## 2021-01-26 MED ORDER — FLOVENT DISKUS 100 MCG/ACT IN AEPB: INHALATION_SPRAY | RESPIRATORY_TRACT | 0 refills | Status: DC

## 2021-01-26 MED ORDER — ARIPIPRAZOLE 10 MG PO TABS: 10.0000 mg | ORAL_TABLET | Freq: Every day | ORAL | 1 refills | Status: DC

## 2021-01-26 MED ORDER — LAMOTRIGINE 25 MG PO TABS
ORAL_TABLET | ORAL | 1 refills | Status: DC
  Filled 2021-01-26: qty 60, fill #0

## 2021-01-26 MED ORDER — HYDROCODONE BIT-HOMATROP MBR 5-1.5 MG/5ML PO SOLN: 5.0000 mL | Freq: Three times a day (TID) | ORAL | 0 refills | Status: DC | PRN

## 2021-01-26 MED ORDER — LAMOTRIGINE 25 MG PO TABS: ORAL_TABLET | ORAL | 1 refills | Status: DC

## 2021-01-26 MED ORDER — AZITHROMYCIN 250 MG PO TABS: ORAL_TABLET | ORAL | 0 refills | Status: DC

## 2021-01-26 MED ORDER — AMLODIPINE BESYLATE 5 MG PO TABS
5.0000 mg | ORAL_TABLET | Freq: Every day | ORAL | 0 refills | Status: DC
  Filled 2021-01-26 (×2): qty 30, 30d supply, fill #0

## 2021-01-26 MED ORDER — HYDROCODONE BIT-HOMATROP MBR 5-1.5 MG/5ML PO SOLN
5.0000 mL | Freq: Three times a day (TID) | ORAL | 0 refills | Status: DC | PRN
  Filled 2021-01-26: qty 120, 8d supply, fill #0

## 2021-01-26 MED ORDER — FLOVENT DISKUS 100 MCG/ACT IN AEPB
INHALATION_SPRAY | RESPIRATORY_TRACT | 0 refills | Status: DC
  Filled 2021-01-26: qty 60, fill #0

## 2021-01-26 MED ORDER — AMLODIPINE BESYLATE 5 MG PO TABS: 5.0000 mg | ORAL_TABLET | Freq: Every day | ORAL | 0 refills | Status: DC

## 2021-01-26 MED ORDER — AZITHROMYCIN 250 MG PO TABS
ORAL_TABLET | ORAL | 0 refills | Status: DC
  Filled 2021-01-26: qty 6, fill #0

## 2021-01-26 MED ORDER — HYDROXYZINE PAMOATE 25 MG PO CAPS: ORAL_CAPSULE | ORAL | 1 refills | Status: DC

## 2021-01-26 NOTE — Addendum Note (Signed)
Addended by: Radene Gunning on: 01/26/2021 12:39 PM   Modules accepted: Orders

## 2021-01-26 NOTE — Progress Notes (Signed)
Chief Complaint  Patient presents with   Cough    Congestion     Deanna Powell is 41 y.o. and is here for a cough.  Duration: 3 weeks Productive? No Associated symptoms: dyspnea, wheezing Denies: fever, nasal congestion, sore throat, facial pain, hemoptysis Hx of GERD? No ACEi? No No sick contacts. Went to ED and given prednisone, amoxicillin, SABA, INCS, cough syrup.  Tested neg for covid.   Bipolar 1- Hx of Bipolar 1. Was seeing psych, lost job and was lost to f/u. Was on Abilify 15 mg/d, Lamictal 200 mg/d, Vistaril prn. Needs refills. Has not take in 3 mo. Missed psych appt.   Past Medical History:  Diagnosis Date   Anxiety    Bipolar 1 disorder (HCC)    PTSD (post-traumatic stress disorder)    Family History  Problem Relation Age of Onset   Diabetes Father    Cirrhosis Father    Allergies as of 01/26/2021   No Known Allergies      Medication List        Accurate as of January 26, 2021 12:17 PM. If you have any questions, ask your nurse or doctor.          STOP taking these medications    amoxicillin 500 MG capsule Commonly known as: AMOXIL Stopped by: [name], DO   predniSONE 10 MG tablet Commonly known as: DELTASONE Stopped by: [name], DO   promethazine-dextromethorphan 6.25-15 MG/5ML syrup Commonly known as: PROMETHAZINE-DM Stopped by: [name], DO       TAKE these medications    albuterol 108 (90 Base) MCG/ACT inhaler Commonly known as: VENTOLIN HFA Inhale 1-2 puffs into the lungs every 6 (six) hours as needed for wheezing or shortness of breath.   amLODipine 5 MG tablet Commonly known as: NORVASC Take 1 tablet (5 mg total) by mouth daily.   ARIPiprazole 10 MG tablet Commonly known as: ABILIFY Take 1 tablet (10 mg total) by mouth daily. Started by: [name], DO   azithromycin 250 MG tablet Commonly known as: ZITHROMAX Take 2 tabs by mouth the first day and then 1 tab  daily until you run out. Started by: [name], DO   Flovent Diskus 100 MCG/ACT Aepb Generic drug: Fluticasone Propionate (Inhal) Inhale 2 puffs by mouth twice daily. Rinse out your mouth after the 2 puffs. Started by: [name], DO   fluticasone 50 MCG/ACT nasal spray Commonly known as: FLONASE Place 2 sprays into both nostrils daily.   HYDROcodone bit-homatropine 5-1.5 MG/5ML syrup Commonly known as: HYCODAN Take 5 mLs by mouth every 8 (eight) hours as needed for cough. Started by: [name], DO   hydrOXYzine 25 MG capsule Commonly known as: Vistaril Take one capsule in am and two at bed time Started by: [name], DO   lamoTRIgine 25 MG tablet Commonly known as: LaMICtal Take one tab daily for one week and than 2 tab daily Started by: Jilda Roche Olivier Frayre, DO        BP 140/84    Pulse 97    Temp 98 F (36.7 C) (Oral)    Ht 5\' 6"  (1.676 m)    Wt 214 lb (97.1 kg)    SpO2 98%    BMI 34.54 kg/m  Gen: Awake, alert, appears stated age HEENT: Ears neg, nares patent without D/C, turbinates unremarkable, Pharynx pink without exudate Neck: Supple, no masses or asymmetry, no tenderness Heart: RRR, no LE edema Lungs:  CTAB, normal effort, no accessory muscle use Psych: Age appropriate judgement and insight, normal mood and affect  Acute bacterial bronchitis  Bipolar I disorder (HCC), Chronic - Plan: ARIPiprazole (ABILIFY) 10 MG tablet, lamoTRIgine (LAMICTAL) 25 MG tablet  Bipolar I disorder (HCC) - Plan: ARIPiprazole (ABILIFY) 10 MG tablet, lamoTRIgine (LAMICTAL) 25 MG tablet  PTSD (post-traumatic stress disorder) - Plan: ARIPiprazole (ABILIFY) 10 MG tablet, hydrOXYzine (VISTARIL) 25 MG capsule, lamoTRIgine (LAMICTAL) 25 MG tablet  ETOH abuse - Plan: hydrOXYzine (VISTARIL) 25 MG capsule  Likely post infectious cough syndrome. ICS, rinse mouth out after use. Syrup prn. Scheduled SABA use q 4 hrs. Restart Abilify at 10  mg/d, Lamictal 25 mg/d for 1 week and then increase to 50 mg/d. Needs to re-est w psych.  F/u in 1 mo for CPE or prn. The patient voiced understanding and agreement to the plan.  Jilda Roche [place], DO 01/26/21 12:17 PM

## 2021-01-26 NOTE — Patient Instructions (Signed)
Continue to push fluids, practice good hand hygiene, and cover your mouth if you cough.  If you start having fevers, shaking or shortness of breath, seek immediate care.  Honey can help with your cough.  Let us know if you need anything.

## 2021-01-26 NOTE — Telephone Encounter (Signed)
error 

## 2021-01-26 NOTE — Addendum Note (Signed)
Addended by: Kem Boroughs D on: 01/26/2021 12:29 PM   Modules accepted: Orders

## 2021-02-01 ENCOUNTER — Encounter: Payer: BC Managed Care – PPO | Admitting: Family Medicine

## 2021-02-02 ENCOUNTER — Encounter: Payer: Self-pay | Admitting: Family Medicine

## 2021-02-02 ENCOUNTER — Ambulatory Visit (INDEPENDENT_AMBULATORY_CARE_PROVIDER_SITE_OTHER): Payer: BC Managed Care – PPO | Admitting: Family Medicine

## 2021-02-02 ENCOUNTER — Other Ambulatory Visit (HOSPITAL_COMMUNITY)
Admission: RE | Admit: 2021-02-02 | Discharge: 2021-02-02 | Disposition: A | Discharge status: Home / Self Care | Payer: BC Managed Care – PPO | Source: Ambulatory Visit | Attending: Family Medicine | Admitting: Family Medicine

## 2021-02-02 VITALS — BP 153/99 | HR 83 | Temp 98.2°F | Resp 18 | Ht 66.0 in | Wt 217.8 lb

## 2021-02-02 DIAGNOSIS — Z1159 Encounter for screening for other viral diseases: Secondary | ICD-10-CM | POA: Diagnosis not present

## 2021-02-02 DIAGNOSIS — Z113 Encounter for screening for infections with a predominantly sexual mode of transmission: Secondary | ICD-10-CM

## 2021-02-02 DIAGNOSIS — Z23 Encounter for immunization: Secondary | ICD-10-CM

## 2021-02-02 DIAGNOSIS — Z0000 Encounter for general adult medical examination without abnormal findings: Secondary | ICD-10-CM

## 2021-02-02 DIAGNOSIS — Z1231 Encounter for screening mammogram for malignant neoplasm of breast: Secondary | ICD-10-CM | POA: Diagnosis not present

## 2021-02-02 DIAGNOSIS — R0683 Snoring: Secondary | ICD-10-CM | POA: Diagnosis not present

## 2021-02-02 DIAGNOSIS — Z114 Encounter for screening for human immunodeficiency virus [HIV]: Secondary | ICD-10-CM | POA: Diagnosis not present

## 2021-02-02 LAB — LIPID PANEL
Cholesterol: 163 mg/dL (ref 0–200)
HDL: 38.4 mg/dL — ABNORMAL LOW (ref >39.00)
LDL Cholesterol: 109 mg/dL — ABNORMAL HIGH (ref 0–99)
NonHDL: 125.07
Total CHOL/HDL Ratio: 4
Triglycerides: 78 mg/dL (ref 0.0–149.0)
VLDL: 15.6 mg/dL (ref 0.0–40.0)

## 2021-02-02 LAB — CBC
HCT: 36.1 % (ref 36.0–46.0)
Hemoglobin: 11.3 g/dL — ABNORMAL LOW (ref 12.0–15.0)
MCHC: 31.2 g/dL (ref 30.0–36.0)
MCV: 72.6 fl — ABNORMAL LOW (ref 78.0–100.0)
Platelets: 264 10*3/uL (ref 150.0–400.0)
RBC: 4.97 Mil/uL (ref 3.87–5.11)
RDW: 17.1 % — ABNORMAL HIGH (ref 11.5–15.5)
WBC: 5.6 10*3/uL (ref 4.0–10.5)

## 2021-02-02 LAB — COMPREHENSIVE METABOLIC PANEL
ALT: 14 U/L (ref 0–35)
AST: 15 U/L (ref 0–37)
Albumin: 4.1 g/dL (ref 3.5–5.2)
Alkaline Phosphatase: 56 U/L (ref 39–117)
BUN: 8 mg/dL (ref 6–23)
CO2: 28 mEq/L (ref 19–32)
Calcium: 8.7 mg/dL (ref 8.4–10.5)
Chloride: 105 mEq/L (ref 96–112)
Creatinine, Ser: 0.83 mg/dL (ref 0.40–1.20)
GFR: 86.83 mL/min (ref >60.00)
Glucose, Bld: 97 mg/dL (ref 70–99)
Potassium: 3.6 mEq/L (ref 3.5–5.1)
Sodium: 141 mEq/L (ref 135–145)
Total Bilirubin: 0.3 mg/dL (ref 0.2–1.2)
Total Protein: 6.9 g/dL (ref 6.0–8.3)

## 2021-02-02 LAB — HIV ANTIBODY (ROUTINE TESTING W REFLEX): HIV 1&2 Ab, 4th Generation: NONREACTIVE

## 2021-02-02 LAB — HEPATITIS C ANTIBODY
Hepatitis C Ab: NONREACTIVE
SIGNAL TO CUT-OFF: 0.02 (ref <1.00)

## 2021-02-02 MED ORDER — LISINOPRIL 20 MG PO TABS: 20.0000 mg | ORAL_TABLET | Freq: Every day | ORAL | 3 refills | Status: DC

## 2021-02-02 NOTE — Progress Notes (Signed)
Chief Complaint  Patient presents with   Annual Exam     Well Woman Deanna Powell is here for a complete physical.   Her last physical was >1 year ago.  Current diet: in general, a "healthy" diet. Current exercise: none. Weight is stable and she confirms fatigue out of ordinary. Seatbelt? Yes Advanced directive? No  Health Maintenance Pap/HPV- Due Mammogram- No Tetanus- Due Hep C screening- No HIV screening- Yes  Hypertension Patient presents for hypertension follow up. She does not monitor home blood pressures. She is compliant with medication- Norvasc 5 mg/d. Patient has these side effects of medication: none Diet/exercise as above.   +fatigue, snoring, gasping for air. Does not feel well rested. Has gained weight and this got worse.  She has never had a sleep study.  Left ankle pain on the outside over the past 2 months.  No injury or change in activity.  She denies any bruising, redness, swelling, or decreased range of motion.  She has not tried anything at home so far.  Past Medical History:  Diagnosis Date   Anxiety    Bipolar 1 disorder (HCC)    PTSD (post-traumatic stress disorder)      Past Surgical History:  Procedure Laterality Date   ABDOMINAL HYSTERECTOMY     Fibroids/heavy menses    Medications  Current Outpatient Medications on File Prior to Visit  Medication Sig Dispense Refill   albuterol (VENTOLIN HFA) 108 (90 Base) MCG/ACT inhaler Inhale 1-2 puffs into the lungs every 6 (six) hours as needed for wheezing or shortness of breath. 8 g 0   amLODipine (NORVASC) 5 MG tablet Take 1 tablet (5 mg total) by mouth daily. 30 tablet 0   ARIPiprazole (ABILIFY) 10 MG tablet Take 1 tablet (10 mg total) by mouth daily. 30 tablet 1   fluticasone (FLONASE) 50 MCG/ACT nasal spray Place 2 sprays into both nostrils daily. 11.1 mL 2   Fluticasone Propionate, Inhal, (FLOVENT DISKUS) 100 MCG/ACT AEPB Inhale 2 puffs by mouth twice daily. Rinse out your mouth after the 2  puffs. 60 each 0   HYDROcodone bit-homatropine (HYCODAN) 5-1.5 MG/5ML syrup Take 5 mLs by mouth every 8 (eight) hours as needed for cough. 120 mL 0   hydrOXYzine (VISTARIL) 25 MG capsule Take one capsule by mouth in the morning and two at bed time 90 capsule 1   lamoTRIgine (LAMICTAL) 25 MG tablet Take one tablet by mouth daily for one week and then 2 tab daily 60 tablet 1   Allergies No Known Allergies  Review of Systems: Constitutional:  no unexpected weight changes Eye:  no recent significant change in vision Ear/Nose/Mouth/Throat:  Ears:  no recent change in hearing Nose/Mouth/Throat:  no complaints of nasal congestion, no sore throat Cardiovascular: no chest pain Respiratory:  no shortness of breath Gastrointestinal:  +constipation GU:  Female: negative for dysuria or pelvic pain Musculoskeletal/Extremities: +L ankle pain Integumentary (Skin/Breast):  no abnormal skin lesions reported Neurologic:  no headaches Endocrine:  denies fatigue Hematologic/Lymphatic:  No areas of easy bleeding  Exam BP (!) 153/99    Pulse 83    Temp 98.2 F (36.8 C)    Resp 18    Ht 5\' 6"  (1.676 m)    Wt 217 lb 12.8 oz (98.8 kg)    SpO2 94%    BMI 35.15 kg/m  General:  well developed, well nourished, in no apparent distress Skin:  no significant moles, warts, or growths Head:  no masses, lesions, or tenderness Eyes:  pupils equal and round, sclera anicteric without injection Ears:  canals without lesions, TMs shiny without retraction, no obvious effusion, no erythema Nose:  nares patent, septum midline, mucosa normal, and no drainage or sinus tenderness Throat/Pharynx:  lips and gingiva without lesion; tongue and uvula midline; non-inflamed pharynx; no exudates or postnasal drainage Neck: neck supple without adenopathy, thyromegaly, or masses Lungs:  clear to auscultation, breath sounds equal bilaterally, no respiratory distress Cardio:  regular rate and rhythm, no LE edema Abdomen:  abdomen soft,  nontender, moderately distended diffusely; bowel sounds normal; no masses or organomegaly Genital: Defer to GYN Musculoskeletal: + TTP over the lateral left ankle distal to the lateral malleolus.  There is no bony tenderness.  Negative squeeze and anterior drawer of the left ankle. Extremities:  no clubbing, cyanosis, or edema, no deformities, no skin discoloration Neuro:  gait normal; deep tendon reflexes normal and symmetric Psych: well oriented with normal range of affect and appropriate judgment/insight  Assessment and Plan  Well adult exam - Plan: CBC, Comprehensive metabolic panel, Lipid panel  Encounter for screening mammogram for malignant neoplasm of breast - Plan: MM DIGITAL SCREENING BILATERAL  Encounter for hepatitis C screening test for low risk patient - Plan: Hepatitis C antibody  Screening for HIV without presence of risk factors - Plan: HIV Antibody (routine testing w rflx)  Routine screening for STI (sexually transmitted infection) - Plan: Urine cytology ancillary only([place])  Snoring - Plan: Ambulatory referral to Pulmonology  Need for Tdap vaccination - Plan: Tdap vaccine greater than or equal to 7yo IM   Well 41 y.o. female. Counseled on diet and exercise. Other orders as above. Advanced directive form provided today.  COVID vaccination recommended.  She politely declined the flu shot.  Tdap today. Snoring: Refer to pulmonology/sleep team. HTN: Chronic, uncontrolled.  Continue Norvasc 5 mg daily, add lisinopril 20 mg daily.  She does not have a uterus. GYN information provided.  She may not need Pap smears any longer. Ankle pain: Stretches and exercises provided.  Physical therapy if no improvement. Follow up in 1 mo to reck BP. The patient voiced understanding and agreement to the plan.  Jilda Roche Dunn, DO 02/02/21 11:55 AM

## 2021-02-02 NOTE — Patient Instructions (Addendum)
Give [place] 2-3 business days to get the results of your labs back.   Keep the diet clean and stay active.  Try MiraLAX 1-2 times daily over the next 3-4 days. If no improvement, try using an enema. Stay well hydrated and keep lots of fiber in your diet.  Aim to do some physical exertion for 150 minutes per week. This is typically divided into 5 days per week, 30 minutes per day. The activity should be enough to get your heart rate up. Anything is better than nothing if you have time constraints.  Check your blood pressures 2-3 times per week, alternating the time of day you check it. If it is high, considering waiting 1-2 minutes and rechecking. If it gets higher, your anxiety is likely creeping up and we should avoid rechecking.   If you do not hear anything about your referral in the next 1-2 weeks, call our office and ask for an update.  Call Center for [facility] at [facility] at [phone] for an appointment.  They are located at [address], [place], [place], [address] (right across the hall from our office).  I recommend getting the updated covid vaccination and booster at your convenience. Go to the pharmacy for this.   Let us know if you need anything.  Ankle Exercises It is normal to feel mild stretching, pulling, tightness, or discomfort as you do these exercises, but you should stop right away if you feel sudden pain or your pain gets worse.  Stretching and range of motion exercises These exercises warm up your muscles and joints and improve the movement and flexibility of your ankle. These exercises also help to relieve pain, numbness, and tingling. Exercise A: Dorsiflexion/Plantar Flexion    Sit with your affected knee straight or bent. Do not rest your foot on anything. Flex your affected ankle to tilt the top of your foot toward your shin. Hold this position for 5 seconds. Point your toes downward to tilt the top of your foot away from your  shin. Hold this position for 5 seconds. Repeat 2 times. Complete this exercise 3 times per week. Exercise B: Ankle Alphabet    Sit with your affected foot supported at your lower leg. Do not rest your foot on anything. Make sure your foot has room to move freely. Think of your affected foot as a paintbrush, and move your foot to trace each letter of the alphabet in the air. Keep your hip and knee still while you trace. Make the letters as large as you can without increasing any discomfort. Trace every letter from A to Z. Repeat 2 times. Complete this exercise 3 times per week. Strengthening exercises These exercises build strength and endurance in your ankle. Endurance is the ability to use your muscles for a long time, even after they get tired. Exercise D: Dorsiflexors    Secure a rubber exercise band or tube to an object, such as a table leg, that will stay still when the band is pulled. Secure the other end around your affected foot. Sit on the floor, facing the object with your affected leg extended. The band or tube should be slightly tense when your foot is relaxed. Slowly flex your affected ankle and toes to bring your foot toward you. Hold this position for 3 seconds.  Slowly return your foot to the starting position, controlling the band as you do that. Do a total of 10 repetitions. Repeat 2 times. Complete  this exercise 3 times per week. Exercise E: Plantar Flexors    Sit on the floor with your affected leg extended. Loop a rubber exercise band or tube around the ball of your affected foot. The ball of your foot is on the walking surface, right under your toes. The band or tube should be slightly tense when your foot is relaxed. Slowly point your toes downward, pushing them away from you. Hold this position for 3 seconds. Slowly release the tension in the band or tube, controlling smoothly until your foot is back in the starting position. Repeat for a total of 10  repetitions. Repeat 2 times. Complete this exercise 3 times per week. Exercise F: Towel Curls    Sit in a chair on a non-carpeted surface, and put your feet on the floor. Place a towel in front of your feet.  Keeping your heel on the floor, put your affected foot on the towel. Pull the towel toward you by grabbing the towel with your toes and curling them under. Keep your heel on the floor. Let your toes relax. Grab the towel again. Keep going until the towel is completely underneath your foot. Repeat for a total of 10 repetitions. Repeat 2 times. Complete this exercise 3 times per week. Exercise G: Heel Raise ( Plantar Flexors, Standing)     Stand with your feet shoulder-width apart. Keep your weight spread evenly over the width of your feet while you rise up on your toes. Use a wall or table to steady yourself, but try not to use it for support. If this exercise is too easy, try these options: Shift your weight toward your affected leg until you feel challenged. If told by your health care provider, lift your uninjured leg off the floor. Hold this position for 3 seconds. Repeat for a total of 10 repetitions. Repeat 2 times. Complete this exercise 3 times per week. Exercise H: Tandem Walking Stand with one foot directly in front of the other. Slowly raise your back foot up, lifting your heel before your toes, and place it directly in front of your other foot. Continue to walk in this heel-to-toe way for 10 steps or for as long as told by your health care provider. Have a countertop or wall nearby to use if needed to keep your balance, but try not to hold onto anything for support. Repeat 2 times. Complete this exercises 3 times per week. Make sure you discuss any questions you have with your health care provider. Document Released: 11/16/2004 Document Revised: 09/02/2015 Document Reviewed: 09/20/2014 Elsevier Interactive Patient Education  2018 [employer].

## 2021-02-03 LAB — URINE CYTOLOGY ANCILLARY ONLY
Chlamydia: NEGATIVE
Comment: NEGATIVE
Comment: NEGATIVE
Comment: NORMAL
Neisseria Gonorrhea: NEGATIVE
Trichomonas: NEGATIVE

## 2021-02-11 ENCOUNTER — Telehealth (HOSPITAL_COMMUNITY): Payer: Self-pay | Admitting: *Deleted

## 2021-02-11 NOTE — Telephone Encounter (Signed)
Writer reached out to pt as she has not seen Dr.Arfeen since 10/02/19 to see if pt wants an appointment or to be closed out. Pt says that she would like to see provider again. Front desk notified to call pt to schedule.

## 2021-02-14 ENCOUNTER — Other Ambulatory Visit: Payer: Self-pay

## 2021-02-14 ENCOUNTER — Encounter (HOSPITAL_BASED_OUTPATIENT_CLINIC_OR_DEPARTMENT_OTHER): Payer: Self-pay

## 2021-02-14 ENCOUNTER — Ambulatory Visit (HOSPITAL_BASED_OUTPATIENT_CLINIC_OR_DEPARTMENT_OTHER)
Admission: RE | Admit: 2021-02-14 | Discharge: 2021-02-14 | Disposition: A | Discharge status: Home / Self Care | Payer: BC Managed Care – PPO | Source: Ambulatory Visit | Attending: Family Medicine | Admitting: Family Medicine

## 2021-02-14 ENCOUNTER — Other Ambulatory Visit: Payer: Self-pay | Admitting: Family Medicine

## 2021-02-14 DIAGNOSIS — N6320 Unspecified lump in the left breast, unspecified quadrant: Secondary | ICD-10-CM

## 2021-02-14 DIAGNOSIS — Z1231 Encounter for screening mammogram for malignant neoplasm of breast: Secondary | ICD-10-CM | POA: Insufficient documentation

## 2021-02-20 ENCOUNTER — Other Ambulatory Visit: Payer: Self-pay

## 2021-02-20 ENCOUNTER — Encounter (HOSPITAL_BASED_OUTPATIENT_CLINIC_OR_DEPARTMENT_OTHER): Payer: Self-pay | Admitting: Emergency Medicine

## 2021-02-20 ENCOUNTER — Emergency Department (HOSPITAL_BASED_OUTPATIENT_CLINIC_OR_DEPARTMENT_OTHER)
Admission: EM | Admit: 2021-02-20 | Discharge: 2021-02-20 | Disposition: A | Discharge status: Home / Self Care | Payer: BC Managed Care – PPO | Attending: Emergency Medicine | Admitting: Emergency Medicine

## 2021-02-20 DIAGNOSIS — R040 Epistaxis: Secondary | ICD-10-CM | POA: Diagnosis not present

## 2021-02-20 DIAGNOSIS — R519 Headache, unspecified: Secondary | ICD-10-CM | POA: Insufficient documentation

## 2021-02-20 DIAGNOSIS — R030 Elevated blood-pressure reading, without diagnosis of hypertension: Secondary | ICD-10-CM | POA: Insufficient documentation

## 2021-02-20 DIAGNOSIS — I10 Essential (primary) hypertension: Secondary | ICD-10-CM | POA: Insufficient documentation

## 2021-02-20 LAB — CBC WITH DIFFERENTIAL/PLATELET
Abs Immature Granulocytes: 0.01 10*3/uL (ref 0.00–0.07)
Basophils Absolute: 0 10*3/uL (ref 0.0–0.1)
Basophils Relative: 1 %
Eosinophils Absolute: 0.1 10*3/uL (ref 0.0–0.5)
Eosinophils Relative: 2 %
HCT: 38.3 % (ref 36.0–46.0)
Hemoglobin: 11.4 g/dL — ABNORMAL LOW (ref 12.0–15.0)
Immature Granulocytes: 0 %
Lymphocytes Relative: 28 %
Lymphs Abs: 1.7 10*3/uL (ref 0.7–4.0)
MCH: 22.3 pg — ABNORMAL LOW (ref 26.0–34.0)
MCHC: 29.8 g/dL — ABNORMAL LOW (ref 30.0–36.0)
MCV: 75 fL — ABNORMAL LOW (ref 80.0–100.0)
Monocytes Absolute: 0.4 10*3/uL (ref 0.1–1.0)
Monocytes Relative: 7 %
Neutro Abs: 3.7 10*3/uL (ref 1.7–7.7)
Neutrophils Relative %: 62 %
Platelets: 267 10*3/uL (ref 150–400)
RBC: 5.11 MIL/uL (ref 3.87–5.11)
RDW: 16.2 % — ABNORMAL HIGH (ref 11.5–15.5)
WBC: 6 10*3/uL (ref 4.0–10.5)
nRBC: 0 % (ref 0.0–0.2)

## 2021-02-20 LAB — BASIC METABOLIC PANEL
Anion gap: 8 (ref 5–15)
BUN: 10 mg/dL (ref 6–20)
CO2: 28 mmol/L (ref 22–32)
Calcium: 9 mg/dL (ref 8.9–10.3)
Chloride: 102 mmol/L (ref 98–111)
Creatinine, Ser: 0.76 mg/dL (ref 0.44–1.00)
GFR, Estimated: >60 mL/min (ref >60)
Glucose, Bld: 94 mg/dL (ref 70–99)
Potassium: 3.9 mmol/L (ref 3.5–5.1)
Sodium: 138 mmol/L (ref 135–145)

## 2021-02-20 MED ORDER — LIDOCAINE-EPINEPHRINE (PF) 2 %-1:200000 IJ SOLN
10.0000 mL | Freq: Once | INTRAMUSCULAR | Status: AC
  Administered 2021-02-20: 10 mL
  Filled 2021-02-20: qty 20

## 2021-02-20 MED ORDER — SILVER NITRATE-POT NITRATE 75-25 % EX MISC
1.0000 "application " | Freq: Once | CUTANEOUS | Status: AC
  Administered 2021-02-20: 1 via TOPICAL
  Filled 2021-02-20: qty 10

## 2021-02-20 MED ORDER — LISINOPRIL 10 MG PO TABS
10.0000 mg | ORAL_TABLET | Freq: Once | ORAL | Status: AC
Start: 2021-02-20 — End: 2021-02-20
  Administered 2021-02-20: 10 mg via ORAL
  Filled 2021-02-20: qty 1

## 2021-02-20 MED ORDER — AMLODIPINE BESYLATE 5 MG PO TABS
5.0000 mg | ORAL_TABLET | Freq: Once | ORAL | Status: AC
  Administered 2021-02-20: 5 mg via ORAL
  Filled 2021-02-20: qty 1

## 2021-02-20 MED ORDER — LIDOCAINE-EPINEPHRINE 2 %-1:100000 IJ SOLN: 20.0000 mL | Freq: Once | INTRAMUSCULAR | Status: DC

## 2021-02-20 MED ORDER — ACETAMINOPHEN 500 MG PO TABS
1000.0000 mg | ORAL_TABLET | Freq: Once | ORAL | Status: AC
  Administered 2021-02-20: 1000 mg via ORAL
  Filled 2021-02-20: qty 2

## 2021-02-20 NOTE — Discharge Instructions (Signed)
As we discussed, there are several techniques you can use to prevent or stop nosebleeds in the future.  Keep your nose moist either with saline spray several times a day or by applying a thin layer of Neosporin, bacitracin, or other antibiotic ointment to the inside of your nose once or twice a day.  If the bleeding starts up again, gently blow your nose into a tissue to clear the blood and clots. Then squeeze your nose shut tightly and DO NOT PEEK for at least 15 minutes.  This will resolve most nosebleeds.  If you continue to have trouble after trying these techniques, or anything seems out of the ordinary or concerns you, please return tot he Emergency Department.  You can also download the Good Rx App on your phone which can help with prescription discounts so that you can get back on your blood pressure medication.

## 2021-02-20 NOTE — ED Triage Notes (Signed)
Headache and Nosebleed starting in the am, has been off and on, has been able to control the bleeding.

## 2021-02-20 NOTE — ED Provider Notes (Signed)
Emergency Department Provider Note   I have reviewed the triage vital signs and the nursing notes.   HISTORY  Chief Complaint Headache and Epistaxis   HPI Deanna Powell is a 41 y.o. female with PMH reviewed below including HTN with poor med compliance, presents to the ED with intermittent epistaxis and HA.  She states she has been off of her blood pressure medications for at least the past 2 weeks because she has been unable to afford them.  She developed throbbing frontal headache this morning.  No sudden onset/maximal intensity headache.  No fevers or chills.  She is had intermittent epistaxis from the right nostril which has been heavy at times but she is able to stop it by holding pressure.  She is not anticoagulated.  No injury to the nose.    Past Medical History:  Diagnosis Date   Anxiety    Bipolar 1 disorder ([place])    PTSD (post-traumatic stress disorder)     Review of Systems  Constitutional: No fever/chills Eyes: No visual changes. ENT: No sore throat. Positive right nostril epistaxis.  Cardiovascular: Denies chest pain. Respiratory: Denies shortness of breath. Gastrointestinal: No abdominal pain.  No nausea, no vomiting.  No diarrhea.  No constipation. Genitourinary: Negative for dysuria. Musculoskeletal: Negative for back pain. Skin: Negative for rash. Neurological: Negative for focal weakness or numbness. Positive frontal HA.    ____________________________________________   PHYSICAL EXAM:  VITAL SIGNS: ED Triage Vitals  Enc Vitals Group     BP 02/20/21 1748 (!) 181/109     Pulse Rate 02/20/21 1748 93     Resp 02/20/21 1748 18     Temp 02/20/21 1748 98.6 F (37 C)     Temp Source 02/20/21 1748 Oral     SpO2 02/20/21 1748 98 %     Weight 02/20/21 1749 217 lb (98.4 kg)     Height 02/20/21 1749 5\' 6"  (1.676 m)   Constitutional: Alert and oriented. Well appearing and in no acute distress. Eyes: Conjunctivae are normal.  Head: Atraumatic. Nose: No  congestion/rhinnorhea.  No active epistaxis.  There is an area of erythema and mild eschar to the anterior nasal septum.  Mouth/Throat: Mucous membranes are moist.  Neck: No stridor.  Cardiovascular: Good peripheral circulation.  Respiratory: Normal respiratory effort.   Gastrointestinal: No distention.  Musculoskeletal: No gross deformities of extremities. Neurologic:  Normal speech and language.  Skin:  Skin is warm, dry and intact. No rash noted.  ____________________________________________   LABS (all labs ordered are listed, but only abnormal results are displayed)  Labs Reviewed  CBC WITH DIFFERENTIAL/PLATELET - Abnormal; Notable for the following components:      Result Value   Hemoglobin 11.4 (*)    MCV 75.0 (*)    MCH 22.3 (*)    MCHC 29.8 (*)    RDW 16.2 (*)    All other components within normal limits  BASIC METABOLIC PANEL    ____________________________________________   PROCEDURES  Procedure(s) performed:   .Epistaxis Management  Date/Time: 02/20/2021 7:20 PM Performed by: Margette Fast, MD Authorized by: Margette Fast, MD   Consent:    Consent obtained:  Verbal   Consent given by:  Patient   Risks, benefits, and alternatives were discussed: yes     Risks discussed:  Bleeding, infection, nasal injury and pain   Alternatives discussed:  No treatment Universal protocol:    Patient identity confirmed:  Verbally with patient Anesthesia:    Anesthesia method:  Topical  application   Topical anesthetic:  Lidocaine gel and epinephrine Procedure details:    Treatment site:  R anterior   Treatment method:  Silver nitrate   Treatment complexity:  Limited   Treatment episode: initial   Post-procedure details:    Assessment:  Bleeding stopped   Procedure completion:  Tolerated well, no immediate complications   ____________________________________________   INITIAL IMPRESSION / ASSESSMENT AND PLAN / ED COURSE  Pertinent labs & imaging results that  were available during my care of the patient were reviewed by me and considered in my medical decision making (see chart for details).   This patient is Presenting for Evaluation of epistaxis, which does require a range of treatment options, and is a complaint that involves a moderate risk of morbidity and mortality.  The Differential Diagnoses include traumatic bleeding, irritation of nasal mucosa, HTN, tension HA, coagulopathy.   Critical Interventions- Epistaxis mgmt   Medications  silver nitrate applicators applicator 1 application (1 application Topical Given 02/20/21 1845)  amLODipine (NORVASC) tablet 5 mg (5 mg Oral Given 02/20/21 1836)  lisinopril (ZESTRIL) tablet 10 mg (10 mg Oral Given 02/20/21 1836)  acetaminophen (TYLENOL) tablet 1,000 mg (1,000 mg Oral Given 02/20/21 1836)  lidocaine-EPINEPHrine (XYLOCAINE W/EPI) 2 %-1:200000 (PF) injection 10 mL (10 mLs Other Given 02/20/21 1845)    Reassessment after intervention:  Bleeding has stopped on reassessment.   I decided to review pertinent External Data, and in summary no recent ED visits.   Clinical Laboratory Tests Ordered, included No leukocytosis. Mild anemia. No AKI or electrolyte disturbance.  Radiologic Tests: Considered CT head given HA but patient is neuro intact here without sudden/severe HA to suspect SAH or other acute intracranial process. Will defer imaging for now.   Social Determinants of Health Risk patient is a non-smoker.    Medical Decision Making: Summary:  Presents emergency department for evaluation of headache with some epistaxis.  She has significantly elevated blood pressure here.  Will get screening blood work to assess for anemia, kidney injury, electrolyte disturbance.  She is been off of her blood pressure medications for at least 2 weeks due to difficulty affording her medicines.  Plan to give doses of her medicines here and will give coupon for good Rx to assist.   She is not actively having epistaxis but  I do see an area in the right nostril which looks to have been recently bleeding.  Plan for lidocaine with epinephrine and followed by silver nitrate application to prevent continued/recurrent bleeding.   Reevaluation with update and discussion with patient. Labs are reassuring. Discussed the Good Rx app for Rx discounts to get back on home medications. Patient is not out of refills. Epistaxis resolved after mgmt as above. Discussed nasal saline spray to prevent future bleeding.   Disposition: discharge   ____________________________________________  FINAL CLINICAL IMPRESSION(S) / ED DIAGNOSES  Final diagnoses:  Epistaxis  Elevated blood pressure reading    Note:  This document was prepared using Dragon voice recognition software and may include unintentional dictation errors.  Nanda Quinton, MD, [facility]    Kaiden Dardis, [facility], MD 02/20/21 [phone]

## 2021-03-04 ENCOUNTER — Ambulatory Visit: Payer: BC Managed Care – PPO | Admitting: Family Medicine

## 2021-03-04 ENCOUNTER — Encounter: Payer: Self-pay | Admitting: Family Medicine

## 2021-03-07 ENCOUNTER — Encounter: Payer: Self-pay | Admitting: Family Medicine

## 2021-03-07 ENCOUNTER — Ambulatory Visit (INDEPENDENT_AMBULATORY_CARE_PROVIDER_SITE_OTHER): Payer: BC Managed Care – PPO | Admitting: Family Medicine

## 2021-03-07 VITALS — BP 145/84 | HR 75 | Temp 98.1°F | Resp 16 | Ht 66.0 in | Wt 211.8 lb

## 2021-03-07 DIAGNOSIS — R0683 Snoring: Secondary | ICD-10-CM | POA: Diagnosis not present

## 2021-03-07 DIAGNOSIS — I10 Essential (primary) hypertension: Secondary | ICD-10-CM | POA: Diagnosis not present

## 2021-03-07 MED ORDER — AMLODIPINE BESYLATE-VALSARTAN 10-320 MG PO TABS: 1.0000 | ORAL_TABLET | Freq: Every day | ORAL | 2 refills | Status: DC

## 2021-03-07 NOTE — Patient Instructions (Signed)
Keep the diet clean and stay active. ° °Check your blood pressures 2-3 times per week, alternating the time of day you check it. If it is high, considering waiting 1-2 minutes and rechecking. If it gets higher, your anxiety is likely creeping up and we should avoid rechecking.  ° °If you do not hear anything about your referral in the next 1-2 weeks, call our office and ask for an update. ° °Let us know if you need anything. °

## 2021-03-07 NOTE — Progress Notes (Signed)
Chief Complaint  Patient presents with   Follow-up    Follow up   Hypertension    Subjective Deanna Powell is a 41 y.o. female who presents for hypertension follow up. She does not monitor home blood pressures. She is compliant with medications- Norvasc 5 mg/d, lisinopril 20 mg/d was recently added. Was unable to afford recently and was without it causing her to go to the ER. Patient has these side effects of medication: none She is sometimes adhering to a healthy diet overall. Current exercise: hip hop ab workouts No Cp or SOB.    Past Medical History:  Diagnosis Date   Anxiety    Bipolar 1 disorder (HCC)    PTSD (post-traumatic stress disorder)     Exam BP (!) 145/84 (BP Location: Right Arm, Patient Position: Sitting, Cuff Size: Normal)    Pulse 75    Temp 98.1 F (36.7 C) (Oral)    Resp 16    Ht 5\' 6"  (1.676 m)    Wt 211 lb 12.8 oz (96.1 kg)    SpO2 99%    BMI 34.19 kg/m  General:  well developed, well nourished, in no apparent distress Heart: RRR, no bruits, no LE edema Lungs: clear to auscultation, no accessory muscle use Psych: well oriented with normal range of affect and appropriate judgment/insight  Essential hypertension - Plan: amLODipine-valsartan (EXFORGE) 10-320 MG tablet  Snoring - Plan: Ambulatory referral to Pulmonology  Chronic, unstable. Change to Exforge 10-320 mg/d. Increasing amlodipine dosage and maxing ACEi/ARB. Counseled on diet and exercise. F/u in 2 weeks to reck. Refer pulm for OSA eval.  The patient voiced understanding and agreement to the plan.  [place], DO 03/07/21  10:06 AM

## 2021-03-17 ENCOUNTER — Encounter: Payer: Self-pay | Admitting: Adult Health

## 2021-03-17 ENCOUNTER — Ambulatory Visit (INDEPENDENT_AMBULATORY_CARE_PROVIDER_SITE_OTHER): Payer: BC Managed Care – PPO | Admitting: Adult Health

## 2021-03-17 DIAGNOSIS — G4719 Other hypersomnia: Secondary | ICD-10-CM | POA: Diagnosis not present

## 2021-03-17 DIAGNOSIS — E668 Other obesity: Secondary | ICD-10-CM

## 2021-03-17 NOTE — Assessment & Plan Note (Signed)
Healthy weight loss discussed 

## 2021-03-17 NOTE — Progress Notes (Signed)
? ?@Patient  ID: Deanna Powell, female    DOB: 30-Jun-1980, 41 y.o.   MRN: [id number] ? ?Chief Complaint  ?Patient presents with  ? Consult  ? ? ?Referring provider: ?Shelda Pal* ? ?HPI: ?41 year old female seen for sleep consult March 17, 2021 for snoring, daytime sleepiness and restless sleep ?Medical history significant for Bipolar  ? ? ?TEST/EVENTS :  ? ?03/17/2021 Sleep consult ?Patient presents for sleep consult today.  She complains of snoring, daytime sleepiness and restless sleep.  Epworth score was 13.  She predominately gets sleepy when she is an active.  Patient says she has a lot of trouble going to sleep.  She says she snores so loudly that it wakes herself up.  She typically only gets about 4 hours of sleep a day.  She is very restless.  Typically goes to bed late at night around 5 AM.  And has to get up about 9 AM.  Says that she feels that she goes days without sleeping very much at all.  Says she is up and down all night long.  Says she has never had a sleep study.  She says her weight fluctuates.  She does not operate heavy machinery. ?Works 3 pm to 3 am at [employer].  ?Caffeine intake minimal .  ?No symptoms suspicious for cataplexy or sleep paralysis. ?Says she has Anxiety, chronic insomnia and PTSD .  ?Has been having difficult to control hypertension.  ? ?Social history patient works as a Information systems manager.  She is single.  She does have children-adult . Never smoker . Social alcohol. No drugs.  ? ?Family history positive for Asthma , DM ,  ? ?PMH : Hypertension, Bipolar , Anxiety , PTSD.  ? ?Surgery -hysterectomy 2012  ? ? ?No Known Allergies ? ?Immunization History  ?Administered Date(s) Administered  ? Influenza, Seasonal, Injecte, Preservative Fre 11/26/2014  ? Influenza,inj,Quad PF,6+ Mos 12/15/2016  ? Tdap 02/02/2021  ? ? ?Past Medical History:  ?Diagnosis Date  ? Anxiety   ? Bipolar 1 disorder ([place])   ? PTSD (post-traumatic stress disorder)   ? ? ?Tobacco History: ?Social History   ? ?Tobacco Use  ?Smoking Status Never  ?Smokeless Tobacco Never  ? ?Counseling given: Not Answered ? ? ?Outpatient Medications Prior to Visit  ?Medication Sig Dispense Refill  ? albuterol (VENTOLIN HFA) 108 (90 Base) MCG/ACT inhaler Inhale 1-2 puffs into the lungs every 6 (six) hours as needed for wheezing or shortness of breath. 8 g 0  ? amLODipine-valsartan (EXFORGE) 10-320 MG tablet Take 1 tablet by mouth daily. 30 tablet 2  ? ARIPiprazole (ABILIFY) 10 MG tablet Take 1 tablet (10 mg total) by mouth daily. 30 tablet 1  ? fluticasone (FLONASE) 50 MCG/ACT nasal spray Place 2 sprays into both nostrils daily. 11.1 mL 2  ? Fluticasone Propionate, Inhal, (FLOVENT DISKUS) 100 MCG/ACT AEPB Inhale 2 puffs by mouth twice daily. Rinse out your mouth after the 2 puffs. 60 each 0  ? hydrOXYzine (VISTARIL) 25 MG capsule Take one capsule by mouth in the morning and two at bed time 90 capsule 1  ? lamoTRIgine (LAMICTAL) 25 MG tablet Take one tablet by mouth daily for one week and then 2 tab daily 60 tablet 1  ? ?No facility-administered medications prior to visit.  ? ? ? ?Review of Systems:  ? ?Constitutional:   No  weight loss, night sweats,  Fevers, chills, ?+ fatigue, or  lassitude. ? ?HEENT:   No headaches,  Difficulty swallowing,  Tooth/dental problems, or  Sore throat,  ?              No sneezing, itching, ear ache, nasal congestion, post nasal drip,  ? ?CV:  No chest pain,  Orthopnea, PND, swelling in lower extremities, anasarca, dizziness, palpitations, syncope.  ? ?GI  No heartburn, indigestion, abdominal pain, nausea, vomiting, diarrhea, change in bowel habits, loss of appetite, bloody stools.  ? ?Resp: No shortness of breath with exertion or at rest.  No excess mucus, no productive cough,  No non-productive cough,  No coughing up of blood.  No change in color of mucus.  No wheezing.  No chest wall deformity ? ?Skin: no rash or lesions. ? ?GU: no dysuria, change in color of urine, no urgency or frequency.  No flank  pain, no hematuria  ? ?MS:  No joint pain or swelling.  No decreased range of motion.  No back pain. ? ? ? ?Physical Exam ? ?BP 126/78 (BP Location: Left Arm, Patient Position: Sitting, Cuff Size: Large)   Pulse 81   Temp 98.2 ?F (36.8 ?C) (Oral)   Ht 5\' 6"  (1.676 m)   Wt 212 lb (96.2 kg)   SpO2 98%   BMI 34.22 kg/m?  ? ?GEN: A/Ox3; pleasant , NAD, well nourished  ?  ?HEENT:  Balm/AT,  EACs-clear, TMs-wnl, NOSE-clear, THROAT-clear, no lesions, no postnasal drip or exudate noted. Class 3 MP airway  ? ?NECK:  Supple w/ fair ROM; no JVD; normal carotid impulses w/o bruits; no thyromegaly or nodules palpated; no lymphadenopathy.   ? ?RESP  Clear  P & A; w/o, wheezes/ rales/ or rhonchi. no accessory muscle use, no dullness to percussion ? ?CARD:  RRR, no m/r/g, no peripheral edema, pulses intact, no cyanosis or clubbing. ? ?GI:   Soft & nt; nml bowel sounds; no organomegaly or masses detected.  ? ?Musco: Warm bil, no deformities or joint swelling noted.  ? ?Neuro: alert, no focal deficits noted.   ? ?Skin: Warm, no lesions or rashes ? ? ? ? ? ?BNP ?No results found for: BNP ? ?ProBNP ?No results found for: PROBNP ? ?Imaging: ?No results found. ? ? ? ?No flowsheet data found. ? ?No results found for: NITRICOXIDE ? ? ? ? ? ?Assessment & Plan:  ? ?Excessive daytime sleepiness ?Excessive daytime sleepiness, snoring, restless sleep all suspicious for underlying sleep apnea.  We will set patient up for home sleep study.  Patient education was given on healthy sleep regimen ? ?- discussed how weight can impact sleep and risk for sleep disordered breathing ?- discussed options to assist with weight loss: combination of diet modification, cardiovascular and strength training exercises ?  ?- had an extensive discussion regarding the adverse health consequences related to untreated sleep disordered breathing ?- specifically discussed the risks for hypertension, coronary artery disease, cardiac dysrhythmias, cerebrovascular  disease, and diabetes ?- lifestyle modification discussed ?  ?- discussed how sleep disruption can increase risk of accidents, particularly when driving ?- safe driving practices were discussed ?  ? ?Plan ?Patient Instructions  ?Set up for home sleep study ?Work on healthy weight loss ?Remain active ?Do not drive if sleepy ?Follow-up in 6 weeks to discuss sleep study results ?  ? ? ?Moderate obesity ?Healthy weight loss discussed ? ? ? ?Rexene Edison, NP ?03/17/2021 ? ?

## 2021-03-17 NOTE — Progress Notes (Signed)
Reviewed and agree with assessment/plan. ? ? ?Jakala Herford, MD ?[facility] ?03/17/2021, 11:59 AM ?Pager:  [phone] ? ?

## 2021-03-17 NOTE — Patient Instructions (Signed)
Set up for home sleep study ?Work on healthy weight loss ?Remain active ?Do not drive if sleepy ?Follow-up in 6 weeks to discuss sleep study results ?

## 2021-03-17 NOTE — Assessment & Plan Note (Signed)
Excessive daytime sleepiness, snoring, restless sleep all suspicious for underlying sleep apnea.  We will set patient up for home sleep study.  Patient education was given on healthy sleep regimen ? ?- discussed how weight can impact sleep and risk for sleep disordered breathing ?- discussed options to assist with weight loss: combination of diet modification, cardiovascular and strength training exercises ?  ?- had an extensive discussion regarding the adverse health consequences related to untreated sleep disordered breathing ?- specifically discussed the risks for hypertension, coronary artery disease, cardiac dysrhythmias, cerebrovascular disease, and diabetes ?- lifestyle modification discussed ?  ?- discussed how sleep disruption can increase risk of accidents, particularly when driving ?- safe driving practices were discussed ?  ? ?Plan ?Patient Instructions  ?Set up for home sleep study ?Work on healthy weight loss ?Remain active ?Do not drive if sleepy ?Follow-up in 6 weeks to discuss sleep study results ?  ? ?

## 2021-03-22 ENCOUNTER — Other Ambulatory Visit: Payer: BC Managed Care – PPO

## 2021-03-23 ENCOUNTER — Encounter: Payer: Self-pay | Admitting: Family Medicine

## 2021-03-23 ENCOUNTER — Ambulatory Visit: Payer: BC Managed Care – PPO | Admitting: Family Medicine

## 2021-04-05 ENCOUNTER — Ambulatory Visit: Payer: BC Managed Care – PPO | Admitting: Nurse Practitioner

## 2021-04-18 ENCOUNTER — Encounter: Payer: Self-pay | Admitting: Family

## 2021-04-18 ENCOUNTER — Ambulatory Visit (INDEPENDENT_AMBULATORY_CARE_PROVIDER_SITE_OTHER): Payer: BC Managed Care – PPO | Admitting: Family

## 2021-04-18 VITALS — BP 146/98 | HR 76 | Temp 97.5°F | Ht 66.5 in | Wt 213.0 lb

## 2021-04-18 DIAGNOSIS — Z7689 Persons encountering health services in other specified circumstances: Secondary | ICD-10-CM | POA: Diagnosis not present

## 2021-04-18 DIAGNOSIS — Z6833 Body mass index (BMI) 33.0-33.9, adult: Secondary | ICD-10-CM

## 2021-04-18 DIAGNOSIS — E782 Mixed hyperlipidemia: Secondary | ICD-10-CM

## 2021-04-18 DIAGNOSIS — I10 Essential (primary) hypertension: Secondary | ICD-10-CM | POA: Diagnosis not present

## 2021-04-18 DIAGNOSIS — E6609 Other obesity due to excess calories: Secondary | ICD-10-CM

## 2021-04-18 NOTE — Progress Notes (Signed)
? ?Provider: [name] FNP-C  ? ?Deanna Dory, DO ? ?Patient Care Team: ?Deanna Dory, DO as PCP - General (Family Medicine) ? ?Extended Emergency Contact Information ?Primary Emergency Contact: Deanna Powell ?Mobile Phone: [phone] ?Relation: Son ?Secondary Emergency Contact: Deanna Powell ?Mobile Phone: [phone] ?Relation: Relative ? ?Code Status:  Full Code  ?Goals of care: Advanced Directive information ? ?  01/12/2021  ?  3:05 PM  ?Advanced Directives  ?Does Patient Have a Medical Advance Directive? No  ? ? ? ?Chief Complaint  ?Patient presents with  ? Establish Care  ? Acute Visit  ? Manic Behavior  ? Post-Traumatic Stress Disorder  ? Insomnia  ? Blood Pressure Check  ? ? ?HPI:  ?Pt is a 41 y.o. female seen today establish care here at [facility] for medical management of chronic diseases.Has a medical History of Hypertension, asthma, bipolar disorder,Hyperlipidemia,Insomnia, PTSD among other conditions. ?She is concerned about weight would like a recommendation for weight loss.Discussed referral to weight management. ?Has had no recent hospitalization. ? ? ?Past Medical History:  ?Diagnosis Date  ? Anxiety   ? Bipolar 1 disorder ([facility])   ? PTSD (post-traumatic stress disorder)   ? ?Past Surgical History:  ?Procedure Laterality Date  ? ABDOMINAL HYSTERECTOMY    ? Fibroids/heavy menses  ? ? ?No Known Allergies ? ?Allergies as of 04/18/2021   ?No Known Allergies ?  ? ?  ?Medication List  ?  ? ?  ? Accurate as of April 18, 2021 11:19 AM. If you have any questions, ask your nurse or doctor.  ?  ?  ? ?  ? ?albuterol 108 (90 Base) MCG/ACT inhaler ?Commonly known as: VENTOLIN HFA ?Inhale 1-2 puffs into the lungs every 6 (six) hours as needed for wheezing or shortness of breath. ?  ?amLODipine-valsartan 10-320 MG tablet ?Commonly known as: EXFORGE ?Take 1 tablet by mouth daily. ?  ?ARIPiprazole 10 MG tablet ?Commonly known as: ABILIFY ?Take 1 tablet (10 mg total)  by mouth daily. ?  ?Flovent Diskus 100 MCG/ACT Aepb ?Generic drug: Fluticasone Propionate (Inhal) ?Inhale 2 puffs by mouth twice daily. Rinse out your mouth after the 2 puffs. ?  ?fluticasone 50 MCG/ACT nasal spray ?Commonly known as: FLONASE ?Place 2 sprays into both nostrils daily. ?  ?hydrOXYzine 25 MG capsule ?Commonly known as: Vistaril ?Take one capsule by mouth in the morning and two at bed time ?  ?lamoTRIgine 25 MG tablet ?Commonly known as: LaMICtal ?Take one tablet by mouth daily for one week and then 2 tab daily ?  ? ?  ? ? ?Review of Systems  ?Constitutional:  Negative for appetite change, chills, fatigue, fever and unexpected weight change.  ?HENT:  Negative for congestion, dental problem, ear discharge, ear pain, facial swelling, hearing loss, nosebleeds, postnasal drip, rhinorrhea, sinus pressure, sinus pain, sneezing, sore throat, tinnitus and trouble swallowing.   ?Eyes:  Negative for pain, discharge, redness, itching and visual disturbance.  ?Respiratory:  Negative for cough, chest tightness, shortness of breath and wheezing.   ?Cardiovascular:  Negative for chest pain, palpitations and leg swelling.  ?Gastrointestinal:  Negative for abdominal distention, abdominal pain, blood in stool, constipation, diarrhea, nausea and vomiting.  ?Endocrine: Negative for cold intolerance, heat intolerance, polydipsia, polyphagia and polyuria.  ?Genitourinary:  Negative for difficulty urinating, dysuria, flank pain, frequency and urgency.  ?Musculoskeletal:  Negative for arthralgias, back pain, gait problem, joint swelling, myalgias, neck pain and neck stiffness.  ?Skin:  Negative for color change, pallor, rash and  wound.  ?Neurological:  Negative for dizziness, syncope, speech difficulty, weakness, light-headedness, numbness and headaches.  ?Hematological:  Does not bruise/bleed easily.  ?Psychiatric/Behavioral:  Negative for agitation, behavioral problems, confusion, hallucinations, self-injury, sleep  disturbance and suicidal ideas. The patient is not nervous/anxious.   ? ?Immunization History  ?Administered Date(s) Administered  ? Influenza, Seasonal, Injecte, Preservative Fre 11/26/2014  ? Influenza,inj,Quad PF,6+ Mos 12/15/2016  ? Tdap 02/02/2021  ? ?Pertinent  Health Maintenance Due  ?Topic Date Due  ? PAP SMEAR-Modifier  06/29/2018  ? INFLUENZA VACCINE  08/16/2021  ? ? ?  01/11/2016  ? 10:05 AM 02/17/2016  ? 12:09 PM 10/16/2020  ? 10:42 PM 01/12/2021  ?  3:06 PM 02/20/2021  ?  5:50 PM  ?Fall Risk  ?Falls in the past year? No No     ?Patient Fall Risk Level   Low fall risk Low fall risk Low fall risk  ? ?Functional Status Survey: ?  ? ?Vitals:  ? 04/18/21 1059  ?BP: (!) 146/98  ?Pulse: 76  ?Temp: (!) 97.5 ?F (36.4 ?C)  ?SpO2: 98%  ?Weight: 213 lb (96.6 kg)  ?Height: 5' 6.5" (1.689 m)  ? ?Body mass index is 33.86 kg/m?Marland Kitchen ?Physical Exam ?Vitals reviewed.  ?Constitutional:   ?   General: She is not in acute distress. ?   Appearance: Normal appearance. She is obese. She is not ill-appearing or diaphoretic.  ?HENT:  ?   Head: Normocephalic.  ?   Right Ear: Tympanic membrane, ear canal and external ear normal. There is no impacted cerumen.  ?   Left Ear: Tympanic membrane, ear canal and external ear normal. There is no impacted cerumen.  ?   Nose: Nose normal. No congestion or rhinorrhea.  ?   Mouth/Throat:  ?   Mouth: Mucous membranes are moist.  ?   Pharynx: Oropharynx is clear. No oropharyngeal exudate or posterior oropharyngeal erythema.  ?Eyes:  ?   General: No scleral icterus.    ?   Right eye: No discharge.     ?   Left eye: No discharge.  ?   Extraocular Movements: Extraocular movements intact.  ?   Conjunctiva/sclera: Conjunctivae normal.  ?   Pupils: Pupils are equal, round, and reactive to light.  ?Neck:  ?   Vascular: No carotid bruit.  ?Cardiovascular:  ?   Rate and Rhythm: Normal rate and regular rhythm.  ?   Pulses: Normal pulses.  ?   Heart sounds: Normal heart sounds. No murmur heard. ?  No friction rub.  No gallop.  ?Pulmonary:  ?   Effort: Pulmonary effort is normal. No respiratory distress.  ?   Breath sounds: Normal breath sounds. No wheezing, rhonchi or rales.  ?Chest:  ?   Chest wall: No tenderness.  ?Abdominal:  ?   General: Bowel sounds are normal. There is no distension.  ?   Palpations: Abdomen is soft. There is no mass.  ?   Tenderness: There is no abdominal tenderness. There is no right CVA tenderness, left CVA tenderness, guarding or rebound.  ?Musculoskeletal:     ?   General: No swelling or tenderness. Normal range of motion.  ?   Cervical back: Normal range of motion. No rigidity or tenderness.  ?   Right lower leg: No edema.  ?   Left lower leg: No edema.  ?Lymphadenopathy:  ?   Cervical: No cervical adenopathy.  ?Skin: ?   General: Skin is warm and dry.  ?   Coloration: Skin  is not pale.  ?   Findings: No bruising, erythema, lesion or rash.  ?Neurological:  ?   Mental Status: She is alert and oriented to person, place, and time.  ?   Cranial Nerves: No cranial nerve deficit.  ?   Sensory: No sensory deficit.  ?   Motor: No weakness.  ?   Coordination: Coordination normal.  ?   Gait: Gait normal.  ?Psychiatric:     ?   Mood and Affect: Mood normal.     ?   Speech: Speech normal.     ?   Behavior: Behavior normal.     ?   Thought Content: Thought content normal.     ?   Judgment: Judgment normal.  ? ? ?Labs reviewed: ?Recent Labs  ?  10/16/20 ?2248 02/02/21 ?1158 02/20/21 ?1840  ?NA 138 141 138  ?K 3.8 3.6 3.9  ?CL 105 105 102  ?CO2 25 28 28   ?GLUCOSE 95 97 94  ?BUN 9 8 10   ?CREATININE 1.00 0.83 0.76  ?CALCIUM 9.3 8.7 9.0  ? ?Recent Labs  ?  02/02/21 ?1158  ?AST 15  ?ALT 14  ?ALKPHOS 56  ?BILITOT 0.3  ?PROT 6.9  ?ALBUMIN 4.1  ? ?Recent Labs  ?  10/16/20 ?2248 02/02/21 ?1158 02/20/21 ?1840  ?WBC 6.0 5.6 6.0  ?NEUTROABS  --   --  3.7  ?HGB 11.6* 11.3* 11.4*  ?HCT 38.7 36.1 38.3  ?MCV 77.6* 72.6* 75.0*  ?PLT 296 264.0 267  ? ?No results found for: TSH ?No results found for: HGBA1C ?Lab Results   ?Component Value Date  ? CHOL 163 02/02/2021  ? HDL 38.40 (L) 02/02/2021  ? LDLCALC 109 (H) 02/02/2021  ? TRIG 78.0 02/02/2021  ? CHOLHDL 4 02/02/2021  ? ? ?Significant Diagnostic Results in last 30 days:  ?No results

## 2021-04-26 ENCOUNTER — Encounter (HOSPITAL_BASED_OUTPATIENT_CLINIC_OR_DEPARTMENT_OTHER): Payer: Self-pay

## 2021-04-26 ENCOUNTER — Other Ambulatory Visit: Payer: Self-pay

## 2021-04-26 ENCOUNTER — Emergency Department (HOSPITAL_BASED_OUTPATIENT_CLINIC_OR_DEPARTMENT_OTHER): Payer: BC Managed Care – PPO

## 2021-04-26 ENCOUNTER — Emergency Department (HOSPITAL_BASED_OUTPATIENT_CLINIC_OR_DEPARTMENT_OTHER)
Admission: EM | Admit: 2021-04-26 | Discharge: 2021-04-26 | Disposition: A | Discharge status: Home / Self Care | Payer: BC Managed Care – PPO | Attending: Emergency Medicine | Admitting: Emergency Medicine

## 2021-04-26 DIAGNOSIS — R42 Dizziness and giddiness: Secondary | ICD-10-CM | POA: Insufficient documentation

## 2021-04-26 DIAGNOSIS — Z79899 Other long term (current) drug therapy: Secondary | ICD-10-CM | POA: Insufficient documentation

## 2021-04-26 DIAGNOSIS — I10 Essential (primary) hypertension: Secondary | ICD-10-CM | POA: Insufficient documentation

## 2021-04-26 DIAGNOSIS — H1131 Conjunctival hemorrhage, right eye: Secondary | ICD-10-CM | POA: Diagnosis not present

## 2021-04-26 DIAGNOSIS — R519 Headache, unspecified: Secondary | ICD-10-CM | POA: Insufficient documentation

## 2021-04-26 DIAGNOSIS — R809 Proteinuria, unspecified: Secondary | ICD-10-CM | POA: Diagnosis not present

## 2021-04-26 DIAGNOSIS — R000 Tachycardia, unspecified: Secondary | ICD-10-CM | POA: Insufficient documentation

## 2021-04-26 DIAGNOSIS — R079 Chest pain, unspecified: Secondary | ICD-10-CM | POA: Diagnosis not present

## 2021-04-26 DIAGNOSIS — R0602 Shortness of breath: Secondary | ICD-10-CM | POA: Insufficient documentation

## 2021-04-26 LAB — URINALYSIS, ROUTINE W REFLEX MICROSCOPIC
Glucose, UA: NEGATIVE mg/dL
Hgb urine dipstick: NEGATIVE
Ketones, ur: NEGATIVE mg/dL
Leukocytes,Ua: NEGATIVE
Nitrite: NEGATIVE
Protein, ur: 100 mg/dL — AB
Specific Gravity, Urine: >=1.03 (ref 1.005–1.030)
pH: 5.5 (ref 5.0–8.0)

## 2021-04-26 LAB — CBC WITH DIFFERENTIAL/PLATELET
Abs Immature Granulocytes: 0.03 10*3/uL (ref 0.00–0.07)
Basophils Absolute: 0.1 10*3/uL (ref 0.0–0.1)
Basophils Relative: 1 %
Eosinophils Absolute: 0.1 10*3/uL (ref 0.0–0.5)
Eosinophils Relative: 1 %
HCT: 40.5 % (ref 36.0–46.0)
Hemoglobin: 12.5 g/dL (ref 12.0–15.0)
Immature Granulocytes: 1 %
Lymphocytes Relative: 27 %
Lymphs Abs: 1.8 10*3/uL (ref 0.7–4.0)
MCH: 22.7 pg — ABNORMAL LOW (ref 26.0–34.0)
MCHC: 30.9 g/dL (ref 30.0–36.0)
MCV: 73.6 fL — ABNORMAL LOW (ref 80.0–100.0)
Monocytes Absolute: 0.3 10*3/uL (ref 0.1–1.0)
Monocytes Relative: 4 %
Neutro Abs: 4.4 10*3/uL (ref 1.7–7.7)
Neutrophils Relative %: 66 %
Platelets: 319 10*3/uL (ref 150–400)
RBC: 5.5 MIL/uL — ABNORMAL HIGH (ref 3.87–5.11)
RDW: 15.8 % — ABNORMAL HIGH (ref 11.5–15.5)
WBC: 6.6 10*3/uL (ref 4.0–10.5)
nRBC: 0 % (ref 0.0–0.2)

## 2021-04-26 LAB — BASIC METABOLIC PANEL
Anion gap: 8 (ref 5–15)
BUN: 10 mg/dL (ref 6–20)
CO2: 26 mmol/L (ref 22–32)
Calcium: 9.3 mg/dL (ref 8.9–10.3)
Chloride: 103 mmol/L (ref 98–111)
Creatinine, Ser: 0.92 mg/dL (ref 0.44–1.00)
GFR, Estimated: >60 mL/min (ref >60)
Glucose, Bld: 141 mg/dL — ABNORMAL HIGH (ref 70–99)
Potassium: 3.6 mmol/L (ref 3.5–5.1)
Sodium: 137 mmol/L (ref 135–145)

## 2021-04-26 LAB — URINALYSIS, MICROSCOPIC (REFLEX)

## 2021-04-26 LAB — PREGNANCY, URINE: Preg Test, Ur: NEGATIVE

## 2021-04-26 LAB — TROPONIN I (HIGH SENSITIVITY): Troponin I (High Sensitivity): 8 ng/L (ref <18)

## 2021-04-26 MED ORDER — ACETAMINOPHEN 325 MG PO TABS
650.0000 mg | ORAL_TABLET | Freq: Once | ORAL | Status: AC
  Administered 2021-04-26: 650 mg via ORAL
  Filled 2021-04-26: qty 2

## 2021-04-26 NOTE — Discharge Instructions (Signed)
Your work-up today was reassuring.  Please follow-up with your primary care doctor in the next few days to possibly change your blood pressure medication dose.  Drink plenty of fluids throughout the day, take Tylenol for the headache. ?

## 2021-04-26 NOTE — ED Notes (Signed)
Written and verbal inst to pt  Verbalized an understanding  To home  

## 2021-04-26 NOTE — ED Provider Notes (Signed)
?[facility] EMERGENCY DEPARTMENT ?Provider Note ? ? ?CSN: [id number] ?Arrival date & time: 04/26/21  1624 ? ?  ? ?History ? ?Chief Complaint  ?Patient presents with  ? Dizziness  ? ? ?Deanna Powell is a 41 y.o. female. ? ?The history is provided by the patient.  ?Dizziness ? ?Patient with medical history notable for hypertension presents today due to multiple complaints.  Patient states she was at work earlier, her blood pressure has been high despite taking the medicine she is prescribed.  She states she felt dizzy as if she was going to pass out, started feeling short of breath and had some chest pain.  She currently feels dizzy and lightheaded, has not had anything for it.  She has a headache which was the worst headache of her life, associated with blurry vision to her left eye.  States she atraumatically noticed a sudden conjunctival hemorrhage to the left eye.  The headache is worse whenever she moves her eye, not associated with nausea or vomiting. ? ?Home Medications ?Prior to Admission medications   ?Medication Sig Start Date End Date Taking? Authorizing Provider  ?albuterol (VENTOLIN HFA) 108 (90 Base) MCG/ACT inhaler Inhale 1-2 puffs into the lungs every 6 (six) hours as needed for wheezing or shortness of breath. 01/12/21   Henderly, Britni A, PA-C  ?amLODipine-valsartan (EXFORGE) 10-320 MG tablet Take 1 tablet by mouth daily. 03/07/21   [name], DO  ?ARIPiprazole (ABILIFY) 10 MG tablet Take 1 tablet (10 mg total) by mouth daily. 01/26/21   [name], DO  ?fluticasone (FLONASE) 50 MCG/ACT nasal spray Place 2 sprays into both nostrils daily. 01/12/21   Henderly, Britni A, PA-C  ?Fluticasone Propionate, Inhal, (FLOVENT DISKUS) 100 MCG/ACT AEPB Inhale 2 puffs by mouth twice daily. Rinse out your mouth after the 2 puffs. 01/26/21   [name], DO  ?hydrOXYzine (VISTARIL) 25 MG capsule Take one capsule by mouth in the morning and two at bed time 01/26/21    [name], DO  ?lamoTRIgine (LAMICTAL) 25 MG tablet Take one tablet by mouth daily for one week and then 2 tab daily 01/26/21   [name], DO  ?   ? ?Allergies    ?Patient has no known allergies.   ? ?Review of Systems   ?Review of Systems  ?Neurological:  Positive for dizziness.  ? ?Physical Exam ?Updated Vital Signs ?BP (!) 159/115 (BP Location: Left Arm)   Pulse 82   Temp 98.3 ?F (36.8 ?C) (Oral)   Resp 18   Ht 5\' 6"  (1.676 m)   Wt 96.6 kg   SpO2 100%   BMI 34.38 kg/m?  ?Physical Exam ?Vitals and nursing note reviewed. Exam conducted with a chaperone present.  ?Constitutional:   ?   Appearance: Normal appearance.  ?HENT:  ?   Head: Normocephalic and atraumatic.  ?Eyes:  ?   General: No scleral icterus.    ?   Right eye: No discharge.     ?   Left eye: No discharge.  ?   Extraocular Movements: Extraocular movements intact.  ?   Pupils: Pupils are equal, round, and reactive to light.  ?   Comments: Subconjunctival hemorrhage noted to the left eye.  EOMI, no nystagmus  ?Cardiovascular:  ?   Rate and Rhythm: Normal rate and regular rhythm.  ?   Pulses: Normal pulses.  ?   Heart sounds: Normal heart sounds. No murmur heard. ?  No friction rub. No gallop.  ?  Pulmonary:  ?   Effort: Pulmonary effort is normal. No respiratory distress.  ?   Breath sounds: Normal breath sounds.  ?   Comments: Lungs clear to auscultation bilaterally ?Abdominal:  ?   General: Abdomen is flat. Bowel sounds are normal. There is no distension.  ?   Palpations: Abdomen is soft.  ?   Tenderness: There is no abdominal tenderness.  ?Skin: ?   General: Skin is warm and dry.  ?   Coloration: Skin is not jaundiced.  ?Neurological:  ?   Mental Status: She is alert. Mental status is at baseline.  ?   Coordination: Coordination normal.  ?   Comments: Cranial nerves III through XII are grossly intact, grip strength equal bilaterally and able to raise both lower extremities without difficulty.  ? ? ?ED Results / Procedures  / Treatments   ?Labs ?(all labs ordered are listed, but only abnormal results are displayed) ?Labs Reviewed  ?CBC WITH DIFFERENTIAL/PLATELET - Abnormal; Notable for the following components:  ?    Result Value  ? RBC 5.50 (*)   ? MCV 73.6 (*)   ? MCH 22.7 (*)   ? RDW 15.8 (*)   ? All other components within normal limits  ?BASIC METABOLIC PANEL - Abnormal; Notable for the following components:  ? Glucose, Bld 141 (*)   ? All other components within normal limits  ?URINALYSIS, ROUTINE W REFLEX MICROSCOPIC - Abnormal; Notable for the following components:  ? Bilirubin Urine SMALL (*)   ? Protein, ur 100 (*)   ? All other components within normal limits  ?URINALYSIS, MICROSCOPIC (REFLEX) - Abnormal; Notable for the following components:  ? Bacteria, UA FEW (*)   ? All other components within normal limits  ?PREGNANCY, URINE  ?TROPONIN I (HIGH SENSITIVITY)  ? ? ?EKG ?None ? ?Radiology ?DG Chest 2 View ? ?Result Date: 04/26/2021 ?CLINICAL DATA:  Shortness of breath.  Dizziness.  Headache. EXAM: CHEST - 2 VIEW COMPARISON:  Chest two views 01/12/2021 FINDINGS: Cardiac silhouette and mediastinal contours are within normal limits. There are again moderately decreased lung volumes. No pleural effusion or pneumothorax. No acute skeletal abnormality. IMPRESSION: No active cardiopulmonary disease. Electronically Signed   By: Neita Garnetonald  Viola M.D.   On: 04/26/2021 20:59  ? ?CT Head Wo Contrast ? ?Result Date: 04/26/2021 ?CLINICAL DATA:  Headache, new or worsening, neuro deficit (Age 43-49y) EXAM: CT HEAD WITHOUT CONTRAST TECHNIQUE: Contiguous axial images were obtained from the base of the skull through the vertex without intravenous contrast. RADIATION DOSE REDUCTION: This exam was performed according to the departmental dose-optimization program which includes automated exposure control, adjustment of the mA and/or kV according to patient size and/or use of iterative reconstruction technique. COMPARISON:  None. FINDINGS: Brain: No  intracranial hemorrhage, mass effect, or midline shift. No hydrocephalus. The basilar cisterns are patent. No evidence of territorial infarct or acute ischemia. No extra-axial or intracranial fluid collection. Vascular: No hyperdense vessel or unexpected calcification. Skull: No fracture or focal lesion. Sinuses/Orbits: Lobulated mucosal thickening of both maxillary sinuses without fluid level. Unremarkable orbits. No mastoid effusion. Other: None. IMPRESSION: 1. No acute intracranial abnormality. 2. Bilateral maxillary sinus mucosal thickening. Electronically Signed   By: Narda RutherfordMelanie  Sanford M.D.   On: 04/26/2021 20:58   ? ?Procedures ?Procedures  ? ? ?Medications Ordered in ED ?Medications  ?acetaminophen (TYLENOL) tablet 650 mg (650 mg Oral Given 04/26/21 2102)  ? ? ?ED Course/ Medical Decision Making/ A&P ?  ?                        ?  Medical Decision Making ?Amount and/or Complexity of Data Reviewed ?Labs: ordered. ?Radiology: ordered. ? ?Risk ?OTC drugs. ? ? ?This patient presents to the ED for concern of chest pain, shortness of breath, headache, dizziness, this involves an extensive number of treatment options, and is a complaint that carries with it a high risk of complications and morbidity.  The differential diagnosis includes but not limited to ACS, pneumonia, PE, AKI, subarachnoid, ? ? ?Additional history obtained:  ? ?Reviewed patient's medical records, she is currently on amlodipine-valsartan. ?  ?Lab Tests: ? ?I ordered, viewed, and personally interpreted labs.  The pertinent results include: No leukocytosis or anemia.  No gross electrolyte derangement or AKI.  Creatinine at baseline, UA with slight proteinuria but no underlying UTI.  Pregnancy is negative.  Troponin 8. ? ?  ?Imaging Studies ordered: ? ?I directly visualized the chest x-ray and CT head which showed no acute process.,  ? ?I agree with the radiologist interpretation ?  ? ?ECG/Cardiac monitoring:  ? ?Per my interpretation, EKG shows slight  sinus tach with a heart rate of 101, there is slight LVH, consistent with persistent hypertension ? ? ?Medicines ordered and prescription drug management: ? ?I ordered medication including: tylenol   ? ?I have rev

## 2021-04-26 NOTE — ED Notes (Signed)
Ambulatory to room without difficulty 

## 2021-04-26 NOTE — ED Triage Notes (Signed)
Pt c/o dizziness, SOB & headache starting this afternoon. BP found to by hypertensive at work. No other neurological deficits. Takes amlodipine-valsartan, last dose this morning. ?

## 2021-04-26 NOTE — ED Notes (Signed)
Patient transported to X-ray 

## 2021-04-27 ENCOUNTER — Ambulatory Visit (INDEPENDENT_AMBULATORY_CARE_PROVIDER_SITE_OTHER): Payer: BC Managed Care – PPO | Admitting: Family

## 2021-04-27 ENCOUNTER — Encounter: Payer: Self-pay | Admitting: Family

## 2021-04-27 VITALS — BP 140/120 | HR 96 | Temp 97.8°F | Resp 18 | Ht 66.0 in | Wt 212.6 lb

## 2021-04-27 DIAGNOSIS — Z124 Encounter for screening for malignant neoplasm of cervix: Secondary | ICD-10-CM

## 2021-04-27 DIAGNOSIS — H1132 Conjunctival hemorrhage, left eye: Secondary | ICD-10-CM

## 2021-04-27 DIAGNOSIS — I10 Essential (primary) hypertension: Secondary | ICD-10-CM | POA: Diagnosis not present

## 2021-04-27 MED ORDER — HYDRALAZINE HCL 25 MG PO TABS: 25.0000 mg | ORAL_TABLET | Freq: Three times a day (TID) | ORAL | 0 refills | Status: DC

## 2021-04-27 MED ORDER — CLONIDINE HCL 0.1 MG PO TABS
0.1000 mg | ORAL_TABLET | Freq: Once | ORAL | Status: AC
  Administered 2021-04-27: 0.1 mg via ORAL

## 2021-04-27 NOTE — Patient Instructions (Signed)
-    check Blood pressure at home and record on log provided and notify provider if B/p > 140/90  ? ?- Notify provider or go to ED if symptoms worsen ? ?

## 2021-04-27 NOTE — Progress Notes (Signed)
? ?Provider: [name] FNP-C ? ?Leigh Kaeding, [name], NP ? ?Patient Care Team: ?Garold Sheeler, [name], NP as PCP - General (Family Medicine) ? ?Extended Emergency Contact Information ?Primary Emergency Contact: [name],[name] ?Mobile Phone: [phone] ?Relation: Son ?Secondary Emergency Contact: [name],[name] ?Mobile Phone: [phone] ?Relation: Relative ? ?Code Status: Full Code  ?Goals of care: Advanced Directive information ? ?  04/26/2021  ?  4:32 PM  ?Advanced Directives  ?Does Patient Have a Medical Advance Directive? No  ?Would patient like information on creating a medical advance directive? No - Patient declined  ? ? ? ?Chief Complaint  ?Patient presents with  ? Follow-up  ?  PT is here for a Pap Smear, PT also has Iritis of the right eye since this morning and has elevated BP reading of 165/110 from ER yesterday  ? ? ?HPI:  ?Pt is a 41 y.o. female seen today for an acute visit for Pap smear. ?Also complains of left eye redness since yesterday.  She denies any pain. itching drainage or vision changes. Also denies any recent upper respiratory illness. ?She was seen yesterday in the ED after she presented with multiple complaints with high blood pressure despite taking her medication.  Also had concerns of chest pain and shortness of breath she complained of dizziness described as feeling like passing out. Also had headache which was  the worst headache in her life.  Headache was worse with movement of her eyes.  She was also noted to have conjunctival hemorrhage to left eye.  Blood pressure in the ED was 159/115 lab work done was unremarkable with creatinine at baseline.  Her UA showed slight proteinuria but no underlying urinary tract infection troponin was within normal.  Checks x-ray and CT scan of the head showed no acute abnormalities.  Her EKG showed slight sinus tachycardia with a heart rate of 101 and there was slight left ventricular hypertrophy consistent with persistent hypotension.  Overall  condition was reassuring and had no hypotensive, urgency she was discharged home. ?She denies any headache,dizziness,vision changes,fatigue,chest tightness,palpitation,chest pain or shortness of breath.   ?Blood pressure today continues to be elevated clonidine administered. ? ?Past Medical History:  ?Diagnosis Date  ? Anxiety   ? Bipolar 1 disorder (HCC)   ? PTSD (post-traumatic stress disorder)   ? ?Past Surgical History:  ?Procedure Laterality Date  ? ABDOMINAL HYSTERECTOMY    ? Fibroids/heavy menses  ? ? ?No Known Allergies ? ?Outpatient Encounter Medications as of 04/27/2021  ?Medication Sig  ? albuterol (VENTOLIN HFA) 108 (90 Base) MCG/ACT inhaler Inhale 1-2 puffs into the lungs every 6 (six) hours as needed for wheezing or shortness of breath.  ? amLODipine-valsartan (EXFORGE) 10-320 MG tablet Take 1 tablet by mouth daily.  ? ARIPiprazole (ABILIFY) 10 MG tablet Take 1 tablet (10 mg total) by mouth daily.  ? fluticasone (FLONASE) 50 MCG/ACT nasal spray Place 2 sprays into both nostrils daily.  ? Fluticasone Propionate, Inhal, (FLOVENT DISKUS) 100 MCG/ACT AEPB Inhale 2 puffs by mouth twice daily. Rinse out your mouth after the 2 puffs.  ? hydrOXYzine (VISTARIL) 25 MG capsule Take one capsule by mouth in the morning and two at bed time  ? lamoTRIgine (LAMICTAL) 25 MG tablet Take one tablet by mouth daily for one week and then 2 tab daily  ? ?No facility-administered encounter medications on file as of 04/27/2021.  ? ? ?Review of Systems  ?Constitutional:  Negative for appetite change, chills, fatigue, fever and unexpected weight change.  ?HENT:  Negative for  congestion, dental problem, ear discharge, ear pain, facial swelling, hearing loss, nosebleeds, postnasal drip, rhinorrhea, sinus pressure, sinus pain, sneezing, sore throat, tinnitus and trouble swallowing.   ?Eyes:  Negative for pain, discharge, redness, itching and visual disturbance.  ?Respiratory:  Negative for cough, chest tightness, shortness of breath  and wheezing.   ?Cardiovascular:  Negative for chest pain, palpitations and leg swelling.  ?Gastrointestinal:  Negative for abdominal distention, abdominal pain, blood in stool, constipation, diarrhea, nausea and vomiting.  ?Genitourinary:  Negative for difficulty urinating, dysuria, flank pain, frequency and urgency.  ?Musculoskeletal:  Negative for arthralgias, back pain, gait problem, joint swelling, myalgias, neck pain and neck stiffness.  ?Skin:  Negative for color change, pallor, rash and wound.  ?Neurological:  Negative for dizziness, syncope, speech difficulty, weakness, light-headedness, numbness and headaches.  ?Hematological:  Does not bruise/bleed easily.  ?Psychiatric/Behavioral:  Negative for agitation, behavioral problems, confusion, hallucinations, self-injury, sleep disturbance and suicidal ideas. The patient is not nervous/anxious.   ? ?Immunization History  ?Administered Date(s) Administered  ? Influenza, Seasonal, Injecte, Preservative Fre 11/26/2014  ? Influenza,inj,Quad PF,6+ Mos 12/15/2016  ? Tdap 02/02/2021  ? ?Pertinent  Health Maintenance Due  ?Topic Date Due  ? PAP SMEAR-Modifier  06/29/2018  ? INFLUENZA VACCINE  08/16/2021  ? ? ?  02/17/2016  ? 12:09 PM 10/16/2020  ? 10:42 PM 01/12/2021  ?  3:06 PM 02/20/2021  ?  5:50 PM 04/26/2021  ?  4:32 PM  ?Fall Risk  ?Falls in the past year? No      ?Patient Fall Risk Level  Low fall risk Low fall risk Low fall risk Low fall risk  ? ?Functional Status Survey: ?  ? ?Vitals:  ? 04/27/21 1056  ?BP: (!) 160/118  ?Pulse: 96  ?Resp: 18  ?Temp: 97.8 ?F (36.6 ?C)  ?SpO2: 97%  ?Weight: 212 lb 9.6 oz (96.4 kg)  ?Height: 5\' 6"  (1.676 m)  ? ?Body mass index is 34.31 kg/m?Marland Kitchen. ?Physical Exam ?Vitals reviewed. Exam conducted with a chaperone present (Evie Jones,CMA).  ?Constitutional:   ?   General: She is not in acute distress. ?   Appearance: Normal appearance. She is obese. She is not ill-appearing or diaphoretic.  ?HENT:  ?   Head: Normocephalic.  ?   Nose: Rhinorrhea  present.  ?   Mouth/Throat:  ?   Mouth: Mucous membranes are moist.  ?   Pharynx: Oropharynx is clear. No oropharyngeal exudate or posterior oropharyngeal erythema.  ?Eyes:  ?   General: No scleral icterus.    ?   Right eye: No discharge.     ?   Left eye: No discharge.  ?   Extraocular Movements: Extraocular movements intact.  ?   Conjunctiva/sclera:  ?   Right eye: Right conjunctiva is not injected. No exudate or hemorrhage. ?   Left eye: Hemorrhage present.  ?   Pupils: Pupils are equal, round, and reactive to light.  ?Neck:  ?   Vascular: No carotid bruit.  ?Cardiovascular:  ?   Rate and Rhythm: Normal rate and regular rhythm.  ?   Pulses: Normal pulses.  ?   Heart sounds: Normal heart sounds. No murmur heard. ?  No friction rub. No gallop.  ?Pulmonary:  ?   Effort: Pulmonary effort is normal. No respiratory distress.  ?   Breath sounds: Normal breath sounds. No wheezing, rhonchi or rales.  ?Chest:  ?   Chest wall: No tenderness.  ?Abdominal:  ?   General: Bowel sounds are normal.  There is no distension.  ?   Palpations: Abdomen is soft. There is no mass.  ?   Tenderness: There is no abdominal tenderness. There is no right CVA tenderness, left CVA tenderness, guarding or rebound.  ?Genitourinary: ?   General: Normal vulva.  ?   Exam position: Lithotomy position.  ?   Labia:     ?   Right: No rash, tenderness or lesion.     ?   Left: No rash, tenderness or lesion.   ?Musculoskeletal:     ?   General: No swelling or tenderness. Normal range of motion.  ?   Cervical back: Normal range of motion. No rigidity or tenderness.  ?   Right lower leg: No edema.  ?   Left lower leg: No edema.  ?Lymphadenopathy:  ?   Cervical: No cervical adenopathy.  ?   Lower Body: No right inguinal adenopathy. No left inguinal adenopathy.  ?Skin: ?   General: Skin is warm and dry.  ?   Coloration: Skin is not pale.  ?   Findings: No erythema or rash.  ?Neurological:  ?   Mental Status: She is alert and oriented to person, place, and time.   ?   Cranial Nerves: No cranial nerve deficit.  ?   Sensory: No sensory deficit.  ?   Motor: No weakness.  ?   Coordination: Coordination normal.  ?   Gait: Gait normal.  ?Psychiatric:     ?   Mood and

## 2021-04-28 ENCOUNTER — Telehealth: Payer: Self-pay | Admitting: Family

## 2021-04-28 DIAGNOSIS — H1132 Conjunctival hemorrhage, left eye: Secondary | ICD-10-CM | POA: Diagnosis not present

## 2021-04-29 ENCOUNTER — Ambulatory Visit (INDEPENDENT_AMBULATORY_CARE_PROVIDER_SITE_OTHER): Payer: BC Managed Care – PPO | Admitting: Family

## 2021-04-29 ENCOUNTER — Encounter: Payer: Self-pay | Admitting: Family

## 2021-04-29 VITALS — BP 140/98 | HR 91 | Temp 97.3°F | Resp 18 | Ht 66.0 in | Wt 217.6 lb

## 2021-04-29 DIAGNOSIS — H1132 Conjunctival hemorrhage, left eye: Secondary | ICD-10-CM | POA: Diagnosis not present

## 2021-04-29 DIAGNOSIS — I10 Essential (primary) hypertension: Secondary | ICD-10-CM | POA: Diagnosis not present

## 2021-04-29 MED ORDER — HYDRALAZINE HCL 50 MG PO TABS: 50.0000 mg | ORAL_TABLET | Freq: Three times a day (TID) | ORAL | 0 refills | Status: DC

## 2021-04-29 NOTE — Progress Notes (Signed)
? ?Provider: Richarda Bladeinah Tavoris Brisk FNP-C ? ?Lamia Mariner, Donalee Citrininah C, NP ? ?Patient Care Team: ?Natoshia Souter, Donalee Citrininah C, NP as PCP - General (Family Medicine) ? ?Extended Emergency Contact Information ?Primary Emergency Contact: [name],[name] ?Mobile Phone: [phone] ?Relation: Son ?Secondary Emergency Contact: [name],[name] ?Mobile Phone: [phone] ?Relation: Relative ? ?Code Status: Full Code  ?Goals of care: Advanced Directive information ? ?  04/29/2021  ? 11:10 AM  ?Advanced Directives  ?Does Patient Have a Medical Advance Directive? No  ?Would patient like information on creating a medical advance directive? No - Patient declined  ? ? ? ?Chief Complaint  ?Patient presents with  ? Follow-up  ?  2 day blood pressure follow up.  ? ? ?HPI:  ?Pt is a 41 y.o. female seen today for an acute visit for 2 weeks for follow up high blood pressure.  She was here on 04/27/2021 for follow-up and control blood pressure after she went to the emergency room on 04/26/2021.  Her blood pressure was 140/120 on last visit.clonidine 0.1 mg was administered with much improvement of her blood pressure.She was started on hydralazine 25 mg 3 times daily.  Blood pressure today has improved 140/98.  States still has occasional slight headaches across her forehead.denies any vision changes,fatigue,chest tightness,palpitation,chest pain or shortness of breath. Has no blood pressure machine to check readings at home.  ? ?She was also referred to ophthalmology for evaluation of left eye subconjunctival Hemorrhage. 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  ? 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  ? ? ? ?Past Medical History:  ?Diagnosis Date  ? Anxiety   ? Bipolar 1 disorder (HCC)   ? PTSD (post-traumatic stress disorder)   ? ?Past Surgical History:  ?Procedure Laterality Date  ? ABDOMINAL HYSTERECTOMY    ? Fibroids/heavy menses  ? ? ?No Known Allergies ? ?Outpatient Encounter Medications as of 04/29/2021  ?Medication Sig  ? albuterol (VENTOLIN HFA) 108 (90 Base) MCG/ACT inhaler Inhale 1-2 puffs into the lungs every 6 (six) hours as needed for wheezing or shortness of breath.  ? amLODipine-valsartan (EXFORGE) 10-320 MG tablet Take 1 tablet by mouth daily.  ? ARIPiprazole (ABILIFY) 10 MG tablet Take 1 tablet (10 mg total) by mouth daily.  ? fluticasone (FLONASE) 50 MCG/ACT nasal spray Place 2 sprays into both nostrils daily.  ? Fluticasone Propionate, Inhal, (FLOVENT DISKUS) 100 MCG/ACT AEPB Inhale 2 puffs by mouth twice daily. Rinse out your mouth after the 2 puffs.  ? hydrOXYzine (VISTARIL) 25 MG capsule Take one capsule by mouth in the morning and two at bed time  ? lamoTRIgine (LAMICTAL) 25 MG tablet Take one tablet by mouth daily for one week and then 2 tab daily  ? [DISCONTINUED] hydrALAZINE (APRESOLINE) 25 MG tablet Take 1 tablet (25 mg total) by mouth 3 (three) times daily.  ? hydrALAZINE (APRESOLINE) 50 MG tablet Take 1 tablet (50 mg total) by mouth 3 (three) times daily.  ? ?No facility-administered encounter medications on file as of 04/29/2021.  ? ? ?Review of Systems  ?Constitutional:  Negative for appetite change, chills, fatigue, fever and unexpected weight  change.  ?HENT:  Negative for congestion, dental problem, ear discharge, ear pain, facial swelling, hearing loss, nosebleeds, postnasal drip, rhinorrhea, sinus pressure, sinus pain, sneezing, sore throat, tinnitus and trouble swallowing.   ?Eyes:  Positive for redness. Negative for pain, discharge, itching and visual disturbance.  ?Respiratory:  Negative for cough, chest tightness, shortness of breath  and wheezing.   ?Cardiovascular:  Negative for chest pain, palpitations and leg swelling.  ?Gastrointestinal:  Negative for abdominal distention, abdominal pain, blood in stool, constipation, diarrhea, nausea and vomiting.  ?Musculoskeletal:  Negative for arthralgias, back pain, gait problem, joint swelling, myalgias, neck pain and neck stiffness.  ?Skin:  Negative for color change, pallor and rash.  ?Neurological:  Negative for dizziness, speech difficulty, weakness, light-headedness, numbness and headaches.  ?Hematological:  Does not bruise/bleed easily.  ?Psychiatric/Behavioral:  Negative for agitation, behavioral problems, confusion and sleep disturbance. The patient is not nervous/anxious.   ?     Increased stress level  ? ?Immunization History  ?Administered Date(s) Administered  ? Influenza, Seasonal, Injecte, Preservative Fre 11/26/2014  ? Influenza,inj,Quad PF,6+ Mos 12/15/2016  ? Tdap 02/02/2021  ? ?Pertinent  Health Maintenance Due  ?Topic Date Due  ? PAP SMEAR-Modifier  06/29/2018  ? INFLUENZA VACCINE  08/16/2021  ? ? ?  10/16/2020  ? 10:42 PM 01/12/2021  ?  3:06 PM 02/20/2021  ?  5:50 PM 04/26/2021  ?  4:32 PM 04/29/2021  ? 11:10 AM  ?Fall Risk  ?Falls in the past year?     0  ?Was there an injury with Fall?     0  ?Fall Risk Category Calculator     0  ?Fall Risk Category     Low  ?Patient Fall Risk Level Low fall risk Low fall risk Low fall risk Low fall risk Low fall risk  ?Patient at Risk for Falls Due to     No Fall Risks  ?Fall risk Follow up     Falls evaluation completed  ? ?Functional Status Survey: ?  ? ?Vitals:  ? 04/29/21 1109  ?BP: (!) 140/98  ?Pulse: 91  ?Resp: 18  ?Temp: (!) 97.3 ?F (36.3 ?C)  ?SpO2: 98%  ?Weight: 217 lb 9.6 oz (98.7 kg)  ?Height: 5\' 6"  (1.676 m)  ? ?Body mass index is 35.12 kg/m? ?Physical Exam ?Vitals reviewed.  ?Constitutional:   ?   General: She is not in acute distress. ?   Appearance: Normal appearance. She is normal weight. She is not ill-appearing or diaphoretic.   ?HENT:  ?   Head: Normocephalic.  ?   Mouth/Throat:  ?   Mouth: Mucous membranes are moist.  ?   Pharynx: Oropharynx is clear. No oropharyngeal exudate or posterior oropharyngeal erythema.  ?Eyes:  ?   General: No scleral icterus.    ?   Right eye: No discharge.     ?   Left eye: No discharge.  ?   Extraocular Movements: Extraocular movements intact.  ?   Conjunctiva/sclera: Conjunctivae normal.  ?   Pupils: Pupils are equal, round, and reactive to light.  ?Neck:  ?   Vascular: No carotid bruit.  ?Cardiovascular:  ?   Rate and Rhythm: Normal rate and regular rhythm.  ?   Pulses: Normal pulses.  ?   Heart sounds: Normal heart sounds. No murmur heard. ?  No friction rub. No gallop.  ?Pulmonary:  ?   Effort: Pulmonary effort is normal. No respiratory distress.  ?   Breath sounds: Normal breath sounds. No  wheezing, rhonchi or rales.  ?Chest:  ?   Chest wall: No tenderness.  ?Abdominal:  ?   General: Bowel sounds are normal. There is no distension.  ?   Palpations: Abdomen is soft. There is no mass.  ?   Tenderness: There is no abdominal tenderness. There is no right CVA tenderness, left CVA tenderness, guarding or rebound.  ?Musculoskeletal:     ?   General: No swelling or tenderness. Normal range of motion.  ?   Cervical back: Normal range of motion. No rigidity or tenderness.  ?   Right lower leg: No edema.  ?   Left lower leg: No edema.  ?Lymphadenopathy:  ?   Cervical: No cervical adenopathy.  ?Skin: ?   General: Skin is warm and dry.  ?   Coloration: Skin is not pale.  ?   Findings: No erythema or rash.  ?Neurological:  ?   Mental Status: She is alert and oriented to person, place, and time.  ?   Cranial Nerves: No cranial nerve deficit.  ?   Sensory: No sensory deficit.  ?   Motor: No weakness.  ?   Gait: Gait normal.  ?Psychiatric:     ?   Mood and Affect: Mood normal.     ?   Speech: Speech normal.     ?   Behavior: Behavior normal.     ?   Thought Content: Thought content normal.     ?   Judgment: Judgment  normal.  ? ? ?Labs reviewed: ?Recent Labs  ?  02/02/21 ?1158 02/20/21 ?1840 04/26/21 ?1718  ?NA 141 138 137  ?K 3.6 3.9 3.6  ?CL 105 102 103  ?CO2 28 28 26   ?GLUCOSE 97 94 141*  ?BUN 8 10 10   ?CREATININE 0.83 0.

## 2021-05-02 ENCOUNTER — Ambulatory Visit: Payer: BC Managed Care – PPO | Admitting: Adult Health

## 2021-05-03 LAB — PAP IG (IMAGE GUIDED)

## 2021-05-16 ENCOUNTER — Other Ambulatory Visit: Payer: Self-pay | Admitting: Family

## 2021-05-16 NOTE — Telephone Encounter (Signed)
Pharmacy requested refill.  ?Last refilled by the ER ?Pended Rx and sent to [facility] for approval.  ?

## 2021-05-18 ENCOUNTER — Telehealth: Payer: Self-pay | Admitting: *Deleted

## 2021-05-18 ENCOUNTER — Encounter: Payer: Self-pay | Admitting: Family

## 2021-05-18 ENCOUNTER — Ambulatory Visit (INDEPENDENT_AMBULATORY_CARE_PROVIDER_SITE_OTHER): Payer: BC Managed Care – PPO | Admitting: Family

## 2021-05-18 VITALS — BP 140/110 | HR 91 | Temp 97.8°F | Ht 66.0 in

## 2021-05-18 DIAGNOSIS — I10 Essential (primary) hypertension: Secondary | ICD-10-CM

## 2021-05-18 DIAGNOSIS — H1132 Conjunctival hemorrhage, left eye: Secondary | ICD-10-CM | POA: Diagnosis not present

## 2021-05-18 MED ORDER — CLONIDINE HCL 0.1 MG PO TABS
0.1000 mg | ORAL_TABLET | Freq: Once | ORAL | Status: AC
  Administered 2021-05-18: 0.1 mg via ORAL

## 2021-05-18 NOTE — Progress Notes (Signed)
? ?Provider: [name] FNP-C ? ?[name], [name], NP ? ?Patient Care Team: ?Deanna Powell, [name], NP as PCP - General (Family Medicine) ? ?Extended Emergency Contact Information ?Primary Emergency Contact: [name],[name] ?Mobile Phone: [phone] ?Relation: Son ?Secondary Emergency Contact: [name],[name] ?Mobile Phone: [phone] ?Relation: Relative ? ?Code Status:  Full code  ?Goals of care: Advanced Directive information ? ?  04/29/2021  ? 11:10 AM  ?Advanced Directives  ?Does Patient Have a Medical Advance Directive? No  ?Would patient like information on creating a medical advance directive? No - Patient declined  ? ? ? ?Chief Complaint  ?Patient presents with  ? Acute Visit  ?  Patient states that she has been feeling lightheadedness. Patient states that she had nose bleed on yesterday. She has been out of work for 2 days. Patient complains of headache. Patient took blood pressure medication and it made her feel "out of it". Patient did not check blood pressure. Patient just recently picked up medication.Patient complains of eye being red.  ? ? ?HPI:  ?Pt is a 41 y.o. female seen today for an acute visit for evaluation of lightheaded ness,headache  x 2 days.Also complains of left eye redness.right eye was previously red but resolved then has recurred.No home blood pressure for review.script written on previous visit for Blood pressure cuff but states pharmacy did not have a blood pressure cuff.she will check with [facility].she took blood pressure medication which made her feel " out of it".  ?Had nose bleed yesterday. Has not been taking her Amlodipine -valsartan since she  ?B/p today elevated 140/110 ?She request excuse for work states could not go to work on 05/16/2021 and 05/17/2021 due to high blood pressure not feeling well. ? ? ?Past Medical History:  ?Diagnosis Date  ? Anxiety   ? Bipolar 1 disorder (HCC)   ? PTSD (post-traumatic stress disorder)   ? ?Past Surgical History:  ?Procedure  Laterality Date  ? ABDOMINAL HYSTERECTOMY    ? Fibroids/heavy menses  ? ? ?No Known Allergies ? ?Outpatient Encounter Medications as of 05/18/2021  ?Medication Sig  ? albuterol (VENTOLIN HFA) 108 (90 Base) MCG/ACT inhaler INHALE 1-2 PUFFS BY MOUTH EVERY 6 HOURS AS NEEDED FOR WHEEZE OR SHORTNESS OF BREATH  ? amLODipine-valsartan (EXFORGE) 10-320 MG tablet Take 1 tablet by mouth daily.  ? ARIPiprazole (ABILIFY) 10 MG tablet Take 1 tablet (10 mg total) by mouth daily.  ? fluticasone (FLONASE) 50 MCG/ACT nasal spray Place 2 sprays into both nostrils daily.  ? Fluticasone Propionate, Inhal, (FLOVENT DISKUS) 100 MCG/ACT AEPB Inhale 2 puffs by mouth twice daily. Rinse out your mouth after the 2 puffs.  ? hydrALAZINE (APRESOLINE) 50 MG tablet Take 1 tablet (50 mg total) by mouth 3 (three) times daily.  ? hydrOXYzine (VISTARIL) 25 MG capsule Take one capsule by mouth in the morning and two at bed time  ? lamoTRIgine (LAMICTAL) 25 MG tablet Take one tablet by mouth daily for one week and then 2 tab daily  ? ?No facility-administered encounter medications on file as of 05/18/2021.  ? ? ?Review of Systems  ?Constitutional:  Negative for appetite change, chills, fatigue, fever and unexpected weight change.  ?HENT:  Negative for congestion, dental problem, ear discharge, ear pain, facial swelling, hearing loss, nosebleeds, postnasal drip, rhinorrhea, sinus pressure, sinus pain, sneezing, sore throat, tinnitus and trouble swallowing.   ?Eyes:  Negative for pain, discharge, redness, itching and visual disturbance.  ?Respiratory:  Negative for cough, chest tightness, shortness of breath and wheezing.   ?  Cardiovascular:  Negative for chest pain, palpitations and leg swelling.  ?Gastrointestinal:  Negative for abdominal distention, abdominal pain, blood in stool, constipation, diarrhea, nausea and vomiting.  ?Endocrine: Negative for cold intolerance, heat intolerance, polydipsia, polyphagia and polyuria.  ?Genitourinary:  Negative for  difficulty urinating, dysuria, flank pain, frequency and urgency.  ?Musculoskeletal:  Negative for arthralgias, back pain, gait problem, joint swelling, myalgias, neck pain and neck stiffness.  ?Skin:  Negative for color change, pallor, rash and wound.  ?Neurological:  Negative for dizziness, syncope, speech difficulty, weakness, light-headedness, numbness and headaches.  ?Hematological:  Does not bruise/bleed easily.  ?Psychiatric/Behavioral:  Negative for agitation, behavioral problems, confusion, hallucinations, self-injury, sleep disturbance and suicidal ideas. The patient is not nervous/anxious.   ? ?Immunization History  ?Administered Date(s) Administered  ? Influenza, Seasonal, Injecte, Preservative Fre 11/26/2014  ? Influenza,inj,Quad PF,6+ Mos 12/15/2016  ? Tdap 02/02/2021  ? ?Pertinent  Health Maintenance Due  ?Topic Date Due  ? PAP SMEAR-Modifier  06/29/2018  ? INFLUENZA VACCINE  08/16/2021  ? ? ?  10/16/2020  ? 10:42 PM 01/12/2021  ?  3:06 PM 02/20/2021  ?  5:50 PM 04/26/2021  ?  4:32 PM 04/29/2021  ? 11:10 AM  ?Fall Risk  ?Falls in the past year?     0  ?Was there an injury with Fall?     0  ?Fall Risk Category Calculator     0  ?Fall Risk Category     Low  ?Patient Fall Risk Level Low fall risk Low fall risk Low fall risk Low fall risk Low fall risk  ?Patient at Risk for Falls Due to     No Fall Risks  ?Fall risk Follow up     Falls evaluation completed  ? ?Functional Status Survey: ?  ? ?Vitals:  ? 05/18/21 0854  ?BP: (!) 140/110  ?Pulse: 91  ?Temp: 97.8 ?F (36.6 ?C)  ?SpO2: 98%  ?Height: 5\' 6"  (1.676 m)  ? ?Body mass index is 35.12 kg/m?Marland Kitchen. ?Physical Exam ?Vitals reviewed.  ?Constitutional:   ?   General: She is not in acute distress. ?   Appearance: Normal appearance. She is normal weight. She is not ill-appearing or diaphoretic.  ?HENT:  ?   Head: Normocephalic.  ?   Nose: Nose normal. No congestion or rhinorrhea.  ?   Mouth/Throat:  ?   Mouth: Mucous membranes are moist.  ?   Pharynx: Oropharynx is  clear. No oropharyngeal exudate or posterior oropharyngeal erythema.  ?Eyes:  ?   General: No scleral icterus.    ?   Right eye: No discharge.     ?   Left eye: No discharge.  ?   Extraocular Movements: Extraocular movements intact.  ?   Conjunctiva/sclera:  ?   Right eye: Right conjunctiva is not injected. No exudate or hemorrhage. ?   Left eye: Left conjunctiva is injected. No exudate or hemorrhage. ?   Pupils: Pupils are equal, round, and reactive to light.  ?Neck:  ?   Vascular: No carotid bruit.  ?Cardiovascular:  ?   Rate and Rhythm: Normal rate and regular rhythm.  ?   Pulses: Normal pulses.  ?   Heart sounds: Normal heart sounds. No murmur heard. ?  No friction rub. No gallop.  ?Pulmonary:  ?   Effort: Pulmonary effort is normal. No respiratory distress.  ?   Breath sounds: Normal breath sounds. No wheezing, rhonchi or rales.  ?Chest:  ?   Chest wall: No tenderness.  ?Abdominal:  ?  General: Bowel sounds are normal. There is no distension.  ?   Palpations: Abdomen is soft. There is no mass.  ?   Tenderness: There is no abdominal tenderness. There is no right CVA tenderness, left CVA tenderness, guarding or rebound.  ?Musculoskeletal:     ?   General: No swelling or tenderness. Normal range of motion.  ?   Cervical back: Normal range of motion. No rigidity or tenderness.  ?   Right lower leg: No edema.  ?   Left lower leg: No edema.  ?Lymphadenopathy:  ?   Cervical: No cervical adenopathy.  ?Skin: ?   General: Skin is warm and dry.  ?   Coloration: Skin is not pale.  ?   Findings: No erythema or rash.  ?Neurological:  ?   Mental Status: She is alert and oriented to person, place, and time.  ?   Cranial Nerves: No cranial nerve deficit.  ?   Sensory: No sensory deficit.  ?   Motor: No weakness.  ?   Coordination: Coordination normal.  ?   Gait: Gait normal.  ?Psychiatric:     ?   Mood and Affect: Mood normal.     ?   Speech: Speech normal.     ?   Behavior: Behavior normal.  ? ? ?Labs reviewed: ?Recent Labs   ?  02/02/21 ?1158 02/20/21 ?1840 04/26/21 ?1718  ?NA 141 138 137  ?K 3.6 3.9 3.6  ?CL 105 102 103  ?CO2 28 28 26   ?GLUCOSE 97 94 141*  ?BUN 8 10 10   ?CREATININE 0.83 0.76 0.92  ?CALCIUM 8.7 9.0 9.3  ? ?Recent Labs  ?

## 2021-05-18 NOTE — Telephone Encounter (Signed)
Received Short Term Disability Paperwork from [employer].  ?Patient is requesting short term disability benefits.  ? ?Placed in Dinah's folder to review, fill out and sign.  ?To be faxed back to [employer] Fax: [phone] once completed.  ?

## 2021-05-18 NOTE — Patient Instructions (Signed)
Take blood pressure medication as directed - Hydralazine and Amlodipine- Valsartan then follow up for recheck. ?

## 2021-05-18 NOTE — Telephone Encounter (Signed)
FMLA Paperwork faxed from [employer] requesting to be filled out.  ?Employer: [employer] ? ?Placed paperwork in Dinah's folder to review and sign.  ?To be faxed back to [employer] Fax: [phone] once completed.  ?

## 2021-05-23 ENCOUNTER — Encounter: Payer: Self-pay | Admitting: Family

## 2021-05-23 ENCOUNTER — Ambulatory Visit: Payer: BC Managed Care – PPO | Admitting: Family

## 2021-05-23 ENCOUNTER — Ambulatory Visit (INDEPENDENT_AMBULATORY_CARE_PROVIDER_SITE_OTHER): Payer: BC Managed Care – PPO | Admitting: Family

## 2021-05-23 VITALS — BP 150/98 | HR 74 | Temp 97.6°F | Resp 22 | Ht 66.0 in

## 2021-05-23 DIAGNOSIS — I10 Essential (primary) hypertension: Secondary | ICD-10-CM | POA: Diagnosis not present

## 2021-05-23 DIAGNOSIS — K5901 Slow transit constipation: Secondary | ICD-10-CM | POA: Diagnosis not present

## 2021-05-23 MED ORDER — SENNOSIDES-DOCUSATE SODIUM 8.6-50 MG PO TABS: 1.0000 | ORAL_TABLET | Freq: Every day | ORAL | 3 refills | Status: DC

## 2021-05-23 NOTE — Patient Instructions (Signed)

## 2021-05-23 NOTE — Progress Notes (Signed)
Provider: [name] FNP-C  Jerime Arif, [name], NP  Patient Care Team: Raiden Yearwood, [name], NP as PCP - General (Family Medicine)  Extended Emergency Contact Information Primary Emergency Contact: [facility] Phone: [phone] Relation: Son Secondary Emergency Contact: [name],[name] Mobile Phone: [phone] Relation: Relative  Code Status:  Full Code  Goals of care: Advanced Directive information    05/23/2021   10:57 AM  Advanced Directives  Does Patient Have a Medical Advance Directive? No  Would patient like information on creating a medical advance directive? Yes (ED - Information included in AVS)     Chief Complaint  Patient presents with   Follow-up    Patient is here for a follow up for elevated blood pressure     HPI:  Pt is a 41 y.o. female seen today for an acute visit for follow up high blood pressure.she was here on 05/18/2021 for high blood 140/110 had not taken her amlodipine-valsartan since she was started on hydralazine 25 mg 3 times daily.She was advised to restart her amlodipine-valsartan and continue with hydralazine.Had left eye subconjunctival hemorrhage has improved.She was seen by ophthalmologist previously and was advised to continue to monitor. She continues to report increased stress level at her workplace and also having to leave in a motel due to housing problems that she has.  She has been advised to schedule appointment with her therapist to discuss coping mechanism for her stress level.Also recommended social services to assist with her living situation.  She denies any headache,dizziness,vision changes,fatigue,chest tightness,palpitation,chest pain or shortness of breath.     Request intermittent leave for B/p.  Also requests paperwork filled for FMLA for absences she encountered when she had to go to the emergency room and several other visits she had to come over here for appointments due to uncontrolled blood pressure.  Past  Medical History:  Diagnosis Date   Anxiety    Bipolar 1 disorder (HCC)    PTSD (post-traumatic stress disorder)    Past Surgical History:  Procedure Laterality Date   ABDOMINAL HYSTERECTOMY     Fibroids/heavy menses    No Known Allergies  Outpatient Encounter Medications as of 05/23/2021  Medication Sig   albuterol (VENTOLIN HFA) 108 (90 Base) MCG/ACT inhaler INHALE 1-2 PUFFS BY MOUTH EVERY 6 HOURS AS NEEDED FOR WHEEZE OR SHORTNESS OF BREATH   amLODipine-valsartan (EXFORGE) 10-320 MG tablet Take 1 tablet by mouth daily.   ARIPiprazole (ABILIFY) 10 MG tablet Take 1 tablet (10 mg total) by mouth daily.   fluticasone (FLONASE) 50 MCG/ACT nasal spray Place 2 sprays into both nostrils daily.   Fluticasone Propionate, Inhal, (FLOVENT DISKUS) 100 MCG/ACT AEPB Inhale 2 puffs by mouth twice daily. Rinse out your mouth after the 2 puffs.   hydrALAZINE (APRESOLINE) 50 MG tablet Take 1 tablet (50 mg total) by mouth 3 (three) times daily.   hydrOXYzine (VISTARIL) 25 MG capsule Take one capsule by mouth in the morning and two at bed time   lamoTRIgine (LAMICTAL) 25 MG tablet Take one tablet by mouth daily for one week and then 2 tab daily   No facility-administered encounter medications on file as of 05/23/2021.    Review of Systems  Constitutional:  Negative for appetite change, chills, fatigue, fever and unexpected weight change.  HENT:  Negative for congestion, dental problem, ear discharge, ear pain, facial swelling, hearing loss, nosebleeds, postnasal drip, rhinorrhea, sinus pressure, sinus pain, sneezing, sore throat, tinnitus and trouble swallowing.   Eyes:  Negative for pain, discharge, redness, itching  and visual disturbance.  Respiratory:  Negative for cough, chest tightness, shortness of breath and wheezing.   Cardiovascular:  Negative for chest pain, palpitations and leg swelling.  Gastrointestinal:  Negative for abdominal distention, abdominal pain, blood in stool, constipation,  diarrhea, nausea and vomiting.  Genitourinary:  Negative for difficulty urinating, dysuria, flank pain, frequency and urgency.  Musculoskeletal:  Negative for arthralgias, back pain, gait problem, joint swelling, myalgias, neck pain and neck stiffness.  Skin:  Negative for color change, pallor, rash and wound.  Neurological:  Negative for dizziness, syncope, speech difficulty, weakness, light-headedness, numbness and headaches.  Hematological:  Does not bruise/bleed easily.  Psychiatric/Behavioral:  Negative for agitation, behavioral problems, confusion, hallucinations, self-injury, sleep disturbance and suicidal ideas. The patient is nervous/anxious.    Immunization History  Administered Date(s) Administered   Influenza, Seasonal, Injecte, Preservative Fre 11/26/2014   Influenza,inj,Quad PF,6+ Mos 12/15/2016   Tdap 02/02/2021   Pertinent  Health Maintenance Due  Topic Date Due   PAP SMEAR-Modifier  06/29/2018   INFLUENZA VACCINE  08/16/2021      01/12/2021    3:06 PM 02/20/2021    5:50 PM 04/26/2021    4:32 PM 04/29/2021   11:10 AM 05/23/2021    2:25 PM  Fall Risk  Falls in the past year?    0 0  Was there an injury with Fall?    0 0  Fall Risk Category Calculator    0 0  Fall Risk Category    Low Low  Patient Fall Risk Level Low fall risk Low fall risk Low fall risk Low fall risk Low fall risk  Patient at Risk for Falls Due to    No Fall Risks No Fall Risks  Fall risk Follow up    Falls evaluation completed Falls evaluation completed   Functional Status Survey:    Vitals:   05/23/21 1424  BP: (!) 150/98  Pulse: 74  Resp: (!) 22  Temp: 97.6 F (36.4 C)  SpO2: 96%  Height: 5\' 6"  (1.676 m)   Body mass index is 35.12 kg/m. Physical Exam Vitals reviewed.  Constitutional:      General: She is not in acute distress.    Appearance: Normal appearance. She is normal weight. She is not ill-appearing or diaphoretic.  HENT:     Head: Normocephalic.     Right Ear: Tympanic  membrane, ear canal and external ear normal. There is no impacted cerumen.     Left Ear: Tympanic membrane, ear canal and external ear normal. There is no impacted cerumen.     Nose: Nose normal. No congestion or rhinorrhea.     Mouth/Throat:     Mouth: Mucous membranes are moist.     Pharynx: Oropharynx is clear. No oropharyngeal exudate or posterior oropharyngeal erythema.  Eyes:     General: No scleral icterus.       Right eye: No discharge.        Left eye: No discharge.     Extraocular Movements: Extraocular movements intact.     Conjunctiva/sclera: Conjunctivae normal.     Pupils: Pupils are equal, round, and reactive to light.  Neck:     Vascular: No carotid bruit.  Cardiovascular:     Rate and Rhythm: Normal rate and regular rhythm.     Pulses: Normal pulses.     Heart sounds: Normal heart sounds. No murmur heard.   No friction rub. No gallop.  Pulmonary:     Effort: Pulmonary effort is normal. No respiratory  distress.     Breath sounds: Normal breath sounds. No wheezing, rhonchi or rales.  Chest:     Chest wall: No tenderness.  Abdominal:     General: Bowel sounds are normal. There is no distension.     Palpations: Abdomen is soft. There is no mass.     Tenderness: There is no abdominal tenderness. There is no right CVA tenderness, left CVA tenderness, guarding or rebound.  Musculoskeletal:        General: No swelling or tenderness. Normal range of motion.     Cervical back: Normal range of motion. No rigidity or tenderness.     Right lower leg: No edema.     Left lower leg: No edema.  Lymphadenopathy:     Cervical: No cervical adenopathy.  Skin:    General: Skin is warm and dry.     Coloration: Skin is not pale.     Findings: No bruising, erythema, lesion or rash.  Neurological:     Mental Status: She is alert and oriented to person, place, and time.     Cranial Nerves: No cranial nerve deficit.     Sensory: No sensory deficit.     Motor: No weakness.      Coordination: Coordination normal.     Gait: Gait normal.  Psychiatric:        Mood and Affect: Mood normal.        Speech: Speech normal.        Behavior: Behavior normal.        Thought Content: Thought content normal.        Judgment: Judgment normal.    Labs reviewed: Recent Labs    02/02/21 1158 02/20/21 1840 04/26/21 1718  NA 141 138 137  K 3.6 3.9 3.6  CL 105 102 103  CO2 28 28 26   GLUCOSE 97 94 141*  BUN 8 10 10   CREATININE 0.83 0.76 0.92  CALCIUM 8.7 9.0 9.3   Recent Labs    02/02/21 1158  AST 15  ALT 14  ALKPHOS 56  BILITOT 0.3  PROT 6.9  ALBUMIN 4.1   Recent Labs    02/02/21 1158 02/20/21 1840 04/26/21 1718  WBC 5.6 6.0 6.6  NEUTROABS  --  3.7 4.4  HGB 11.3* 11.4* 12.5  HCT 36.1 38.3 40.5  MCV 72.6* 75.0* 73.6*  PLT 264.0 267 319   No results found for: TSH No results found for: HGBA1C Lab Results  Component Value Date   CHOL 163 02/02/2021   HDL 38.40 (L) 02/02/2021   LDLCALC 109 (H) 02/02/2021   TRIG 78.0 02/02/2021   CHOLHDL 4 02/02/2021    Significant Diagnostic Results in last 30 days:  DG Chest 2 View  Result Date: 04/26/2021 CLINICAL DATA:  Shortness of breath.  Dizziness.  Headache. EXAM: CHEST - 2 VIEW COMPARISON:  Chest two views 01/12/2021 FINDINGS: Cardiac silhouette and mediastinal contours are within normal limits. There are again moderately decreased lung volumes. No pleural effusion or pneumothorax. No acute skeletal abnormality. IMPRESSION: No active cardiopulmonary disease. Electronically Signed   By: Neita Garnet M.D.   On: 04/26/2021 20:59   CT Head Wo Contrast  Result Date: 04/26/2021 CLINICAL DATA:  Headache, new or worsening, neuro deficit (Age 65-49y) EXAM: CT HEAD WITHOUT CONTRAST TECHNIQUE: Contiguous axial images were obtained from the base of the skull through the vertex without intravenous contrast. RADIATION DOSE REDUCTION: This exam was performed according to the departmental dose-optimization program which  includes automated exposure control,  adjustment of the mA and/or kV according to patient size and/or use of iterative reconstruction technique. COMPARISON:  None. FINDINGS: Brain: No intracranial hemorrhage, mass effect, or midline shift. No hydrocephalus. The basilar cisterns are patent. No evidence of territorial infarct or acute ischemia. No extra-axial or intracranial fluid collection. Vascular: No hyperdense vessel or unexpected calcification. Skull: No fracture or focal lesion. Sinuses/Orbits: Lobulated mucosal thickening of both maxillary sinuses without fluid level. Unremarkable orbits. No mastoid effusion. Other: None. IMPRESSION: 1. No acute intracranial abnormality. 2. Bilateral maxillary sinus mucosal thickening. Electronically Signed   By: Narda Rutherford M.D.   On: 04/26/2021 20:58    Assessment/Plan 1. Uncontrolled hypertension Blood pressure still not at goal but has improved compared to previous visits. We will increase hydralazine from 25 mg 3 times daily to 50 mg tablet 3 times daily. -Continue on amlodipine-valsartan 10-320 mg tablet daily. - Advised to check Blood pressure at home and record on log provided and notify provider if B/p > 140/90   2. Slow transit constipation - encouraged to increase fiber in diet  - increase water intake to 6-8 glasses of water daily and exercise as tolerated  - continue on miralax  Will add Senokot as below.  Advised to hold if she has loose stools. - senna-docusate (SENOKOT-S) 8.6-50 MG tablet; Take 1 tablet by mouth at bedtime.  Dispense: 30 tablet; Refill: 3  Family/ staff Communication: Reviewed plan of care with patient verbalized understanding  Labs/tests ordered: None   Next Appointment: As needed if symptoms worsen or fail to improve  Caesar Bookman, NP

## 2021-05-25 NOTE — Telephone Encounter (Signed)
Paper work completed placed on [employer] to be faxed. ?

## 2021-05-25 NOTE — Telephone Encounter (Signed)
FMLA paper work completed placed on Deanna Powell's desk to be faxed. ?

## 2021-05-26 NOTE — Telephone Encounter (Signed)
Received Paperwork and Faxed to [employer] Fax: [phone] ?Copy Sent to [place].  ?

## 2021-05-26 NOTE — Telephone Encounter (Signed)
Paperwork Received and Faxed to [employer] Fax: [phone] ?Copy sent to [place].  ?

## 2021-05-30 ENCOUNTER — Ambulatory Visit: Payer: BC Managed Care – PPO | Admitting: Family

## 2021-06-12 ENCOUNTER — Other Ambulatory Visit: Payer: Self-pay | Admitting: Family Medicine

## 2021-06-16 ENCOUNTER — Ambulatory Visit: Payer: BC Managed Care – PPO

## 2021-06-16 DIAGNOSIS — G4733 Obstructive sleep apnea (adult) (pediatric): Secondary | ICD-10-CM

## 2021-06-16 DIAGNOSIS — G4719 Other hypersomnia: Secondary | ICD-10-CM

## 2021-06-25 ENCOUNTER — Telehealth: Payer: Self-pay | Admitting: Pulmonary Disease

## 2021-06-25 DIAGNOSIS — G4733 Obstructive sleep apnea (adult) (pediatric): Secondary | ICD-10-CM | POA: Diagnosis not present

## 2021-06-25 NOTE — Telephone Encounter (Signed)
Call patient  Sleep study result  Date of study: 06/17/2021  Impression: Mild obstructive sleep apnea Mild oxygen desaturations  Recommendation: Options of treatment for mild obstructive sleep apnea will include  1.  CPAP therapy if there is significant daytime sleepiness or other comorbidities including history of CVA or cardiac disease  -If CPAP is chosen as an option of treatment auto titrating CPAP with a pressure setting of 5-15 will be appropriate  2.  Watchful waiting with emphasis on weight loss measures, sleep position modification to optimize lateral sleep, elevating the head of the bed by about 30 degrees may also help.  3.  An oral device may be fashioned for the treatment of mild sleep disordered breathing, will involve referral to dentist.   Follow-up as previously scheduled

## 2021-06-27 ENCOUNTER — Telehealth: Payer: Self-pay | Admitting: Adult Health

## 2021-06-27 NOTE — Telephone Encounter (Signed)
-----   Message from Donne Hazel sent at 06/27/2021  4:59 PM EDT ----- Hello this patient's HST report is ready for review

## 2021-06-27 NOTE — Telephone Encounter (Signed)
ATC x1.  LVM to return call regarding sleep study test results and to schedule an OV.  When she returns call, please schedule for OV to discuss sleep study test results.

## 2021-06-27 NOTE — Telephone Encounter (Signed)
Please set up ov to discuss sleep study results

## 2021-06-27 NOTE — Telephone Encounter (Signed)
Home sleep study completed on June 17, 2021 shows mild sleep apnea with a AHI at 13.9/hour and SPO2 low at 80%  Please set up office visit to review sleep study results and treatment plan

## 2021-07-01 NOTE — Telephone Encounter (Signed)
Called and spoke with patient, scheduled mychart video visit for Thursday June 22 nd at 9 am, advised to arrive by 8:45 am.  She verbalized understanding.  Nothing further needed.

## 2021-07-07 ENCOUNTER — Encounter: Payer: Self-pay | Admitting: Adult Health

## 2021-07-07 ENCOUNTER — Telehealth (INDEPENDENT_AMBULATORY_CARE_PROVIDER_SITE_OTHER): Payer: BC Managed Care – PPO | Admitting: Adult Health

## 2021-07-07 DIAGNOSIS — G4719 Other hypersomnia: Secondary | ICD-10-CM | POA: Diagnosis not present

## 2021-07-07 NOTE — Progress Notes (Signed)
Virtual Visit via Video Note  I connected with Deanna Powell on 07/07/21 at  9:00 AM EDT by a video enabled telemedicine application and verified that I am speaking with the correct person using two identifiers.  Location: Patient: Home  Provider: Office    I discussed the limitations of evaluation and management by telemedicine and the availability of in person appointments. The patient expressed understanding and agreed to proceed.  History of Present Illness: 41 year old female seen for sleep consult March 17, 2021 with snoring and daytime sleepiness.  Found to have a minimum mild sleep apnea Medical history significant for hypertension, bipolar disorder, PTSD, anxiety  Today's video visit is a 59-month follow-up.  Patient was seen last visit for sleep consult.  She had snoring daytime sleepiness and restless sleep.  Patient was set up for home sleep study that was completed June 17, 2021 that showed mild sleep apnea with AHI at 13.9/hour and SPO2 low at 80%.  We discussed her sleep study results.  Went over treatment options including weight loss, oral appliance and CPAP.  Patient like to proceed with CPAP therapy.  Patient education was given on CPAP care.  She is working on weight loss. Getting ready to go to healthy weight and wellness.   Past Medical History:  Diagnosis Date   Anxiety    Bipolar 1 disorder (HCC)    PTSD (post-traumatic stress disorder)    Current Outpatient Medications on File Prior to Visit  Medication Sig Dispense Refill   albuterol (VENTOLIN HFA) 108 (90 Base) MCG/ACT inhaler INHALE 1-2 PUFFS BY MOUTH EVERY 6 HOURS AS NEEDED FOR WHEEZE OR SHORTNESS OF BREATH 18 each 3   amLODipine-valsartan (EXFORGE) 10-320 MG tablet Take 1 tablet by mouth daily. 30 tablet 2   ARIPiprazole (ABILIFY) 10 MG tablet Take 1 tablet (10 mg total) by mouth daily. 30 tablet 1   fluticasone (FLONASE) 50 MCG/ACT nasal spray Place 2 sprays into both nostrils daily. 11.1 mL 2   Fluticasone  Propionate, Inhal, (FLOVENT DISKUS) 100 MCG/ACT AEPB Inhale 2 puffs by mouth twice daily. Rinse out your mouth after the 2 puffs. 60 each 0   hydrALAZINE (APRESOLINE) 50 MG tablet Take 1 tablet (50 mg total) by mouth 3 (three) times daily. 270 tablet 0   hydrOXYzine (VISTARIL) 25 MG capsule Take one capsule by mouth in the morning and two at bed time 90 capsule 1   lamoTRIgine (LAMICTAL) 25 MG tablet Take one tablet by mouth daily for one week and then 2 tab daily 60 tablet 1   senna-docusate (SENOKOT-S) 8.6-50 MG tablet Take 1 tablet by mouth at bedtime. 30 tablet 3   No current facility-administered medications on file prior to visit.        Observations/Objective: Appears in NAD   Home sleep study completed on June 17, 2021 shows mild sleep apnea with a AHI at 13.9/hour and SPO2 low at 80%  Assessment and Plan: Mild obstructive sleep apnea with nocturnal desaturations.-Discussed sleep study results.  Patient education was given. We will begin CPAP AutoSet 5 to 15 cm H2O.  - discussed how weight can impact sleep and risk for sleep disordered breathing - discussed options to assist with weight loss: combination of diet modification, cardiovascular and strength training exercises   - had an extensive discussion regarding the adverse health consequences related to untreated sleep disordered breathing - specifically discussed the risks for hypertension, coronary artery disease, cardiac dysrhythmias, cerebrovascular disease, and diabetes - lifestyle modification discussed   -  discussed how sleep disruption can increase risk of accidents, particularly when driving - safe driving practices were discussed    Plan  Patient Instructions  Begin CPAP at bedtime and with naps Goal is to wear your CPAP all night long for at least 6 or more hours Do not drive if sleepy Work on healthy weight Healthy sleep regimen Follow-up in 3 months and as needed    Follow Up Instructions:    I  discussed the assessment and treatment plan with the patient. The patient was provided an opportunity to ask questions and all were answered. The patient agreed with the plan and demonstrated an understanding of the instructions.   The patient was advised to call back or seek an in-person evaluation if the symptoms worsen or if the condition fails to improve as anticipated.  I provided 22  minutes of non-face-to-face time during this encounter.   Rubye Oaks, NP

## 2021-07-07 NOTE — Patient Instructions (Signed)
Begin CPAP at bedtime and with naps Goal is to wear your CPAP all night long for at least 6 or more hours Do not drive if sleepy Work on healthy weight Healthy sleep regimen Follow-up in 3 months and as needed

## 2021-08-10 ENCOUNTER — Telehealth: Payer: Self-pay | Admitting: Adult Health

## 2021-08-10 ENCOUNTER — Other Ambulatory Visit: Payer: Self-pay | Admitting: Family Medicine

## 2021-08-10 DIAGNOSIS — I10 Essential (primary) hypertension: Secondary | ICD-10-CM

## 2021-08-11 ENCOUNTER — Other Ambulatory Visit: Payer: Self-pay | Admitting: Family

## 2021-08-11 ENCOUNTER — Other Ambulatory Visit: Payer: Self-pay | Admitting: Family Medicine

## 2021-08-11 DIAGNOSIS — I10 Essential (primary) hypertension: Secondary | ICD-10-CM

## 2021-08-11 NOTE — Telephone Encounter (Signed)
Patient has request refill on medication Hydralazine 50mg . Patient medication has end date. Medication pend and sent to PCP Ngetich, , NP for  approval.

## 2021-08-11 NOTE — Telephone Encounter (Signed)
ATC patient but voicemail box is not set up  DME: [employer] Number [phone]

## 2021-08-12 NOTE — Telephone Encounter (Signed)
ATC patient. Unable to leave vm due to it not being set up. Will try to call back again.

## 2021-08-15 NOTE — Telephone Encounter (Signed)
Letter sent. Closing encounter.  

## 2021-08-23 ENCOUNTER — Ambulatory Visit (INDEPENDENT_AMBULATORY_CARE_PROVIDER_SITE_OTHER): Payer: BC Managed Care – PPO | Admitting: Family

## 2021-08-23 ENCOUNTER — Encounter: Payer: Self-pay | Admitting: Family

## 2021-08-23 VITALS — BP 130/98 | HR 80 | Temp 97.4°F | Resp 16 | Ht 66.0 in | Wt 213.8 lb

## 2021-08-23 DIAGNOSIS — R300 Dysuria: Secondary | ICD-10-CM

## 2021-08-23 DIAGNOSIS — Z202 Contact with and (suspected) exposure to infections with a predominantly sexual mode of transmission: Secondary | ICD-10-CM | POA: Diagnosis not present

## 2021-08-23 DIAGNOSIS — L989 Disorder of the skin and subcutaneous tissue, unspecified: Secondary | ICD-10-CM

## 2021-08-23 DIAGNOSIS — Z113 Encounter for screening for infections with a predominantly sexual mode of transmission: Secondary | ICD-10-CM | POA: Diagnosis not present

## 2021-08-23 DIAGNOSIS — R519 Headache, unspecified: Secondary | ICD-10-CM

## 2021-08-23 LAB — POCT URINALYSIS DIPSTICK
Bilirubin, UA: NEGATIVE
Glucose, UA: NEGATIVE
Ketones, UA: POSITIVE
Nitrite, UA: POSITIVE
Protein, UA: NEGATIVE
Spec Grav, UA: 1.02 (ref 1.010–1.025)
Urobilinogen, UA: NEGATIVE E.U./dL — AB
pH, UA: 5 (ref 5.0–8.0)

## 2021-08-23 NOTE — Progress Notes (Addendum)
Provider: [name] FNP-C  Shy Guallpa, [name], NP  Patient Care Team: Jessicaann Overbaugh, [name], NP as PCP - General (Family Medicine)  Extended Emergency Contact Information Primary Emergency Contact: [facility] Phone: [phone] Relation: Son Secondary Emergency Contact: [name],[name] Mobile Phone: [phone] Relation: Relative  Code Status:  Full Code  Goals of care: Advanced Directive information    08/23/2021   10:46 AM  Advanced Directives  Does Patient Have a Medical Advance Directive? No  Would patient like information on creating a medical advance directive? No - Patient declined     Chief Complaint  Patient presents with   Acute Visit    Patient complains of headaches on/off. Patient states this has been ongoing for a couple of weeks.     HPI:  Pt is a 41 y.o. female seen today for an acute visit for evaluation of headache on and off x 2 weeks.Headache described as generalized.Has had some nausea but no vomiting.No sensitivity with light.  Has had strong vaginal odor with discharge.Has one sexual partner but thinks seeing other people.  Also concerned over rash on upper eyelid ongoing for several months.Not itchy and without any drainage.   Also request renewal for FMLA paperwork.she will have her work place send paperwork  Request work excuse letter for being late to work on Friday.Provider advised her no work letter written for days when she was not seen here at the office but can write excuse for today since she was seen.   Past Medical History:  Diagnosis Date   Anxiety    Bipolar 1 disorder (HCC)    PTSD (post-traumatic stress disorder)    Past Surgical History:  Procedure Laterality Date   ABDOMINAL HYSTERECTOMY     Fibroids/heavy menses    No Known Allergies  Outpatient Encounter Medications as of 08/23/2021  Medication Sig   albuterol (VENTOLIN HFA) 108 (90 Base) MCG/ACT inhaler INHALE 1-2 PUFFS BY MOUTH EVERY 6 HOURS AS NEEDED  FOR WHEEZE OR SHORTNESS OF BREATH   amLODipine-valsartan (EXFORGE) 10-320 MG tablet Take 1 tablet by mouth daily.   ARIPiprazole (ABILIFY) 10 MG tablet Take 1 tablet (10 mg total) by mouth daily.   fluticasone (FLONASE) 50 MCG/ACT nasal spray Place 2 sprays into both nostrils daily.   hydrALAZINE (APRESOLINE) 50 MG tablet TAKE 1 TABLET BY MOUTH THREE TIMES A DAY   hydrOXYzine (VISTARIL) 25 MG capsule Take one capsule by mouth in the morning and two at bed time   lamoTRIgine (LAMICTAL) 25 MG tablet Take one tablet by mouth daily for one week and then 2 tab daily   senna-docusate (SENOKOT-S) 8.6-50 MG tablet Take 1 tablet by mouth at bedtime.   Fluticasone Propionate, Inhal, (FLOVENT DISKUS) 100 MCG/ACT AEPB Inhale 2 puffs by mouth twice daily. Rinse out your mouth after the 2 puffs. (Patient not taking: Reported on 08/23/2021)   No facility-administered encounter medications on file as of 08/23/2021.    Review of Systems  Constitutional:  Negative for chills, fatigue and fever.  Eyes:  Negative for pain, discharge, redness, itching and visual disturbance.  Gastrointestinal:  Negative for abdominal distention, abdominal pain, nausea and vomiting.  Genitourinary:  Positive for vaginal discharge. Negative for difficulty urinating, dysuria, flank pain, frequency, hematuria, urgency, vaginal bleeding and vaginal pain.  Skin:  Negative for color change, pallor and rash.       Rash over eyelid   Neurological:  Negative for dizziness, weakness, light-headedness and numbness.       Intermittent headache  Immunization History  Administered Date(s) Administered   Influenza, Seasonal, Injecte, Preservative Fre 11/26/2014   Influenza,inj,Quad PF,6+ Mos 11/26/2014, 12/15/2016   Tdap 02/02/2021   Pertinent  Health Maintenance Due  Topic Date Due   PAP SMEAR-Modifier  06/29/2018   INFLUENZA VACCINE  08/16/2021      02/20/2021    5:50 PM 04/26/2021    4:32 PM 04/29/2021   11:10 AM 05/23/2021    2:25  PM 08/23/2021   10:46 AM  Fall Risk  Falls in the past year?   0 0 0  Was there an injury with Fall?   0 0 0  Fall Risk Category Calculator   0 0 0  Fall Risk Category   Low Low Low  Patient Fall Risk Level Low fall risk Low fall risk Low fall risk Low fall risk Low fall risk  Patient at Risk for Falls Due to   No Fall Risks No Fall Risks No Fall Risks  Fall risk Follow up   Falls evaluation completed Falls evaluation completed Falls evaluation completed   Functional Status Survey:    Vitals:   08/23/21 1043  BP: (!) 130/98  Pulse: 80  Resp: 16  Temp: (!) 97.4 F (36.3 C)  SpO2: 98%  Weight: 213 lb 12.8 oz (97 kg)  Height: 5\' 6"  (1.676 m)   Body mass index is 34.51 kg/m. Physical Exam Vitals reviewed. Exam conducted with a chaperone present ,CMA).  Constitutional:      General: She is not in acute distress.    Appearance: Normal appearance. She is normal weight. She is not ill-appearing or diaphoretic.  HENT:     Head: Normocephalic.     Right Ear: Tympanic membrane, ear canal and external ear normal. There is no impacted cerumen.     Left Ear: Tympanic membrane, ear canal and external ear normal. There is no impacted cerumen.     Nose: Nose normal. No congestion or rhinorrhea.     Mouth/Throat:     Mouth: Mucous membranes are moist.     Pharynx: Oropharynx is clear. No oropharyngeal exudate or posterior oropharyngeal erythema.  Eyes:     General: No scleral icterus.       Right eye: No discharge.        Left eye: No discharge.     Extraocular Movements: Extraocular movements intact.     Conjunctiva/sclera: Conjunctivae normal.     Pupils: Pupils are equal, round, and reactive to light.  Neck:     Vascular: No carotid bruit.  Cardiovascular:     Rate and Rhythm: Normal rate and regular rhythm.     Pulses: Normal pulses.     Heart sounds: Normal heart sounds. No murmur heard.    No friction rub. No gallop.  Pulmonary:     Effort: Pulmonary effort  is normal. No respiratory distress.     Breath sounds: Normal breath sounds. No wheezing, rhonchi or rales.  Chest:     Chest wall: No tenderness.  Abdominal:     General: Bowel sounds are normal. There is no distension.     Palpations: Abdomen is soft. There is no mass.     Tenderness: There is no abdominal tenderness. There is no right CVA tenderness, left CVA tenderness, guarding or rebound.  Genitourinary:    Exam position: Lithotomy position.     Vagina: Vaginal discharge present. No erythema, tenderness or bleeding.     Cervix: Normal.  Musculoskeletal:  General: No swelling or tenderness. Normal range of motion.     Cervical back: Normal range of motion. No rigidity or tenderness.     Right lower leg: No edema.     Left lower leg: No edema.  Lymphadenopathy:     Cervical: No cervical adenopathy.  Skin:    General: Skin is warm and dry.     Coloration: Skin is not pale.     Findings: Lesion present. No bruising, erythema or rash.     Comments: Multiple skin lesion over bilateral upper eyelid.   Neurological:     Mental Status: She is alert and oriented to person, place, and time.     Motor: No weakness.     Gait: Gait normal.  Psychiatric:        Mood and Affect: Mood normal.        Speech: Speech normal.        Behavior: Behavior normal.    Labs reviewed: Recent Labs    02/02/21 1158 02/20/21 1840 04/26/21 1718  NA 141 138 137  K 3.6 3.9 3.6  CL 105 102 103  CO2 28 28 26   GLUCOSE 97 94 141*  BUN 8 10 10   CREATININE 0.83 0.76 0.92  CALCIUM 8.7 9.0 9.3   Recent Labs    02/02/21 1158  AST 15  ALT 14  ALKPHOS 56  BILITOT 0.3  PROT 6.9  ALBUMIN 4.1   Recent Labs    02/02/21 1158 02/20/21 1840 04/26/21 1718  WBC 5.6 6.0 6.6  NEUTROABS  --  3.7 4.4  HGB 11.3* 11.4* 12.5  HCT 36.1 38.3 40.5  MCV 72.6* 75.0* 73.6*  PLT 264.0 267 319   No results found for: "TSH" No results found for: "HGBA1C" Lab Results  Component Value Date   CHOL 163  02/02/2021   HDL 38.40 (L) 02/02/2021   LDLCALC 109 (H) 02/02/2021   TRIG 78.0 02/02/2021   CHOLHDL 4 02/02/2021    Significant Diagnostic Results in last 30 days:  No results found.  Assessment/Plan  1. Nonintractable headache, unspecified chronicity pattern, unspecified headache type Nurtec supposed given.advised to call provider for prescription is effective. - Advised to get rest patient works 7 days a week with increased social and financial stress since car broke down and still has problems with her living situation.  2. Screen for STD (sexually transmitted disease) Request STD screening since partner sleeps with other partners. - SureSwab Advanced Bacterial Vaginosis (BV), CT/NG, TMA  3. Dysuria Afebrile  - suprapubic tenderness  - Culture, Urine - POC Urinalysis Dipstick indicates dark yellow cloudy urine positive for ketones,trace blood,positive for nitrites and small leukocytes  Will send urine for culture.advised to increase fluid intake.Notify provider if symptoms worsen or running any fever or chills.   4. STD exposure Partner has multiple other partners  - RPR  5. Skin lesion of face Multiple small and < 6 mm lesion on both upper eyelids. - Ambulatory referral to Dermatology . Family/ staff Communication: Reviewed plan of care with patient verbalized understanding   Labs/tests ordered:  - Culture, Urine -- POC Urinalysis Dipstick -  RPR - SureSwab Advanced Bacterial Vaginosis (BV), CT/NG, TMA  Next Appointment: Return if symptoms worsen or fail to improve.   02/04/2021, NP

## 2021-08-24 LAB — SURESWAB® ADVANCED BACTERIAL VAGINOSIS (BV), CT/NG,TMA
C. trachomatis RNA, TMA: NOT DETECTED
N. gonorrhoeae RNA, TMA: NOT DETECTED
SURESWAB(R) ADV BACTERIAL VAGINOSIS(BV),TMA: POSITIVE — AB

## 2021-08-24 LAB — URINE CULTURE
MICRO NUMBER:: 13751480
Result:: NO GROWTH
SPECIMEN QUALITY:: ADEQUATE

## 2021-08-25 ENCOUNTER — Other Ambulatory Visit: Payer: Self-pay

## 2021-08-25 LAB — RPR: RPR Ser Ql: NONREACTIVE

## 2021-08-25 MED ORDER — METRONIDAZOLE 500 MG PO TABS: 500.0000 mg | ORAL_TABLET | Freq: Three times a day (TID) | ORAL | 0 refills | Status: AC

## 2021-09-01 ENCOUNTER — Encounter: Payer: BC Managed Care – PPO | Admitting: Family

## 2021-09-01 ENCOUNTER — Telehealth: Payer: Self-pay | Admitting: *Deleted

## 2021-09-01 NOTE — Progress Notes (Signed)
  This encounter was created in error - please disregard. No show 

## 2021-09-01 NOTE — Telephone Encounter (Signed)
Recommend scheduling appointment with Psychiatry as soon as possible.

## 2021-09-01 NOTE — Telephone Encounter (Signed)
Patient called and left message on Clinical intake stating that she missed her appointment today because she is Manic for a couple days now and just now woke up.   Requesting to Reschedule appointment.   Tried calling patient to reschedule and patient has no Voicemail set up. Will try again later.

## 2021-09-02 NOTE — Telephone Encounter (Signed)
Patient called and stated that she called [facility] [place] because she has been seen there before and they will not schedule her an appointment without a referral from PCP.   Patient scheduled an appointment with Dinah.

## 2021-09-05 ENCOUNTER — Encounter: Payer: Self-pay | Admitting: Family

## 2021-09-05 ENCOUNTER — Ambulatory Visit (INDEPENDENT_AMBULATORY_CARE_PROVIDER_SITE_OTHER): Payer: BC Managed Care – PPO | Admitting: Family

## 2021-09-05 VITALS — BP 138/84 | HR 78 | Temp 97.5°F | Resp 20 | Ht 66.0 in | Wt 214.4 lb

## 2021-09-05 DIAGNOSIS — F319 Bipolar disorder, unspecified: Secondary | ICD-10-CM | POA: Diagnosis not present

## 2021-09-05 DIAGNOSIS — F4310 Post-traumatic stress disorder, unspecified: Secondary | ICD-10-CM | POA: Insufficient documentation

## 2021-09-05 DIAGNOSIS — F411 Generalized anxiety disorder: Secondary | ICD-10-CM | POA: Diagnosis not present

## 2021-09-05 MED ORDER — LAMOTRIGINE 25 MG PO TABS: ORAL_TABLET | ORAL | 0 refills | Status: DC

## 2021-09-05 MED ORDER — ARIPIPRAZOLE 10 MG PO TABS: 10.0000 mg | ORAL_TABLET | Freq: Every day | ORAL | 0 refills | Status: DC

## 2021-09-05 NOTE — Progress Notes (Signed)
Provider: [name] FNP-C  Kelly Ranieri, [name], NP  Patient Care Team: Melicia Esqueda, [name], NP as PCP - General (Family Medicine)  Extended Emergency Contact Information Primary Emergency Contact: [facility] Phone: [phone] Relation: Son Secondary Emergency Contact: [name],[name] Mobile Phone: [phone] Relation: Relative  Code Status:  Full Code  Goals of care: Advanced Directive information    08/23/2021   10:46 AM  Advanced Directives  Does Patient Have a Medical Advance Directive? No  Would patient like information on creating a medical advance directive? No - Patient declined     Chief Complaint  Patient presents with   Acute Visit    Patient presents today for a referral to [facility].    HPI:  Pt is a 41 y.o. [place] seen today for an acute visit for referral to [facility].Has not seen behavioral health provider more than a year.Has been out of her Bipolar medication for a while thought need to be filled only by psychiatry.discuss with her PCP can refill medication until she is able to be seen by Behavioral health.  She request [facility] referral since she has not been seen for a while.she told need a referral from PCP when she called to make appointment.  States has been in manic state.    Past Medical History:  Diagnosis Date   Anxiety    Bipolar 1 disorder (HCC)    PTSD (post-traumatic stress disorder)    Past Surgical History:  Procedure Laterality Date   ABDOMINAL HYSTERECTOMY     Fibroids/heavy menses    No Known Allergies  Outpatient Encounter Medications as of 09/05/2021  Medication Sig   albuterol (VENTOLIN HFA) 108 (90 Base) MCG/ACT inhaler INHALE 1-2 PUFFS BY MOUTH EVERY 6 HOURS AS NEEDED FOR WHEEZE OR SHORTNESS OF BREATH   amLODipine-valsartan (EXFORGE) 10-320 MG tablet Take 1 tablet by mouth daily.   ARIPiprazole (ABILIFY) 10 MG tablet Take 1 tablet (10 mg total) by mouth  daily.   fluticasone (FLONASE) 50 MCG/ACT nasal spray Place 2 sprays into both nostrils daily.   Fluticasone Propionate, Inhal, (FLOVENT DISKUS) 100 MCG/ACT AEPB Inhale 2 puffs by mouth twice daily. Rinse out your mouth after the 2 puffs.   hydrALAZINE (APRESOLINE) 50 MG tablet TAKE 1 TABLET BY MOUTH THREE TIMES A DAY   hydrOXYzine (VISTARIL) 25 MG capsule Take one capsule by mouth in the morning and two at bed time   lamoTRIgine (LAMICTAL) 25 MG tablet Take one tablet by mouth daily for one week and then 2 tab daily   senna-docusate (SENOKOT-S) 8.6-50 MG tablet Take 1 tablet by mouth at bedtime.   No facility-administered encounter medications on file as of 09/05/2021.    Review of Systems  Constitutional:  Negative for appetite change, chills, fatigue, fever and unexpected weight change.  Respiratory:  Negative for cough, chest tightness, shortness of breath and wheezing.   Cardiovascular:  Negative for chest pain, palpitations and leg swelling.  Gastrointestinal:  Negative for abdominal distention, abdominal pain, diarrhea, nausea and vomiting.  Neurological:  Negative for dizziness, syncope, speech difficulty, weakness, light-headedness, numbness and headaches.  Hematological:  Does not bruise/bleed easily.  Psychiatric/Behavioral:  Negative for agitation, behavioral problems, confusion, hallucinations, self-injury, sleep disturbance and suicidal ideas. The patient is not nervous/anxious.        Has been out of her medication for Bipolar     Immunization History  Administered Date(s) Administered   Influenza, Seasonal, Injecte, Preservative Fre 11/26/2014   Influenza,inj,Quad  PF,6+ Mos 11/26/2014, 12/15/2016   Tdap 02/02/2021   Pertinent  Health Maintenance Due  Topic Date Due   INFLUENZA VACCINE  08/16/2021   PAP SMEAR-Modifier  04/27/2024      02/20/2021    5:50 PM 04/26/2021    4:32 PM 04/29/2021   11:10 AM 05/23/2021    2:25 PM 08/23/2021   10:46 AM  Fall Risk  Falls in the  past year?   0 0 0  Was there an injury with Fall?   0 0 0  Fall Risk Category Calculator   0 0 0  Fall Risk Category   Low Low Low  Patient Fall Risk Level Low fall risk Low fall risk Low fall risk Low fall risk Low fall risk  Patient at Risk for Falls Due to   No Fall Risks No Fall Risks No Fall Risks  Fall risk Follow up   Falls evaluation completed Falls evaluation completed Falls evaluation completed   Functional Status Survey:    Vitals:   09/05/21 1033  BP: 138/84  Pulse: 78  Resp: 20  Temp: (!) 97.5 F (36.4 C)  SpO2: 98%  Weight: 214 lb 6.4 oz (97.3 kg)  Height: 5\' 6"  (1.676 m)   Body mass index is 34.61 kg/m. Physical Exam Vitals reviewed.  Constitutional:      General: She is not in acute distress.    Appearance: Normal appearance. She is obese. She is not ill-appearing or diaphoretic.  HENT:     Head: Normocephalic.  Cardiovascular:     Rate and Rhythm: Normal rate and regular rhythm.     Pulses: Normal pulses.     Heart sounds: Normal heart sounds. No murmur heard.    No friction rub. No gallop.  Pulmonary:     Effort: Pulmonary effort is normal. No respiratory distress.     Breath sounds: Normal breath sounds. No wheezing, rhonchi or rales.  Chest:     Chest wall: No tenderness.  Abdominal:     General: Bowel sounds are normal. There is no distension.     Palpations: Abdomen is soft. There is no mass.     Tenderness: There is no abdominal tenderness. There is no right CVA tenderness, left CVA tenderness, guarding or rebound.  Musculoskeletal:        General: No swelling or tenderness. Normal range of motion.     Right lower leg: No edema.     Left lower leg: No edema.  Skin:    General: Skin is warm and dry.     Coloration: Skin is not pale.     Findings: No bruising, erythema, lesion or rash.  Neurological:     Mental Status: She is alert and oriented to person, place, and time.     Cranial Nerves: No cranial nerve deficit.     Sensory: No  sensory deficit.     Motor: No weakness.     Coordination: Coordination normal.     Gait: Gait normal.  Psychiatric:        Mood and Affect: Mood normal.        Speech: Speech normal.        Behavior: Behavior normal.        Thought Content: Thought content normal.        Judgment: Judgment normal.     Labs reviewed: Recent Labs    02/02/21 1158 02/20/21 1840 04/26/21 1718  NA 141 138 137  K 3.6 3.9 3.6  CL 105 102  103  CO2 28 28 26   GLUCOSE 97 94 141*  BUN 8 10 10   CREATININE 0.83 0.76 0.92  CALCIUM 8.7 9.0 9.3   Recent Labs    02/02/21 1158  AST 15  ALT 14  ALKPHOS 56  BILITOT 0.3  PROT 6.9  ALBUMIN 4.1   Recent Labs    02/02/21 1158 02/20/21 1840 04/26/21 1718  WBC 5.6 6.0 6.6  NEUTROABS  --  3.7 4.4  HGB 11.3* 11.4* 12.5  HCT 36.1 38.3 40.5  MCV 72.6* 75.0* 73.6*  PLT 264.0 267 319   No results found for: "TSH" No results found for: "HGBA1C" Lab Results  Component Value Date   CHOL 163 02/02/2021   HDL 38.40 (L) 02/02/2021   LDLCALC 109 (H) 02/02/2021   TRIG 78.0 02/02/2021   CHOLHDL 4 02/02/2021    Significant Diagnostic Results in last 30 days:  No results found.  Assessment/Plan  1. Generalized anxiety disorder Run out of her medication.will refill for one month until able to be seen by Psychiatry service.  - Ambulatory referral to Psychiatry  2. PTSD (post-traumatic stress disorder) Will Refill medication until able to see Psychiatry referral ordered today  - lamoTRIgine (LAMICTAL) 25 MG tablet; Take one tablet by mouth daily for one week and then 2 tab daily  Dispense: 60 tablet; Refill: 0 - ARIPiprazole (ABILIFY) 10 MG tablet; Take 1 tablet (10 mg total) by mouth daily.  Dispense: 30 tablet; Refill: 0 - Ambulatory referral to Psychiatry  3. Bipolar I disorder (HCC) Mood stable reports recent  manic state while she was out of her medication. - lamoTRIgine (LAMICTAL) 25 MG tablet; Take one tablet by mouth daily for one week and  then 2 tab daily  Dispense: 60 tablet; Refill: 0 - ARIPiprazole (ABILIFY) 10 MG tablet; Take 1 tablet (10 mg total) by mouth daily.  Dispense: 30 tablet; Refill: 0 - Ambulatory referral to Psychiatry   Family/ staff Communication: Reviewed plan of care with patient verbalized understanding   Labs/tests ordered: None   Next Appointment: Return if symptoms worsen or fail to improve.   02/04/2021, NP

## 2021-10-17 ENCOUNTER — Other Ambulatory Visit: Payer: BC Managed Care – PPO

## 2021-10-21 ENCOUNTER — Ambulatory Visit (INDEPENDENT_AMBULATORY_CARE_PROVIDER_SITE_OTHER): Payer: BC Managed Care – PPO | Admitting: Family

## 2021-10-21 ENCOUNTER — Encounter: Payer: Self-pay | Admitting: Family

## 2021-10-21 VITALS — BP 138/90 | HR 87 | Temp 97.2°F | Resp 16 | Ht 66.0 in | Wt 212.8 lb

## 2021-10-21 DIAGNOSIS — E782 Mixed hyperlipidemia: Secondary | ICD-10-CM | POA: Diagnosis not present

## 2021-10-21 DIAGNOSIS — R519 Headache, unspecified: Secondary | ICD-10-CM

## 2021-10-21 DIAGNOSIS — F319 Bipolar disorder, unspecified: Secondary | ICD-10-CM

## 2021-10-21 DIAGNOSIS — R350 Frequency of micturition: Secondary | ICD-10-CM

## 2021-10-21 DIAGNOSIS — I10 Essential (primary) hypertension: Secondary | ICD-10-CM | POA: Diagnosis not present

## 2021-10-21 DIAGNOSIS — F411 Generalized anxiety disorder: Secondary | ICD-10-CM | POA: Diagnosis not present

## 2021-10-21 NOTE — Progress Notes (Signed)
Provider: [name] FNP-C   Towana Stenglein, [name], NP  Patient Care Team: Antonina Deziel, [name], NP as PCP - General (Family Medicine)  Extended Emergency Contact Information Primary Emergency Contact: [facility] Phone: [phone] Relation: Son Secondary Emergency Contact: [name],[name] Mobile Phone: [phone] Relation: Relative  Code Status:  Full Code  Goals of care: Advanced Directive information    10/21/2021   11:24 AM  Advanced Directives  Does Patient Have a Medical Advance Directive? No  Would patient like information on creating a medical advance directive? No - Patient declined     Chief Complaint  Patient presents with   Medical Management of Chronic Issues    6 month follow up.   Immunizations    Discuss the need for Influenza vaccine.    Concern    Patient states she has been stressed and it's causing issues with her mental health. Patient is Bipolar and states medications are not working.     HPI:  Pt is a 41 y.o. female seen today for 6 months for  medical management of chronic diseases.    Hypertension - Readings have been high due to stress level had to leave work recently due to stress level.Has not been able to get own apartment states had a past record when she did not pay her rent since she was sick.Her daughter is also expecting and her living condition is not good.wishes she had a place to live so she can help her daughter.this has added up to her stress level.  Bipolar - has not found a psychiatry practice to see her.  Due for flu shot but going for a class reunion will come back to get vaccine.   Headaches - described as constant.no nausea or vomiting     Past Medical History:  Diagnosis Date   Anxiety    Bipolar 1 disorder (HCC)    PTSD (post-traumatic stress disorder)    Past Surgical History:  Procedure Laterality Date   ABDOMINAL HYSTERECTOMY     Fibroids/heavy menses    No Known Allergies  Allergies as of  10/21/2021   No Known Allergies      Medication List        Accurate as of October 21, 2021 11:37 AM. If you have any questions, ask your nurse or doctor.          albuterol 108 (90 Base) MCG/ACT inhaler Commonly known as: VENTOLIN HFA INHALE 1-2 PUFFS BY MOUTH EVERY 6 HOURS AS NEEDED FOR WHEEZE OR SHORTNESS OF BREATH   amLODipine-valsartan 10-320 MG tablet Commonly known as: EXFORGE Take 1 tablet by mouth daily.   ARIPiprazole 10 MG tablet Commonly known as: ABILIFY Take 1 tablet (10 mg total) by mouth daily.   Flovent Diskus 100 MCG/ACT Aepb Generic drug: Fluticasone Propionate (Inhal) Inhale 2 puffs by mouth twice daily. Rinse out your mouth after the 2 puffs.   fluticasone 50 MCG/ACT nasal spray Commonly known as: FLONASE Place 2 sprays into both nostrils daily.   hydrALAZINE 50 MG tablet Commonly known as: APRESOLINE TAKE 1 TABLET BY MOUTH THREE TIMES A DAY   hydrOXYzine 25 MG capsule Commonly known as: Vistaril Take one capsule by mouth in the morning and two at bed time   lamoTRIgine 25 MG tablet Commonly known as: LaMICtal Take one tablet by mouth daily for one week and then 2 tab daily   senna-docusate 8.6-50 MG tablet Commonly known as: Senokot-S Take 1 tablet by mouth at bedtime.  Review of Systems  Constitutional:  Negative for appetite change, chills, fatigue, fever and unexpected weight change.  HENT:  Negative for congestion, dental problem, ear discharge, ear pain, facial swelling, hearing loss, nosebleeds, postnasal drip, rhinorrhea, sinus pressure, sinus pain, sneezing, sore throat, tinnitus and trouble swallowing.   Eyes:  Negative for pain, discharge, redness, itching and visual disturbance.  Respiratory:  Negative for cough, chest tightness, shortness of breath and wheezing.   Cardiovascular:  Negative for chest pain, palpitations and leg swelling.  Gastrointestinal:  Negative for abdominal distention, abdominal pain, blood in  stool, constipation, diarrhea, nausea and vomiting.  Endocrine: Negative for cold intolerance, heat intolerance, polydipsia, polyphagia and polyuria.  Genitourinary:  Negative for difficulty urinating, dysuria, flank pain, frequency and urgency.  Musculoskeletal:  Negative for arthralgias, back pain, gait problem, joint swelling, myalgias, neck pain and neck stiffness.  Skin:  Negative for color change, pallor, rash and wound.  Neurological:  Negative for dizziness, syncope, speech difficulty, weakness, light-headedness, numbness and headaches.  Hematological:  Does not bruise/bleed easily.  Psychiatric/Behavioral:  Negative for agitation, behavioral problems, confusion, hallucinations, self-injury, sleep disturbance and suicidal ideas. The patient is not nervous/anxious.     Immunization History  Administered Date(s) Administered   Influenza, Seasonal, Injecte, Preservative Fre 11/26/2014   Influenza,inj,Quad PF,6+ Mos 11/26/2014, 12/15/2016   Tdap 02/02/2021   Pertinent  Health Maintenance Due  Topic Date Due   INFLUENZA VACCINE  08/16/2021   PAP SMEAR-Modifier  04/27/2024      04/26/2021    4:32 PM 04/29/2021   11:10 AM 05/23/2021    2:25 PM 08/23/2021   10:46 AM 10/21/2021   11:24 AM  Fall Risk  Falls in the past year?  0 0 0 0  Was there an injury with Fall?  0 0 0 0  Fall Risk Category Calculator  0 0 0 0  Fall Risk Category  Low Low Low Low  Patient Fall Risk Level Low fall risk Low fall risk Low fall risk Low fall risk Low fall risk  Patient at Risk for Falls Due to  No Fall Risks No Fall Risks No Fall Risks No Fall Risks  Fall risk Follow up  Falls evaluation completed Falls evaluation completed Falls evaluation completed Falls evaluation completed   Functional Status Survey:    Vitals:   10/21/21 1123  BP: (!) 138/90  Pulse: 87  Resp: 16  Temp: (!) 97.2 F (36.2 C)  SpO2: 98%  Weight: 212 lb 12.8 oz (96.5 kg)  Height: 5\' 6"  (1.676 m)   Body mass index is 34.35  kg/m. Physical Exam Vitals reviewed.  Constitutional:      General: She is not in acute distress.    Appearance: Normal appearance. She is obese. She is not ill-appearing or diaphoretic.  HENT:     Head: Normocephalic.     Right Ear: Tympanic membrane, ear canal and external ear normal. There is no impacted cerumen.     Left Ear: Tympanic membrane, ear canal and external ear normal. There is no impacted cerumen.     Nose: Nose normal. No congestion or rhinorrhea.     Mouth/Throat:     Mouth: Mucous membranes are moist.     Pharynx: Oropharynx is clear. No oropharyngeal exudate or posterior oropharyngeal erythema.  Eyes:     General: No scleral icterus.       Right eye: No discharge.        Left eye: No discharge.     Extraocular Movements:  Extraocular movements intact.     Conjunctiva/sclera: Conjunctivae normal.     Pupils: Pupils are equal, round, and reactive to light.  Neck:     Vascular: No carotid bruit.  Cardiovascular:     Rate and Rhythm: Normal rate and regular rhythm.     Pulses: Normal pulses.     Heart sounds: Normal heart sounds. No murmur heard.    No friction rub. No gallop.  Pulmonary:     Effort: Pulmonary effort is normal. No respiratory distress.     Breath sounds: Normal breath sounds. No wheezing, rhonchi or rales.  Chest:     Chest wall: No tenderness.  Abdominal:     General: Bowel sounds are normal. There is no distension.     Palpations: Abdomen is soft. There is no mass.     Tenderness: There is no abdominal tenderness. There is no right CVA tenderness, left CVA tenderness, guarding or rebound.  Musculoskeletal:        General: No swelling or tenderness. Normal range of motion.     Cervical back: Normal range of motion. No rigidity or tenderness.     Right lower leg: No edema.     Left lower leg: No edema.  Lymphadenopathy:     Cervical: No cervical adenopathy.  Skin:    General: Skin is warm and dry.     Coloration: Skin is not pale.      Findings: No bruising, erythema, lesion or rash.  Neurological:     Mental Status: She is alert and oriented to person, place, and time.     Cranial Nerves: No cranial nerve deficit.     Sensory: No sensory deficit.     Motor: No weakness.     Coordination: Coordination normal.     Gait: Gait normal.  Psychiatric:        Mood and Affect: Mood normal.        Speech: Speech normal.        Behavior: Behavior normal.        Thought Content: Thought content normal.        Judgment: Judgment normal.    Labs reviewed: Recent Labs    02/02/21 1158 02/20/21 1840 04/26/21 1718  NA 141 138 137  K 3.6 3.9 3.6  CL 105 102 103  CO2 28 28 26   GLUCOSE 97 94 141*  BUN 8 10 10   CREATININE 0.83 0.76 0.92  CALCIUM 8.7 9.0 9.3   Recent Labs    02/02/21 1158  AST 15  ALT 14  ALKPHOS 56  BILITOT 0.3  PROT 6.9  ALBUMIN 4.1   Recent Labs    02/02/21 1158 02/20/21 1840 04/26/21 1718  WBC 5.6 6.0 6.6  NEUTROABS  --  3.7 4.4  HGB 11.3* 11.4* 12.5  HCT 36.1 38.3 40.5  MCV 72.6* 75.0* 73.6*  PLT 264.0 267 319   No results found for: "TSH" No results found for: "HGBA1C" Lab Results  Component Value Date   CHOL 163 02/02/2021   HDL 38.40 (L) 02/02/2021   LDLCALC 109 (H) 02/02/2021   TRIG 78.0 02/02/2021   CHOLHDL 4 02/02/2021    Significant Diagnostic Results in last 30 days:  No results found.  Assessment/Plan  1. Essential hypertension Blood pressure stable -Continue on hydralazine and amlodipine-valsartan -Stress management techniques advised  2. Generalized anxiety disorder Continue on hydroxyzine -Continue to follow-up with counseling  3. Bipolar I disorder ([place]) Mood stable -Advised to continue to make up appointment with  a psychiatrist -Continue on Lamictal, and hydroxyzine and Abilify  4. Nonintractable headache, unspecified chronicity pattern, unspecified headache type Stable febrile  5. Urine frequency Afebrile -Advised to increase fluid intake we  will obtain urine to rule out urinary tract infection. - Urine Culture  Family/ staff Communication: Reviewed plan of care with patient verbalized understanding  Labs/tests ordered:  - Urine Culture  Next Appointment : Return in about 6 months (around 04/22/2022) for medical mangement of chronic issues.Caesar Bookman, NP

## 2021-10-22 LAB — COMPLETE METABOLIC PANEL WITH GFR
AG Ratio: 1.4 (calc) (ref 1.0–2.5)
ALT: 14 U/L (ref 6–29)
AST: 13 U/L (ref 10–30)
Albumin: 4.1 g/dL (ref 3.6–5.1)
Alkaline phosphatase (APISO): 61 U/L (ref 31–125)
BUN: 8 mg/dL (ref 7–25)
CO2: 28 mmol/L (ref 20–32)
Calcium: 9.2 mg/dL (ref 8.6–10.2)
Chloride: 106 mmol/L (ref 98–110)
Creat: 0.84 mg/dL (ref 0.50–0.99)
Globulin: 2.9 g/dL (calc) (ref 1.9–3.7)
Glucose, Bld: 89 mg/dL (ref 65–99)
Potassium: 4 mmol/L (ref 3.5–5.3)
Sodium: 141 mmol/L (ref 135–146)
Total Bilirubin: 0.3 mg/dL (ref 0.2–1.2)
Total Protein: 7 g/dL (ref 6.1–8.1)
eGFR: 88 mL/min/{1.73_m2} (ref >=60)

## 2021-10-22 LAB — CBC WITH DIFFERENTIAL/PLATELET
Absolute Monocytes: 203 cells/uL (ref 200–950)
Basophils Absolute: 41 cells/uL (ref 0–200)
Basophils Relative: 0.9 %
Eosinophils Absolute: 72 cells/uL (ref 15–500)
Eosinophils Relative: 1.6 %
HCT: 39.3 % (ref 35.0–45.0)
Hemoglobin: 11.9 g/dL (ref 11.7–15.5)
Lymphs Abs: 1796 cells/uL (ref 850–3900)
MCH: 22.5 pg — ABNORMAL LOW (ref 27.0–33.0)
MCHC: 30.3 g/dL — ABNORMAL LOW (ref 32.0–36.0)
MCV: 74.4 fL — ABNORMAL LOW (ref 80.0–100.0)
MPV: 9.8 fL (ref 7.5–12.5)
Monocytes Relative: 4.5 %
Neutro Abs: 2390 cells/uL (ref 1500–7800)
Neutrophils Relative %: 53.1 %
Platelets: 315 10*3/uL (ref 140–400)
RBC: 5.28 10*6/uL — ABNORMAL HIGH (ref 3.80–5.10)
RDW: 15.3 % — ABNORMAL HIGH (ref 11.0–15.0)
Total Lymphocyte: 39.9 %
WBC: 4.5 10*3/uL (ref 3.8–10.8)

## 2021-10-22 LAB — URINE CULTURE
MICRO NUMBER:: 14018712
SPECIMEN QUALITY:: ADEQUATE

## 2021-10-22 LAB — LIPID PANEL
Cholesterol: 187 mg/dL (ref <200)
HDL: 47 mg/dL — ABNORMAL LOW (ref >=50)
LDL Cholesterol (Calc): 121 mg/dL (calc) — ABNORMAL HIGH
Non-HDL Cholesterol (Calc): 140 mg/dL (calc) — ABNORMAL HIGH (ref <130)
Total CHOL/HDL Ratio: 4 (calc) (ref <5.0)
Triglycerides: 87 mg/dL (ref <150)

## 2021-10-22 LAB — TSH: TSH: 2.16 mIU/L

## 2022-01-13 ENCOUNTER — Emergency Department (HOSPITAL_BASED_OUTPATIENT_CLINIC_OR_DEPARTMENT_OTHER)
Admission: EM | Admit: 2022-01-13 | Discharge: 2022-01-13 | Disposition: A | Discharge status: Home / Self Care | Payer: BC Managed Care – PPO | Attending: Emergency Medicine | Admitting: Emergency Medicine

## 2022-01-13 ENCOUNTER — Encounter (HOSPITAL_BASED_OUTPATIENT_CLINIC_OR_DEPARTMENT_OTHER): Payer: Self-pay | Admitting: Emergency Medicine

## 2022-01-13 ENCOUNTER — Other Ambulatory Visit: Payer: Self-pay

## 2022-01-13 ENCOUNTER — Emergency Department (HOSPITAL_BASED_OUTPATIENT_CLINIC_OR_DEPARTMENT_OTHER): Payer: BC Managed Care – PPO | Admitting: Radiology

## 2022-01-13 DIAGNOSIS — Z7951 Long term (current) use of inhaled steroids: Secondary | ICD-10-CM | POA: Diagnosis not present

## 2022-01-13 DIAGNOSIS — J181 Lobar pneumonia, unspecified organism: Secondary | ICD-10-CM | POA: Insufficient documentation

## 2022-01-13 DIAGNOSIS — J189 Pneumonia, unspecified organism: Secondary | ICD-10-CM | POA: Diagnosis not present

## 2022-01-13 DIAGNOSIS — Z1152 Encounter for screening for COVID-19: Secondary | ICD-10-CM | POA: Insufficient documentation

## 2022-01-13 DIAGNOSIS — R059 Cough, unspecified: Secondary | ICD-10-CM | POA: Diagnosis not present

## 2022-01-13 DIAGNOSIS — R918 Other nonspecific abnormal finding of lung field: Secondary | ICD-10-CM | POA: Diagnosis not present

## 2022-01-13 LAB — RESP PANEL BY RT-PCR (RSV, FLU A&B, COVID)  RVPGX2
Influenza A by PCR: NEGATIVE
Influenza B by PCR: NEGATIVE
Resp Syncytial Virus by PCR: NEGATIVE
SARS Coronavirus 2 by RT PCR: NEGATIVE

## 2022-01-13 MED ORDER — ALBUTEROL SULFATE HFA 108 (90 BASE) MCG/ACT IN AERS
2.0000 | INHALATION_SPRAY | Freq: Once | RESPIRATORY_TRACT | Status: AC
  Administered 2022-01-13: 2 via RESPIRATORY_TRACT

## 2022-01-13 MED ORDER — AZITHROMYCIN 250 MG PO TABS
500.0000 mg | ORAL_TABLET | Freq: Once | ORAL | Status: AC
  Administered 2022-01-13: 500 mg via ORAL
  Filled 2022-01-13: qty 2

## 2022-01-13 MED ORDER — AMOXICILLIN-POT CLAVULANATE 875-125 MG PO TABS: 1.0000 | ORAL_TABLET | Freq: Two times a day (BID) | ORAL | 0 refills | Status: DC

## 2022-01-13 MED ORDER — ALBUTEROL SULFATE HFA 108 (90 BASE) MCG/ACT IN AERS
2.0000 | INHALATION_SPRAY | RESPIRATORY_TRACT | Status: DC | PRN
  Filled 2022-01-13: qty 6.7

## 2022-01-13 MED ORDER — DEXAMETHASONE 4 MG PO TABS
10.0000 mg | ORAL_TABLET | Freq: Once | ORAL | Status: AC
  Administered 2022-01-13: 10 mg via ORAL
  Filled 2022-01-13: qty 3

## 2022-01-13 MED ORDER — AMOXICILLIN-POT CLAVULANATE 875-125 MG PO TABS
1.0000 | ORAL_TABLET | Freq: Once | ORAL | Status: AC
  Administered 2022-01-13: 1 via ORAL
  Filled 2022-01-13: qty 1

## 2022-01-13 MED ORDER — AZITHROMYCIN 250 MG PO TABS: 250.0000 mg | ORAL_TABLET | Freq: Every day | ORAL | 0 refills | Status: DC

## 2022-01-13 NOTE — Discharge Instructions (Addendum)
Your chest x-ray shows pneumonia.  Take ibuprofen and Tylenol for fever.  You are being prescribed 2 different antibiotics to cover the most likely organisms causing this.    You were given your first dose of antibiotics. You can start the antibiotic prescription tomorrow.  If you do not feel better in the next 48 hours, your cough has blood in it, you develop new or worsening shortness of breath, vomiting, or any other new/concerning symptoms then return to the ER for evaluation.

## 2022-01-13 NOTE — ED Triage Notes (Signed)
Presents for SOB and productively green cough, fever x3 days.  Tried mucinex, tylenol for fever. H/o asthma, has tried her rescue inhaler

## 2022-01-13 NOTE — ED Notes (Signed)
RT note: Pt. seen prior with this RT placing Albuterol Inhaler in room w/Spacer to be used X1 and to replaced one currently that is low/empty, per pt., RT to follow.

## 2022-01-13 NOTE — ED Provider Notes (Signed)
[facility] EMERGENCY DEPT Provider Note   CSN: [id number] Arrival date & time: 01/13/22  1119     History  Chief Complaint  Patient presents with   Cough    Deanna Powell is a 41 y.o. female.  HPI 41 year old female with a history of asthma presents with cough and shortness of breath.  She is felt like she had a fever as well though has not checked her temperature.  Cough is productive.  She has had some on and off wheezing as well.  She also feels like she is having some change in voice though no sore throat.  Her albuterol inhaler does not seem to be helping.  Home Medications Prior to Admission medications   Medication Sig Start Date End Date Taking? Authorizing Provider  amoxicillin-clavulanate (AUGMENTIN) 875-125 MG tablet Take 1 tablet by mouth every 12 (twelve) hours. 01/13/22  Yes Pricilla Loveless, MD  azithromycin (ZITHROMAX) 250 MG tablet Take 1 tablet (250 mg total) by mouth daily. 01/13/22  Yes Pricilla Loveless, MD  albuterol (VENTOLIN HFA) 108 (90 Base) MCG/ACT inhaler INHALE 1-2 PUFFS BY MOUTH EVERY 6 HOURS AS NEEDED FOR WHEEZE OR SHORTNESS OF BREATH 05/16/21   Ngetich, Dinah C, NP  amLODipine-valsartan (EXFORGE) 10-320 MG tablet Take 1 tablet by mouth daily. 03/07/21   [name], DO  ARIPiprazole (ABILIFY) 10 MG tablet Take 1 tablet (10 mg total) by mouth daily. 09/05/21   Ngetich, Dinah C, NP  fluticasone (FLONASE) 50 MCG/ACT nasal spray Place 2 sprays into both nostrils daily. 01/12/21   Henderly, Britni A, PA-C  Fluticasone Propionate, Inhal, (FLOVENT DISKUS) 100 MCG/ACT AEPB Inhale 2 puffs by mouth twice daily. Rinse out your mouth after the 2 puffs. 01/26/21   [name], DO  hydrALAZINE (APRESOLINE) 50 MG tablet TAKE 1 TABLET BY MOUTH THREE TIMES A DAY 08/11/21   Ngetich, Dinah C, NP  hydrOXYzine (VISTARIL) 25 MG capsule Take one capsule by mouth in the morning and two at bed time 01/26/21   [name], DO   lamoTRIgine (LAMICTAL) 25 MG tablet Take one tablet by mouth daily for one week and then 2 tab daily 09/05/21   Ngetich, Dinah C, NP  senna-docusate (SENOKOT-S) 8.6-50 MG tablet Take 1 tablet by mouth at bedtime. 05/23/21   Ngetich, [name], NP      Allergies    Patient has no known allergies.    Review of Systems   Review of Systems  Constitutional:  Positive for fever.  Respiratory:  Positive for cough, shortness of breath and wheezing.   Musculoskeletal:  Negative for myalgias.    Physical Exam Updated Vital Signs BP (!) 161/102 (BP Location: Right Arm)   Pulse (!) 106   Temp 97.8 F (36.6 C) (Oral)   Resp 20   Wt 101.2 kg   SpO2 99%   BMI 35.99 kg/m  Physical Exam Vitals and nursing note reviewed.  Constitutional:      General: She is not in acute distress.    Appearance: She is well-developed. She is obese. She is not ill-appearing or diaphoretic.  HENT:     Head: Normocephalic and atraumatic.  Cardiovascular:     Rate and Rhythm: Normal rate and regular rhythm.     Heart sounds: Normal heart sounds.  Pulmonary:     Effort: Pulmonary effort is normal.     Breath sounds: Normal breath sounds. No wheezing.     Comments: Deep inspiration induces some coughing but no obvious wheezing Abdominal:  Palpations: Abdomen is soft.     Tenderness: There is no abdominal tenderness.  Skin:    General: Skin is warm and dry.  Neurological:     Mental Status: She is alert.     ED Results / Procedures / Treatments   Labs (all labs ordered are listed, but only abnormal results are displayed) Labs Reviewed  RESP PANEL BY RT-PCR (RSV, FLU A&B, COVID)  [id number]    EKG None  Radiology DG Chest 2 View  Result Date: 01/13/2022 CLINICAL DATA:  Three day history of shortness of breath and productive cough EXAM: CHEST - 2 VIEW COMPARISON:  Chest radiograph dated 04/27/2018 FINDINGS: Low lung volumes. Left lingular patchy opacity. No pleural effusion or pneumothorax. The heart  size and mediastinal contours are within normal limits. The visualized skeletal structures are unremarkable. IMPRESSION: Left lingular patchy opacity, which may reflect atelectasis, aspiration, or pneumonia. Somewhat suboptimal evaluation of the upper lungs due to the patient's superimposed hair artifact. Electronically Signed   By: Agustin Cree M.D.   On: 01/13/2022 12:01    Procedures Procedures    Medications Ordered in ED Medications  albuterol (VENTOLIN HFA) 108 (90 Base) MCG/ACT inhaler 2 puff (has no administration in time range)  amoxicillin-clavulanate (AUGMENTIN) 875-125 MG per tablet 1 tablet (has no administration in time range)  azithromycin (ZITHROMAX) tablet 500 mg (has no administration in time range)  dexamethasone (DECADRON) tablet 10 mg (has no administration in time range)  albuterol (VENTOLIN HFA) 108 (90 Base) MCG/ACT inhaler 2 puff (has no administration in time range)    ED Course/ Medical Decision Making/ A&P                           Medical Decision Making Amount and/or Complexity of Data Reviewed Labs: ordered.    Details: Viral PCR testing is negative Radiology: ordered and independent interpretation performed.    Details: Left lower lobe infiltrate  Risk Prescription drug management.   Patient appears to have pneumonia.  She is mildly tachycardic but her oxygen is normal and her work of breathing is normal.  I do not hear any obvious wheezing but with her history of asthma and on and off wheezing for her will give a dose of Decadron.  Will also give her an inhaler here that she can take home and use.  Ultimately I think she should go on antibiotics and given her chronic lung disease we will put on Augmentin and azithromycin.  Otherwise, will give work note.  At this point, she is well-appearing and appears stable for discharge home with return precautions.        Final Clinical Impression(s) / ED Diagnoses Final diagnoses:  Community acquired  pneumonia of left lower lobe of lung    Rx / DC Orders ED Discharge Orders          Ordered    amoxicillin-clavulanate (AUGMENTIN) 875-125 MG tablet  Every 12 hours        01/13/22 1411    azithromycin (ZITHROMAX) 250 MG tablet  Daily        01/13/22 1411              Pricilla Loveless, MD 01/13/22 1421

## 2022-01-18 ENCOUNTER — Encounter: Payer: Self-pay | Admitting: Family

## 2022-01-18 ENCOUNTER — Ambulatory Visit (INDEPENDENT_AMBULATORY_CARE_PROVIDER_SITE_OTHER): Payer: BC Managed Care – PPO | Admitting: Family

## 2022-01-18 VITALS — BP 138/100 | HR 87 | Temp 97.1°F | Resp 16 | Ht 66.0 in | Wt 219.4 lb

## 2022-01-18 DIAGNOSIS — R062 Wheezing: Secondary | ICD-10-CM | POA: Diagnosis not present

## 2022-01-18 DIAGNOSIS — R059 Cough, unspecified: Secondary | ICD-10-CM | POA: Diagnosis not present

## 2022-01-18 MED ORDER — PREDNISONE 20 MG PO TABS: ORAL_TABLET | ORAL | 0 refills | Status: AC

## 2022-01-18 MED ORDER — BENZONATATE 100 MG PO CAPS: 100.0000 mg | ORAL_CAPSULE | Freq: Three times a day (TID) | ORAL | 0 refills | Status: DC | PRN

## 2022-01-18 NOTE — Patient Instructions (Addendum)
-   Notify provider if symptoms worsen or fail to improve  °

## 2022-01-18 NOTE — Progress Notes (Signed)
Provider: [name] FNP-C  Aliveah Gallant, [name], NP  Patient Care Team: Cyan Clippinger, [name], NP as PCP - General (Family Medicine)  Extended Emergency Contact Information Primary Emergency Contact: [facility] Phone: [phone] Relation: Son Secondary Emergency Contact: [name],[name] Mobile Phone: [phone] Relation: Relative  Code Status:  Full Code  Goals of care: Advanced Directive information    01/18/2022   10:47 AM  Advanced Directives  Does Patient Have a Medical Advance Directive? No  Would patient like information on creating a medical advance directive? No - Patient declined     Chief Complaint  Patient presents with   Hospitalization Follow-up    Hospital follow up 01/13/2022    HPI:  Pt is a 42 y.o. female seen today for an acute visit for follow up ED 01/13/2022 after she presented with productive cough and shortness of breath.  Also felt like she had some changes in her voice but no sore throat.  Albuterol did not seem to be helping.  She was treated with a dose of Decadron and prescribed Augmentin and Z-Pak.  She was also given work note.  Her condition stabilized and was discharged home. Has taken OTC mucinex but not helping.  No contact with sick person with COVID-19.   Past Medical History:  Diagnosis Date   Anxiety    Bipolar 1 disorder (HCC)    PTSD (post-traumatic stress disorder)    Past Surgical History:  Procedure Laterality Date   ABDOMINAL HYSTERECTOMY     Fibroids/heavy menses    No Known Allergies  Outpatient Encounter Medications as of 01/18/2022  Medication Sig   albuterol (VENTOLIN HFA) 108 (90 Base) MCG/ACT inhaler INHALE 1-2 PUFFS BY MOUTH EVERY 6 HOURS AS NEEDED FOR WHEEZE OR SHORTNESS OF BREATH   amLODipine-valsartan (EXFORGE) 10-320 MG tablet Take 1 tablet by mouth daily.   amoxicillin-clavulanate (AUGMENTIN) 875-125 MG tablet Take 1 tablet by mouth every 12 (twelve) hours.   ARIPiprazole (ABILIFY) 10 MG tablet  Take 1 tablet (10 mg total) by mouth daily.   azithromycin (ZITHROMAX) 250 MG tablet Take 1 tablet (250 mg total) by mouth daily.   benzonatate (TESSALON) 100 MG capsule Take 1 capsule (100 mg total) by mouth 3 (three) times daily as needed for cough.   fluticasone (FLONASE) 50 MCG/ACT nasal spray Place 2 sprays into both nostrils daily.   Fluticasone Propionate, Inhal, (FLOVENT DISKUS) 100 MCG/ACT AEPB Inhale 2 puffs by mouth twice daily. Rinse out your mouth after the 2 puffs.   hydrALAZINE (APRESOLINE) 50 MG tablet TAKE 1 TABLET BY MOUTH THREE TIMES A DAY   hydrOXYzine (VISTARIL) 25 MG capsule Take one capsule by mouth in the morning and two at bed time   lamoTRIgine (LAMICTAL) 25 MG tablet Take one tablet by mouth daily for one week and then 2 tab daily   predniSONE (DELTASONE) 20 MG tablet Take 2 tablets (40 mg total) by mouth daily with breakfast for 1 day, THEN 1.5 tablets (30 mg total) daily with breakfast for 1 day, THEN 1 tablet (20 mg total) daily with breakfast for 1 day, THEN 0.5 tablets (10 mg total) daily with breakfast for 1 day.   senna-docusate (SENOKOT-S) 8.6-50 MG tablet Take 1 tablet by mouth at bedtime.   No facility-administered encounter medications on file as of 01/18/2022.    Review of Systems  Constitutional:  Negative for appetite change, chills, fatigue, fever and unexpected weight change.  HENT:  Negative for congestion, dental problem, ear discharge, ear pain, hearing loss,  postnasal drip, rhinorrhea, sinus pressure, sinus pain, sneezing, sore throat, tinnitus and trouble swallowing.   Eyes:  Negative for pain, discharge, redness, itching and visual disturbance.  Respiratory:  Positive for cough. Negative for chest tightness, shortness of breath and wheezing.   Cardiovascular:  Negative for chest pain, palpitations and leg swelling.  Gastrointestinal:  Negative for abdominal distention, abdominal pain, blood in stool, constipation, diarrhea, nausea and vomiting.   Skin:  Negative for color change, pallor, rash and wound.  Neurological:  Negative for dizziness, syncope, speech difficulty, weakness, light-headedness, numbness and headaches.  Hematological:  Does not bruise/bleed easily.    Immunization History  Administered Date(s) Administered   Influenza, Seasonal, Injecte, Preservative Fre 11/26/2014   Influenza,inj,Quad PF,6+ Mos 11/26/2014, 12/15/2016   Tdap 02/02/2021   Pertinent  Health Maintenance Due  Topic Date Due   INFLUENZA VACCINE  08/16/2021   PAP SMEAR-Modifier  04/27/2024      05/23/2021    2:25 PM 08/23/2021   10:46 AM 10/21/2021   11:24 AM 01/13/2022   11:33 AM 01/18/2022   10:46 AM  Fall Risk  Falls in the past year? 0 0 0  0  Was there an injury with Fall? 0 0 0  0  Fall Risk Category Calculator 0 0 0  0  Fall Risk Category Low Low Low  Low  Patient Fall Risk Level Low fall risk Low fall risk Low fall risk Low fall risk Low fall risk  Patient at Risk for Falls Due to No Fall Risks No Fall Risks No Fall Risks  No Fall Risks  Fall risk Follow up Falls evaluation completed Falls evaluation completed Falls evaluation completed  Falls evaluation completed   Functional Status Survey:    Vitals:   01/18/22 1045 01/18/22 1131  BP: (!) 140/100 (!) 138/100  Pulse: 87   Resp: 16   Temp: (!) 97.1 F (36.2 C)   SpO2: 97%   Weight: 219 lb 6.4 oz (99.5 kg)   Height: 5\' 6"  (1.676 m)    Body mass index is 35.41 kg/m. Physical Exam Vitals reviewed.  Constitutional:      General: She is not in acute distress.    Appearance: Normal appearance. She is normal weight. She is not ill-appearing or diaphoretic.  HENT:     Head: Normocephalic.     Right Ear: Tympanic membrane, ear canal and external ear normal. There is no impacted cerumen.     Left Ear: Tympanic membrane, ear canal and external ear normal. There is no impacted cerumen.     Nose: Nose normal. No congestion or rhinorrhea.     Mouth/Throat:     Mouth: Mucous  membranes are moist.     Pharynx: Oropharynx is clear. No oropharyngeal exudate or posterior oropharyngeal erythema.  Eyes:     General: No scleral icterus.       Right eye: No discharge.        Left eye: No discharge.     Extraocular Movements: Extraocular movements intact.     Conjunctiva/sclera: Conjunctivae normal.     Pupils: Pupils are equal, round, and reactive to light.  Neck:     Vascular: No carotid bruit.  Cardiovascular:     Rate and Rhythm: Normal rate and regular rhythm.     Pulses: Normal pulses.     Heart sounds: Normal heart sounds. No murmur heard.    No friction rub. No gallop.  Pulmonary:     Effort: Pulmonary effort is normal. No respiratory  distress.     Breath sounds: Normal breath sounds. No wheezing, rhonchi or rales.  Chest:     Chest wall: No tenderness.  Abdominal:     General: Bowel sounds are normal. There is no distension.     Palpations: Abdomen is soft. There is no mass.     Tenderness: There is no abdominal tenderness. There is no right CVA tenderness, left CVA tenderness, guarding or rebound.     Comments: Diminished wheezes  Musculoskeletal:     Cervical back: Normal range of motion. No rigidity or tenderness.  Lymphadenopathy:     Cervical: No cervical adenopathy.  Skin:    Coloration: Skin is not pale.     Findings: No erythema or rash.  Neurological:     Mental Status: She is alert and oriented to person, place, and time.     Gait: Gait normal.  Psychiatric:        Speech: Speech normal.    Labs reviewed: Recent Labs    02/20/21 1840 04/26/21 1718 10/21/21 1156  NA 138 137 141  K 3.9 3.6 4.0  CL 102 103 106  CO2 28 26 28   GLUCOSE 94 141* 89  BUN 10 10 8   CREATININE 0.76 0.92 0.84  CALCIUM 9.0 9.3 9.2   Recent Labs    02/02/21 1158 10/21/21 1156  AST 15 13  ALT 14 14  ALKPHOS 56  --   BILITOT 0.3 0.3  PROT 6.9 7.0  ALBUMIN 4.1  --    Recent Labs    02/20/21 1840 04/26/21 1718 10/21/21 1156  WBC 6.0 6.6 4.5   NEUTROABS 3.7 4.4 2,390  HGB 11.4* 12.5 11.9  HCT 38.3 40.5 39.3  MCV 75.0* 73.6* 74.4*  PLT 267 319 315   Lab Results  Component Value Date   TSH 2.16 10/21/2021   No results found for: "HGBA1C" Lab Results  Component Value Date   CHOL 187 10/21/2021   HDL 47 (L) 10/21/2021   LDLCALC 121 (H) 10/21/2021   TRIG 87 10/21/2021   CHOLHDL 4.0 10/21/2021    Significant Diagnostic Results in last 30 days:  DG Chest 2 View  Result Date: 01/13/2022 CLINICAL DATA:  Three day history of shortness of breath and productive cough EXAM: CHEST - 2 VIEW COMPARISON:  Chest radiograph dated 04/27/2018 FINDINGS: Low lung volumes. Left lingular patchy opacity. No pleural effusion or pneumothorax. The heart size and mediastinal contours are within normal limits. The visualized skeletal structures are unremarkable. IMPRESSION: Left lingular patchy opacity, which may reflect atelectasis, aspiration, or pneumonia. Somewhat suboptimal evaluation of the upper lungs due to the patient's superimposed hair artifact. Electronically Signed   By: Darrin Nipper M.D.   On: 01/13/2022 12:01    Assessment/Plan 1. Cough, unspecified type Afebrile  -start on benzonatate for cough  Tapered prednisone  - benzonatate (TESSALON) 100 MG capsule; Take 1 capsule (100 mg total) by mouth 3 (three) times daily as needed for cough.  Dispense: 20 capsule; Refill: 0 - predniSONE (DELTASONE) 20 MG tablet; Take 2 tablets (40 mg total) by mouth daily with breakfast for 1 day, THEN 1.5 tablets (30 mg total) daily with breakfast for 1 day, THEN 1 tablet (20 mg total) daily with breakfast for 1 day, THEN 0.5 tablets (10 mg total) daily with breakfast for 1 day.  Dispense: 5 tablet; Refill: 0  2. Wheeze Expiration wheeze noted on exam  - predniSONE (DELTASONE) 20 MG tablet; Take 2 tablets (40 mg total) by mouth daily  with breakfast for 1 day, THEN 1.5 tablets (30 mg total) daily with breakfast for 1 day, THEN 1 tablet (20 mg total) daily  with breakfast for 1 day, THEN 0.5 tablets (10 mg total) daily with breakfast for 1 day.  Dispense: 5 tablet; Refill: 0  Family/ staff Communication: Reviewed plan of care with patient verbalized understanding  Labs/tests ordered: None   Next Appointment: Return if symptoms worsen or fail to improve.    Caesar Bookman, NP

## 2022-04-24 ENCOUNTER — Encounter: Payer: BC Managed Care – PPO | Admitting: Family

## 2022-04-24 NOTE — Progress Notes (Signed)
  This encounter was created in error - please disregard. No show 

## 2022-04-27 ENCOUNTER — Ambulatory Visit: Payer: BC Managed Care – PPO | Admitting: Family

## 2022-05-01 ENCOUNTER — Other Ambulatory Visit: Payer: Self-pay | Admitting: Family

## 2022-05-01 ENCOUNTER — Encounter: Payer: Self-pay | Admitting: Family

## 2022-05-01 ENCOUNTER — Ambulatory Visit (INDEPENDENT_AMBULATORY_CARE_PROVIDER_SITE_OTHER): Payer: BC Managed Care – PPO | Admitting: Family

## 2022-05-01 VITALS — BP 148/100 | HR 82 | Temp 97.8°F | Resp 16 | Ht 66.0 in | Wt 214.0 lb

## 2022-05-01 DIAGNOSIS — J4540 Moderate persistent asthma, uncomplicated: Secondary | ICD-10-CM

## 2022-05-01 DIAGNOSIS — R300 Dysuria: Secondary | ICD-10-CM

## 2022-05-01 DIAGNOSIS — K5901 Slow transit constipation: Secondary | ICD-10-CM

## 2022-05-01 DIAGNOSIS — F4310 Post-traumatic stress disorder, unspecified: Secondary | ICD-10-CM | POA: Diagnosis not present

## 2022-05-01 DIAGNOSIS — I10 Essential (primary) hypertension: Secondary | ICD-10-CM | POA: Diagnosis not present

## 2022-05-01 DIAGNOSIS — F319 Bipolar disorder, unspecified: Secondary | ICD-10-CM

## 2022-05-01 MED ORDER — LAMOTRIGINE 25 MG PO TABS: ORAL_TABLET | ORAL | 0 refills | Status: DC

## 2022-05-01 MED ORDER — ARIPIPRAZOLE 10 MG PO TABS: 10.0000 mg | ORAL_TABLET | Freq: Every day | ORAL | 0 refills | Status: DC

## 2022-05-01 MED ORDER — CLONIDINE HCL 0.1 MG PO TABS
0.1000 mg | ORAL_TABLET | Freq: Once | ORAL | Status: AC
  Administered 2022-05-01: 0.1 mg via ORAL

## 2022-05-01 MED ORDER — HYDROXYZINE PAMOATE 25 MG PO CAPS: ORAL_CAPSULE | ORAL | 1 refills | Status: DC

## 2022-05-01 MED ORDER — FLOVENT DISKUS 100 MCG/ACT IN AEPB: INHALATION_SPRAY | RESPIRATORY_TRACT | 0 refills | Status: DC

## 2022-05-01 MED ORDER — HYDRALAZINE HCL 50 MG PO TABS: 50.0000 mg | ORAL_TABLET | Freq: Three times a day (TID) | ORAL | 3 refills | Status: DC

## 2022-05-01 MED ORDER — ALBUTEROL SULFATE HFA 108 (90 BASE) MCG/ACT IN AERS: INHALATION_SPRAY | RESPIRATORY_TRACT | 3 refills | Status: DC

## 2022-05-01 MED ORDER — AMLODIPINE BESYLATE-VALSARTAN 10-320 MG PO TABS: 1.0000 | ORAL_TABLET | Freq: Every day | ORAL | 2 refills | Status: DC

## 2022-05-01 NOTE — Progress Notes (Signed)
Provider: [name] FNP-C   Bartt Gonzaga, [name], NP  Patient Care Team: Jalyric Kaestner, [name], NP as PCP - General (Family Medicine)  Extended Emergency Contact Information Primary Emergency Contact: [facility] Phone: [phone] Relation: Son Secondary Emergency Contact: [name],[name] Mobile Phone: [phone] Relation: Relative  Code Status:  Full Code  Goals of care: Advanced Directive information    01/18/2022   10:47 AM  Advanced Directives  Does Patient Have a Medical Advance Directive? No  Would patient like information on creating a medical advance directive? No - Patient declined     Chief Complaint  Patient presents with   Medical Management of Chronic Issues    Not been sleeping for a bit has been in manic state for a while. Headache today. Wants to have sugar check for DM due to family history    HPI:  Pt is a 42 y.o. female seen today for 6 months medical management of chronic diseases. Has been exercise by walking but asthma has been worst.Has run out of her inhalers.  Has not been following up with Psychiatry states not taking her insurance.Has been more manic and getting on everybody due to irritability.states taking medication.        Past Medical History:  Diagnosis Date   Anxiety    Bipolar 1 disorder    PTSD (post-traumatic stress disorder)    Past Surgical History:  Procedure Laterality Date   ABDOMINAL HYSTERECTOMY     Fibroids/heavy menses    No Known Allergies  Allergies as of 05/01/2022   No Known Allergies      Medication List        Accurate as of May 01, 2022  3:17 PM. If you have any questions, ask your nurse or doctor.          albuterol 108 (90 Base) MCG/ACT inhaler Commonly known as: VENTOLIN HFA INHALE 1-2 PUFFS BY MOUTH EVERY 6 HOURS AS NEEDED FOR WHEEZE OR SHORTNESS OF BREATH   amLODipine-valsartan 10-320 MG tablet Commonly known as: EXFORGE Take 1 tablet by mouth daily.    amoxicillin-clavulanate 875-125 MG tablet Commonly known as: AUGMENTIN Take 1 tablet by mouth every 12 (twelve) hours.   ARIPiprazole 10 MG tablet Commonly known as: ABILIFY Take 1 tablet (10 mg total) by mouth daily.   azithromycin 250 MG tablet Commonly known as: ZITHROMAX Take 1 tablet (250 mg total) by mouth daily.   benzonatate 100 MG capsule Commonly known as: TESSALON Take 1 capsule (100 mg total) by mouth 3 (three) times daily as needed for cough.   Flovent Diskus 100 MCG/ACT Aepb Generic drug: Fluticasone Propionate (Inhal) Inhale 2 puffs by mouth twice daily. Rinse out your mouth after the 2 puffs.   fluticasone 50 MCG/ACT nasal spray Commonly known as: FLONASE Place 2 sprays into both nostrils daily.   hydrALAZINE 50 MG tablet Commonly known as: APRESOLINE TAKE 1 TABLET BY MOUTH THREE TIMES A DAY   hydrOXYzine 25 MG capsule Commonly known as: Vistaril Take one capsule by mouth in the morning and two at bed time   lamoTRIgine 25 MG tablet Commonly known as: LaMICtal Take one tablet by mouth daily for one week and then 2 tab daily   senna-docusate 8.6-50 MG tablet Commonly known as: Senokot-S Take 1 tablet by mouth at bedtime.        Review of Systems  Constitutional:  Negative for appetite change, chills, fatigue, fever and unexpected weight change.  HENT:  Negative for congestion, dental problem, ear discharge,  ear pain, facial swelling, hearing loss, nosebleeds, postnasal drip, rhinorrhea, sinus pressure, sinus pain, sneezing, sore throat, tinnitus and trouble swallowing.   Eyes:  Negative for pain, discharge, redness, itching and visual disturbance.  Respiratory:  Negative for cough, chest tightness, shortness of breath and wheezing.   Cardiovascular:  Negative for chest pain, palpitations and leg swelling.  Gastrointestinal:  Negative for abdominal distention, abdominal pain, blood in stool, constipation, diarrhea, nausea and vomiting.  Endocrine:  Negative for cold intolerance, heat intolerance, polydipsia, polyphagia and polyuria.  Genitourinary:  Positive for dysuria. Negative for difficulty urinating, flank pain, frequency and urgency.  Musculoskeletal:  Negative for arthralgias, back pain, gait problem, joint swelling, myalgias, neck pain and neck stiffness.  Skin:  Negative for color change, pallor, rash and wound.  Neurological:  Negative for dizziness, syncope, speech difficulty, weakness, light-headedness, numbness and headaches.  Hematological:  Does not bruise/bleed easily.  Psychiatric/Behavioral:  Negative for agitation, behavioral problems, confusion, hallucinations, self-injury, sleep disturbance and suicidal ideas. The patient is not nervous/anxious.        Off bipolar medication has not found a provider who takes her insurance      Immunization History  Administered Date(s) Administered   Influenza, Seasonal, Injecte, Preservative Fre 11/26/2014   Influenza,inj,Quad PF,6+ Mos 11/26/2014, 12/15/2016   Tdap 02/02/2021   Pertinent  Health Maintenance Due  Topic Date Due   INFLUENZA VACCINE  08/17/2022   PAP SMEAR-Modifier  04/27/2024      08/23/2021   10:46 AM 10/21/2021   11:24 AM 01/13/2022   11:33 AM 01/18/2022   10:46 AM 05/01/2022    2:43 PM  Fall Risk  Falls in the past year? 0 0  0 0  Was there an injury with Fall? 0 0  0 0  Fall Risk Category Calculator 0 0  0 0  Fall Risk Category (Retired) Low Low  Low   (RETIRED) Patient Fall Risk Level Low fall risk Low fall risk Low fall risk Low fall risk   Patient at Risk for Falls Due to No Fall Risks No Fall Risks  No Fall Risks   Fall risk Follow up Falls evaluation completed Falls evaluation completed  Falls evaluation completed    Functional Status Survey:    Vitals:   05/01/22 1443 05/01/22 1500  BP: (!) 160/100 (!) 160/100  Pulse: 82   Resp: 16   Temp: 97.8 F (36.6 C)   TempSrc: Temporal   SpO2: 97%   Weight: 214 lb (97.1 kg)   Height: 5\' 6"   (1.676 m)    Body mass index is 34.54 kg/m. Physical Exam Vitals reviewed.  Constitutional:      General: She is not in acute distress.    Appearance: Normal appearance. She is obese. She is not ill-appearing or diaphoretic.  HENT:     Head: Normocephalic.     Right Ear: Tympanic membrane, ear canal and external ear normal. There is no impacted cerumen.     Left Ear: Tympanic membrane, ear canal and external ear normal. There is no impacted cerumen.     Nose: Nose normal. No congestion or rhinorrhea.     Mouth/Throat:     Mouth: Mucous membranes are moist.     Pharynx: Oropharynx is clear. No oropharyngeal exudate or posterior oropharyngeal erythema.  Eyes:     General: No scleral icterus.       Right eye: No discharge.        Left eye: No discharge.     Extraocular  Movements: Extraocular movements intact.     Conjunctiva/sclera: Conjunctivae normal.     Pupils: Pupils are equal, round, and reactive to light.  Neck:     Vascular: No carotid bruit.  Cardiovascular:     Rate and Rhythm: Normal rate and regular rhythm.     Pulses: Normal pulses.     Heart sounds: Normal heart sounds. No murmur heard.    No friction rub. No gallop.  Pulmonary:     Effort: Pulmonary effort is normal. No respiratory distress.     Breath sounds: Normal breath sounds. No wheezing, rhonchi or rales.  Chest:     Chest wall: No tenderness.  Abdominal:     General: Bowel sounds are normal. There is no distension.     Palpations: Abdomen is soft. There is no mass.     Tenderness: There is no abdominal tenderness. There is no right CVA tenderness, left CVA tenderness, guarding or rebound.  Musculoskeletal:        General: No swelling or tenderness. Normal range of motion.     Cervical back: Normal range of motion. No rigidity or tenderness.     Right lower leg: No edema.     Left lower leg: No edema.  Lymphadenopathy:     Cervical: No cervical adenopathy.  Skin:    General: Skin is warm and dry.      Coloration: Skin is not pale.     Findings: No bruising, erythema, lesion or rash.  Neurological:     Mental Status: She is alert and oriented to person, place, and time.     Cranial Nerves: No cranial nerve deficit.     Sensory: No sensory deficit.     Motor: No weakness.     Coordination: Coordination normal.     Gait: Gait normal.  Psychiatric:        Mood and Affect: Mood is anxious.        Speech: Speech normal.        Behavior: Behavior normal.        Thought Content: Thought content normal.        Judgment: Judgment normal.     Labs reviewed: Recent Labs    10/21/21 1156  NA 141  K 4.0  CL 106  CO2 28  GLUCOSE 89  BUN 8  CREATININE 0.84  CALCIUM 9.2   Recent Labs    10/21/21 1156  AST 13  ALT 14  BILITOT 0.3  PROT 7.0   Recent Labs    10/21/21 1156  WBC 4.5  NEUTROABS 2,390  HGB 11.9  HCT 39.3  MCV 74.4*  PLT 315   Lab Results  Component Value Date   TSH 2.16 10/21/2021   No results found for: "HGBA1C" Lab Results  Component Value Date   CHOL 187 10/21/2021   HDL 47 (L) 10/21/2021   LDLCALC 121 (H) 10/21/2021   TRIG 87 10/21/2021   CHOLHDL 4.0 10/21/2021    Significant Diagnostic Results in last 30 days:  No results found.  Assessment/Plan 1. Essential hypertension B/p elevated during visit  - amLODipine-valsartan (EXFORGE) 10-320 MG tablet; Take 1 tablet by mouth daily.  Dispense: 30 tablet; Refill: 2 - hydrALAZINE (APRESOLINE) 50 MG tablet; Take 1 tablet (50 mg total) by mouth 3 (three) times daily.  Dispense: 270 tablet; Refill: 3 - Follow up in 2 weeks to evaluate B/p  - Lipid panel; Future - TSH; Future - COMPLETE METABOLIC PANEL WITH GFR; Future - CBC with Differential/Platelet;  Future  2. Slow transit constipation Dietary modification and exercise advised.   3. Moderate persistent asthma without complication Symptoms has worsen with spring pollen  - will refill Advair and Albuterol   4. PTSD (post-traumatic stress  disorder) Continue on Abilify,Lamictal and vistaril  - ARIPiprazole (ABILIFY) 10 MG tablet; Take 1 tablet (10 mg total) by mouth daily.  Dispense: 30 tablet; Refill: 0 - lamoTRIgine (LAMICTAL) 25 MG tablet; Take one tablet by mouth daily for one week and then 2 tab daily  Dispense: 60 tablet; Refill: 0 - hydrOXYzine (VISTARIL) 25 MG capsule; Take one capsule by mouth in the morning and two at bed time  Dispense: 90 capsule; Refill: 1  5. Bipolar I disorder Has been off medication now in a manic states.will refill medication - ARIPiprazole (ABILIFY) 10 MG tablet; Take 1 tablet (10 mg total) by mouth daily.  Dispense: 30 tablet; Refill: 0 - lamoTRIgine (LAMICTAL) 25 MG tablet; Take one tablet by mouth daily for one week and then 2 tab daily  Dispense: 60 tablet; Refill: 0 - hydrOXYzine (VISTARIL) 25 MG capsule; Take one capsule by mouth in the morning and two at bed time  Dispense: 90 capsule; Refill: 1  6. Dysuria Afebrile  Will obtain urine r/o UTI  - encouraged to increase water intake  - Urine Culture  Family/ staff Communication: Reviewed plan of care with patient verbalized understanding   Labs/tests ordered:  - Urine Culture - Lipid panel; Future - TSH; Future - COMPLETE METABOLIC PANEL WITH GFR; Future - CBC with Differential/Platelet; Future  Next Appointment : Return in about 2 weeks (around 05/15/2022) for Blood pressure check and weight management., fasting labs in one week.   Caesar Bookman, NP

## 2022-05-03 LAB — URINE CULTURE
MICRO NUMBER:: 14825187
SPECIMEN QUALITY:: ADEQUATE

## 2022-05-08 ENCOUNTER — Other Ambulatory Visit: Payer: BC Managed Care – PPO

## 2022-05-08 DIAGNOSIS — J45909 Unspecified asthma, uncomplicated: Secondary | ICD-10-CM | POA: Diagnosis not present

## 2022-05-08 DIAGNOSIS — I10 Essential (primary) hypertension: Secondary | ICD-10-CM | POA: Diagnosis not present

## 2022-05-08 DIAGNOSIS — Z8709 Personal history of other diseases of the respiratory system: Secondary | ICD-10-CM | POA: Diagnosis not present

## 2022-05-08 DIAGNOSIS — Z79899 Other long term (current) drug therapy: Secondary | ICD-10-CM | POA: Diagnosis not present

## 2022-05-08 DIAGNOSIS — G44209 Tension-type headache, unspecified, not intractable: Secondary | ICD-10-CM | POA: Diagnosis not present

## 2022-05-15 ENCOUNTER — Telehealth: Payer: Self-pay | Admitting: Family

## 2022-05-15 NOTE — Telephone Encounter (Signed)
FMLA paper work [employer] as requested.

## 2022-05-16 ENCOUNTER — Ambulatory Visit: Payer: BC Managed Care – PPO | Admitting: Family

## 2022-05-17 ENCOUNTER — Ambulatory Visit (INDEPENDENT_AMBULATORY_CARE_PROVIDER_SITE_OTHER): Payer: BC Managed Care – PPO | Admitting: Adult Health

## 2022-05-17 ENCOUNTER — Encounter: Payer: Self-pay | Admitting: Adult Health

## 2022-05-17 VITALS — BP 138/79 | HR 86 | Temp 97.3°F | Resp 20 | Ht 66.0 in | Wt 214.0 lb

## 2022-05-17 DIAGNOSIS — Z6834 Body mass index (BMI) 34.0-34.9, adult: Secondary | ICD-10-CM

## 2022-05-17 DIAGNOSIS — E669 Obesity, unspecified: Secondary | ICD-10-CM

## 2022-05-17 DIAGNOSIS — F319 Bipolar disorder, unspecified: Secondary | ICD-10-CM | POA: Diagnosis not present

## 2022-05-17 DIAGNOSIS — K5901 Slow transit constipation: Secondary | ICD-10-CM

## 2022-05-17 DIAGNOSIS — I10 Essential (primary) hypertension: Secondary | ICD-10-CM | POA: Diagnosis not present

## 2022-05-17 DIAGNOSIS — J4540 Moderate persistent asthma, uncomplicated: Secondary | ICD-10-CM

## 2022-05-17 DIAGNOSIS — G4733 Obstructive sleep apnea (adult) (pediatric): Secondary | ICD-10-CM

## 2022-05-17 NOTE — Progress Notes (Signed)
[facility]  Provider:  Kenard Gower DNP  Code Status:  Full Code  Goals of Care:     01/18/2022   10:47 AM  Advanced Directives  Does Patient Have a Medical Advance Directive? No  Would patient like information on creating a medical advance directive? No - Patient declined     Chief Complaint  Patient presents with   Follow-up    2 week f/u / fasting labs     HPI: Patient is a 42 y.o. female seen today for follow up on chronic medical issues. She was a little late with her appointment today due to transportation issues and was agitated. She was brought to [facility] ED on 05/08/22 due to elevated BP of 180/100. She went to [place], [place], her hometown, due to her daughter having to her daughter being labor. She left her medications at home. She just got back to [facility] and yet to pick up medications at the pharmacy. She has not taken her BP medications for a week. She sometimes has 3-4 days of not sleeping.    Wt Readings from Last 3 Encounters:  05/17/22 214 lb (97.1 kg)  05/01/22 214 lb (97.1 kg)  01/18/22 219 lb 6.4 oz (99.5 kg)     Past Medical History:  Diagnosis Date   Anxiety    Bipolar 1 disorder (HCC)    PTSD (post-traumatic stress disorder)     Past Surgical History:  Procedure Laterality Date   ABDOMINAL HYSTERECTOMY     Fibroids/heavy menses    No Known Allergies  Outpatient Encounter Medications as of 05/17/2022  Medication Sig   albuterol (VENTOLIN HFA) 108 (90 Base) MCG/ACT inhaler INHALE 1-2 PUFFS BY MOUTH EVERY 6 HOURS AS NEEDED FOR WHEEZE OR SHORTNESS OF BREATH   amLODipine-valsartan (EXFORGE) 10-320 MG tablet Take 1 tablet by mouth daily.   amoxicillin-clavulanate (AUGMENTIN) 875-125 MG tablet Take 1 tablet by mouth every 12 (twelve) hours.   ARIPiprazole (ABILIFY) 10 MG tablet Take 1 tablet (10 mg total) by mouth daily.   azithromycin (ZITHROMAX) 250 MG tablet Take 1 tablet (250 mg total) by mouth daily.   benzonatate (TESSALON) 100 MG  capsule Take 1 capsule (100 mg total) by mouth 3 (three) times daily as needed for cough.   fluticasone (FLONASE) 50 MCG/ACT nasal spray Place 2 sprays into both nostrils daily.   hydrALAZINE (APRESOLINE) 50 MG tablet Take 1 tablet (50 mg total) by mouth 3 (three) times daily.   hydrOXYzine (VISTARIL) 25 MG capsule Take one capsule by mouth in the morning and two at bed time   lamoTRIgine (LAMICTAL) 25 MG tablet Take one tablet by mouth daily for one week and then 2 tab daily   mometasone (ASMANEX, 120 METERED DOSES,) 220 MCG/ACT inhaler Inhale 1 puff into the lungs daily.   senna-docusate (SENOKOT-S) 8.6-50 MG tablet Take 1 tablet by mouth at bedtime.   No facility-administered encounter medications on file as of 05/17/2022.    Review of Systems:  Review of Systems  Constitutional:  Negative for appetite change, chills, fatigue and fever.  HENT:  Negative for congestion, hearing loss, rhinorrhea and sore throat.   Eyes: Negative.   Respiratory:  Negative for cough, shortness of breath and wheezing.   Cardiovascular:  Negative for chest pain, palpitations and leg swelling.  Gastrointestinal:  Negative for abdominal pain, constipation, diarrhea, nausea and vomiting.  Genitourinary:  Negative for dysuria.  Musculoskeletal:  Negative for arthralgias, back pain and myalgias.  Skin:  Negative for color  change, rash and wound.  Neurological:  Negative for dizziness, weakness and headaches.  Psychiatric/Behavioral:  Positive for sleep disturbance. Negative for behavioral problems, hallucinations and self-injury. The patient is not nervous/anxious.     Health Maintenance  Topic Date Due   INFLUENZA VACCINE  08/17/2022   PAP SMEAR-Modifier  04/27/2024   DTaP/Tdap/Td (2 - Td or Tdap) 02/03/2031   Hepatitis C Screening  Completed   HIV Screening  Completed   HPV VACCINES  Aged Out   COVID-19 Vaccine  Discontinued    Physical Exam: Vitals:   05/17/22 0924  Height: 5\' 6"  (1.676 m)   Body  mass index is 34.54 kg/m. Physical Exam Constitutional:      Appearance: Normal appearance. She is obese.  HENT:     Head: Normocephalic and atraumatic.     Nose: Nose normal.     Mouth/Throat:     Mouth: Mucous membranes are moist.  Eyes:     Conjunctiva/sclera: Conjunctivae normal.  Cardiovascular:     Rate and Rhythm: Normal rate and regular rhythm.  Pulmonary:     Effort: Pulmonary effort is normal.     Breath sounds: Normal breath sounds.  Abdominal:     General: Bowel sounds are normal.     Palpations: Abdomen is soft.  Musculoskeletal:        General: Normal range of motion.     Cervical back: Normal range of motion.  Skin:    General: Skin is warm and dry.  Neurological:     General: No focal deficit present.     Mental Status: She is alert and oriented to person, place, and time.  Psychiatric:        Mood and Affect: Mood normal.        Behavior: Behavior normal.        Thought Content: Thought content normal.        Judgment: Judgment normal.     Labs reviewed: Basic Metabolic Panel: Recent Labs    10/21/21 1156  NA 141  K 4.0  CL 106  CO2 28  GLUCOSE 89  BUN 8  CREATININE 0.84  CALCIUM 9.2  TSH 2.16   Liver Function Tests: Recent Labs    10/21/21 1156  AST 13  ALT 14  BILITOT 0.3  PROT 7.0   No results for input(s): "LIPASE", "AMYLASE" in the last 8760 hours. No results for input(s): "AMMONIA" in the last 8760 hours. CBC: Recent Labs    10/21/21 1156  WBC 4.5  NEUTROABS 2,390  HGB 11.9  HCT 39.3  MCV 74.4*  PLT 315   Lipid Panel: Recent Labs    10/21/21 1156  CHOL 187  HDL 47*  LDLCALC 121*  TRIG 87  CHOLHDL 4.0   No results found for: "HGBA1C"  Procedures since last visit: No results found.  Assessment/Plan  1. Essential hypertension -  BP 138/79 -  continue Amlodipine-Valsartan and Hydralazine -  check BP daily, log and bring to next appointment  2. Bipolar I disorder (HCC) -  has periods of 3-4 days not  sleeping -  continue Abilify, Vistaril and Lamictal  3. Moderate persistent asthma without complication -  no wheezing nor SOB -  continue Flonase, mometasone and albuterol HFA PRN  4. Slow transit constipation -  continue Senna-S  5. Class 1 obesity with body mass index (BMI) of 34.0 to 34.9 in adult, unspecified obesity type, unspecified whether serious comorbidity present Body mass index is 34.54 kg/m.  -  counseled  6. OSA - in the process of obtaining CPAP machine   Labs/tests ordered:  for lipid panel, tsh, CMP and CBC  Next appt:  Visit date not found

## 2022-05-18 ENCOUNTER — Other Ambulatory Visit: Payer: Self-pay

## 2022-05-18 LAB — LIPID PANEL
Cholesterol: 175 mg/dL (ref <200)
HDL: 41 mg/dL — ABNORMAL LOW (ref >=50)
LDL Cholesterol (Calc): 112 mg/dL (calc) — ABNORMAL HIGH
Non-HDL Cholesterol (Calc): 134 mg/dL (calc) — ABNORMAL HIGH (ref <130)
Total CHOL/HDL Ratio: 4.3 (calc) (ref <5.0)
Triglycerides: 116 mg/dL (ref <150)

## 2022-05-18 LAB — CBC WITH DIFFERENTIAL/PLATELET
Absolute Monocytes: 230 cells/uL (ref 200–950)
Basophils Absolute: 42 cells/uL (ref 0–200)
Basophils Relative: 0.9 %
Eosinophils Absolute: 99 cells/uL (ref 15–500)
Eosinophils Relative: 2.1 %
HCT: 37.1 % (ref 35.0–45.0)
Hemoglobin: 11.3 g/dL — ABNORMAL LOW (ref 11.7–15.5)
Lymphs Abs: 1899 cells/uL (ref 850–3900)
MCH: 21.9 pg — ABNORMAL LOW (ref 27.0–33.0)
MCHC: 30.5 g/dL — ABNORMAL LOW (ref 32.0–36.0)
MCV: 72 fL — ABNORMAL LOW (ref 80.0–100.0)
MPV: 10 fL (ref 7.5–12.5)
Monocytes Relative: 4.9 %
Neutro Abs: 2430 cells/uL (ref 1500–7800)
Neutrophils Relative %: 51.7 %
Platelets: 306 10*3/uL (ref 140–400)
RBC: 5.15 10*6/uL — ABNORMAL HIGH (ref 3.80–5.10)
RDW: 15.5 % — ABNORMAL HIGH (ref 11.0–15.0)
Total Lymphocyte: 40.4 %
WBC: 4.7 10*3/uL (ref 3.8–10.8)

## 2022-05-18 LAB — TSH: TSH: 3.38 mIU/L

## 2022-05-18 LAB — COMPLETE METABOLIC PANEL WITH GFR
AG Ratio: 1.6 (calc) (ref 1.0–2.5)
ALT: 12 U/L (ref 6–29)
AST: 11 U/L (ref 10–30)
Albumin: 4.2 g/dL (ref 3.6–5.1)
Alkaline phosphatase (APISO): 56 U/L (ref 31–125)
BUN: 9 mg/dL (ref 7–25)
CO2: 26 mmol/L (ref 20–32)
Calcium: 9 mg/dL (ref 8.6–10.2)
Chloride: 104 mmol/L (ref 98–110)
Creat: 0.82 mg/dL (ref 0.50–0.99)
Globulin: 2.7 g/dL (calc) (ref 1.9–3.7)
Glucose, Bld: 91 mg/dL (ref 65–99)
Potassium: 3.6 mmol/L (ref 3.5–5.3)
Sodium: 138 mmol/L (ref 135–146)
Total Bilirubin: 0.4 mg/dL (ref 0.2–1.2)
Total Protein: 6.9 g/dL (ref 6.1–8.1)
eGFR: 91 mL/min/{1.73_m2} (ref >=60)

## 2022-05-18 MED ORDER — MULTIVITAMINS PO CAPS: 1.0000 | ORAL_CAPSULE | Freq: Every day | ORAL | 3 refills | Status: DC

## 2022-09-14 ENCOUNTER — Encounter: Payer: Self-pay | Admitting: Orthopedic Surgery

## 2022-09-14 ENCOUNTER — Ambulatory Visit (INDEPENDENT_AMBULATORY_CARE_PROVIDER_SITE_OTHER): Payer: BC Managed Care – PPO | Admitting: Orthopedic Surgery

## 2022-09-14 VITALS — BP 142/88 | HR 88 | Temp 97.2°F | Resp 17 | Ht 66.0 in | Wt 215.6 lb

## 2022-09-14 DIAGNOSIS — I10 Essential (primary) hypertension: Secondary | ICD-10-CM

## 2022-09-14 DIAGNOSIS — J4541 Moderate persistent asthma with (acute) exacerbation: Secondary | ICD-10-CM

## 2022-09-14 MED ORDER — MONTELUKAST SODIUM 10 MG PO TABS: 10.0000 mg | ORAL_TABLET | Freq: Every day | ORAL | 1 refills | Status: DC

## 2022-09-14 MED ORDER — ASMANEX (120 METERED DOSES) 220 MCG/ACT IN AEPB: 1.0000 | INHALATION_SPRAY | Freq: Every day | RESPIRATORY_TRACT | 1 refills | Status: DC

## 2022-09-14 MED ORDER — PREDNISONE 20 MG PO TABS: ORAL_TABLET | ORAL | 0 refills | Status: AC

## 2022-09-14 MED ORDER — FLUTICASONE PROPIONATE 50 MCG/ACT NA SUSP: 2.0000 | Freq: Every day | NASAL | 2 refills | Status: DC

## 2022-09-14 NOTE — Patient Instructions (Signed)
Prednisone started to help with asthma exacerbation> start as soon as you get it  Singulair helps with allergies related with asthma> please take daily  If breathing worsens> report to ED  Take all blood pressure medications  Purchase blood pressure cuff on [employer]> if they continue to trend > 140/90- please let us know

## 2022-09-14 NOTE — Progress Notes (Signed)
Careteam: Patient Care Team: Ngetich, [name], NP as PCP - General (Family Medicine)  Seen by: Hazle Nordmann, AGNP-C  PLACE OF SERVICE:  [facility]  Advanced Directive information Does Patient Have a Medical Advance Directive?: No, Would patient like information on creating a medical advance directive?: No - Patient declined  No Known Allergies  Chief Complaint  Patient presents with   Acute Visit    Patient complains of coughing and wheezing.      HPI: Patient is a 42 y.o. female seen today for ongoing cough and wheezing.   H/o asthma. She has has increased wheezing and coughing x 2 days. Coughing is keeping her up at night. She ran out of her Asmanex inhaler. She has been using albuterol inhaler without success. Denies cold symptoms. She is not taking antihistamine. Afebrile. Vitals stable.   H/o HTN. She has not been taking amlodipine- valsartan for a few days. She did take hydralazine this morning. Denies chest pain and headache.   Review of Systems:  Review of Systems  Constitutional:  Positive for malaise/fatigue. Negative for fever.  HENT:  Positive for congestion. Negative for sore throat.   Respiratory:  Positive for cough, sputum production, shortness of breath and wheezing.   Cardiovascular:  Negative for chest pain and leg swelling.  Gastrointestinal:  Negative for nausea and vomiting.  Musculoskeletal:  Negative for myalgias.  Neurological:  Negative for dizziness and headaches.  Endo/Heme/Allergies:  Positive for environmental allergies.  Psychiatric/Behavioral:  Negative for depression. The patient is not nervous/anxious.     Past Medical History:  Diagnosis Date   Anxiety    Bipolar 1 disorder (HCC)    PTSD (post-traumatic stress disorder)    Past Surgical History:  Procedure Laterality Date   ABDOMINAL HYSTERECTOMY     Fibroids/heavy menses   Social History:   reports that she has never smoked. She has never used smokeless tobacco. She reports  current alcohol use. She reports that she does not use drugs.  Family History  Problem Relation Age of Onset   Diabetes Father    Cirrhosis Father     Medications: Patient's Medications  New Prescriptions   No medications on file  Previous Medications   ALBUTEROL (VENTOLIN HFA) 108 (90 BASE) MCG/ACT INHALER    INHALE 1-2 PUFFS BY MOUTH EVERY 6 HOURS AS NEEDED FOR WHEEZE OR SHORTNESS OF BREATH   AMLODIPINE-VALSARTAN (EXFORGE) 10-320 MG TABLET    Take 1 tablet by mouth daily.   ARIPIPRAZOLE (ABILIFY) 10 MG TABLET    Take 1 tablet (10 mg total) by mouth daily.   FLUTICASONE (FLONASE) 50 MCG/ACT NASAL SPRAY    Place 2 sprays into both nostrils daily.   HYDRALAZINE (APRESOLINE) 50 MG TABLET    Take 1 tablet (50 mg total) by mouth 3 (three) times daily.   HYDROXYZINE (VISTARIL) 25 MG CAPSULE    Take one capsule by mouth in the morning and two at bed time   LAMOTRIGINE (LAMICTAL) 25 MG TABLET    Take one tablet by mouth daily for one week and then 2 tab daily   MOMETASONE (ASMANEX, 120 METERED DOSES,) 220 MCG/ACT INHALER    Inhale 1 puff into the lungs daily.   MULTIPLE VITAMIN (MULTIVITAMIN) CAPSULE    Take 1 capsule by mouth daily.   SENNA-DOCUSATE (SENOKOT-S) 8.6-50 MG TABLET    Take 1 tablet by mouth at bedtime.  Modified Medications   No medications on file  Discontinued Medications   AMOXICILLIN-CLAVULANATE (AUGMENTIN) 875-125  MG TABLET    Take 1 tablet by mouth every 12 (twelve) hours.   BENZONATATE (TESSALON) 100 MG CAPSULE    Take 1 capsule (100 mg total) by mouth 3 (three) times daily as needed for cough.    Physical Exam:  Vitals:   09/14/22 1114 09/14/22 1115  BP: (!) 148/110 (!) 160/112  Pulse: (!) 107   Resp: 17   Temp: (!) 97.2 F (36.2 C)   SpO2: 96%   Weight: 215 lb 9.6 oz (97.8 kg)   Height: 5\' 6"  (1.676 m)    Body mass index is 34.8 kg/m. Wt Readings from Last 3 Encounters:  09/14/22 215 lb 9.6 oz (97.8 kg)  05/17/22 214 lb (97.1 kg)  05/01/22 214 lb (97.1  kg)    Physical Exam Vitals reviewed.  Constitutional:      Appearance: She is obese.  HENT:     Head: Normocephalic.  Eyes:     General:        Right eye: No discharge.        Left eye: No discharge.  Cardiovascular:     Rate and Rhythm: Normal rate and regular rhythm.     Pulses: Normal pulses.     Heart sounds: Normal heart sounds.  Pulmonary:     Effort: Pulmonary effort is normal. No respiratory distress.     Breath sounds: Examination of the right-upper field reveals wheezing. Examination of the left-upper field reveals wheezing. Examination of the right-middle field reveals wheezing. Examination of the left-middle field reveals wheezing. Wheezing present. No rhonchi or rales.     Comments: Expiratory/inspiratory Abdominal:     General: Bowel sounds are normal.     Palpations: Abdomen is soft.  Musculoskeletal:     Cervical back: Neck supple.     Right lower leg: No edema.     Left lower leg: No edema.  Skin:    General: Skin is warm.     Capillary Refill: Capillary refill takes less than 2 seconds.  Neurological:     General: No focal deficit present.     Mental Status: She is alert and oriented to person, place, and time.  Psychiatric:        Mood and Affect: Mood normal.     Labs reviewed: Basic Metabolic Panel: Recent Labs    10/21/21 1156 05/17/22 0958  NA 141 138  K 4.0 3.6  CL 106 104  CO2 28 26  GLUCOSE 89 91  BUN 8 9  CREATININE 0.84 0.82  CALCIUM 9.2 9.0  TSH 2.16 3.38   Liver Function Tests: Recent Labs    10/21/21 1156 05/17/22 0958  AST 13 11  ALT 14 12  BILITOT 0.3 0.4  PROT 7.0 6.9   No results for input(s): "LIPASE", "AMYLASE" in the last 8760 hours. No results for input(s): "AMMONIA" in the last 8760 hours. CBC: Recent Labs    10/21/21 1156 05/17/22 0958  WBC 4.5 4.7  NEUTROABS 2,390 2,430  HGB 11.9 11.3*  HCT 39.3 37.1  MCV 74.4* 72.0*  PLT 315 306   Lipid Panel: Recent Labs    10/21/21 1156 05/17/22 0958   CHOL 187 175  HDL 47* 41*  LDLCALC 121* 259*  TRIG 87 116  CHOLHDL 4.0 4.3   TSH: Recent Labs    10/21/21 1156 05/17/22 0958  TSH 2.16 3.38   A1C: No results found for: "HGBA1C"   Assessment/Plan 1. Moderate persistent asthma with exacerbation - onset x 2 days - expiratory/inspiratory  wheezing to upper and middle lobes - she ran out of inhaler - start prednisone for exacerbation - start Singulair - discussed medication compliance - if breathing worsens> report to ED  - fluticasone (FLONASE) 50 MCG/ACT nasal spray; Place 2 sprays into both nostrils daily.  Dispense: 11.1 mL; Refill: 2 - mometasone (ASMANEX, 120 METERED DOSES,) 220 MCG/ACT inhaler; Inhale 1 puff into the lungs daily.  Dispense: 6 each; Refill: 1 - predniSONE (DELTASONE) 20 MG tablet; Take 2 tablets (40 mg total) by mouth daily with breakfast for 4 days, THEN 1 tablet (20 mg total) daily with breakfast for 3 days.  Dispense: 11 tablet; Refill: 0 - montelukast (SINGULAIR) 10 MG tablet; Take 1 tablet (10 mg total) by mouth at bedtime.  Dispense: 90 tablet; Refill: 1  2. Essential hypertension - see above - uncontrolled - had not been taking amlodipine- valsartan - advised to restart amlodipine-valsartan - cont hydralazine - discussed home blood pressures and medication compliance - if bp trends > 140/90 notify PCP  Total time: 31 minutes. Greater than 50% of total time spent doing patient education regarding asthma exacerbation and HTN including symptom/medication management.     Next appt: Visit date not found  Parris Signer Scherry Ran  [facility] [phone]

## 2022-09-28 ENCOUNTER — Ambulatory Visit (INDEPENDENT_AMBULATORY_CARE_PROVIDER_SITE_OTHER): Payer: BC Managed Care – PPO | Admitting: Orthopedic Surgery

## 2022-09-28 ENCOUNTER — Ambulatory Visit
Admission: RE | Admit: 2022-09-28 | Discharge: 2022-09-28 | Disposition: A | Discharge status: Home / Self Care | Payer: BC Managed Care – PPO | Source: Ambulatory Visit | Attending: Orthopedic Surgery | Admitting: Orthopedic Surgery

## 2022-09-28 ENCOUNTER — Encounter: Payer: Self-pay | Admitting: Orthopedic Surgery

## 2022-09-28 VITALS — BP 151/90 | HR 98 | Temp 96.9°F | Resp 17 | Ht 66.0 in | Wt 216.6 lb

## 2022-09-28 DIAGNOSIS — R062 Wheezing: Secondary | ICD-10-CM | POA: Diagnosis not present

## 2022-09-28 DIAGNOSIS — R030 Elevated blood-pressure reading, without diagnosis of hypertension: Secondary | ICD-10-CM | POA: Diagnosis not present

## 2022-09-28 DIAGNOSIS — R059 Cough, unspecified: Secondary | ICD-10-CM | POA: Diagnosis not present

## 2022-09-28 MED ORDER — PREDNISONE 20 MG PO TABS: 40.0000 mg | ORAL_TABLET | Freq: Every day | ORAL | 0 refills | Status: AC

## 2022-09-28 MED ORDER — FLUTICASONE-SALMETEROL 100-50 MCG/ACT IN AEPB: 1.0000 | INHALATION_SPRAY | Freq: Two times a day (BID) | RESPIRATORY_TRACT | 3 refills | Status: DC
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
## 2022-09-28 NOTE — Patient Instructions (Signed)
[facility]> [address]> go to have chest xray  Please take prednisone every morning  Stop taking old inhaler> replaced with Adviar  If breathing worsens > go to Emergency Room   Continue Singulair  Replace filters in apartment  Consider getting air purifier

## 2022-10-12 DIAGNOSIS — J4531 Mild persistent asthma with (acute) exacerbation: Secondary | ICD-10-CM | POA: Diagnosis not present

## 2022-10-12 DIAGNOSIS — Z7951 Long term (current) use of inhaled steroids: Secondary | ICD-10-CM | POA: Diagnosis not present

## 2022-10-12 DIAGNOSIS — R062 Wheezing: Secondary | ICD-10-CM | POA: Diagnosis not present

## 2022-10-12 DIAGNOSIS — R059 Cough, unspecified: Secondary | ICD-10-CM | POA: Diagnosis not present

## 2022-10-12 DIAGNOSIS — F319 Bipolar disorder, unspecified: Secondary | ICD-10-CM | POA: Diagnosis not present

## 2022-10-12 DIAGNOSIS — I10 Essential (primary) hypertension: Secondary | ICD-10-CM | POA: Diagnosis not present

## 2022-10-25 ENCOUNTER — Encounter: Payer: Self-pay | Admitting: Pulmonary Disease

## 2022-10-25 ENCOUNTER — Institutional Professional Consult (permissible substitution): Payer: BC Managed Care – PPO | Admitting: Pulmonary Disease

## 2022-10-31 ENCOUNTER — Ambulatory Visit (INDEPENDENT_AMBULATORY_CARE_PROVIDER_SITE_OTHER): Payer: BC Managed Care – PPO | Admitting: Family

## 2022-10-31 ENCOUNTER — Encounter: Payer: Self-pay | Admitting: Family

## 2022-10-31 VITALS — BP 150/90 | HR 90 | Temp 97.6°F | Ht 66.0 in | Wt 215.6 lb

## 2022-10-31 DIAGNOSIS — R300 Dysuria: Secondary | ICD-10-CM

## 2022-10-31 DIAGNOSIS — I10 Essential (primary) hypertension: Secondary | ICD-10-CM

## 2022-10-31 DIAGNOSIS — F319 Bipolar disorder, unspecified: Secondary | ICD-10-CM | POA: Diagnosis not present

## 2022-10-31 LAB — POCT URINALYSIS DIPSTICK
Bilirubin, UA: NEGATIVE
Blood, UA: NEGATIVE
Glucose, UA: NEGATIVE
Ketones, UA: NEGATIVE
Leukocytes, UA: NEGATIVE
Nitrite, UA: NEGATIVE
Protein, UA: NEGATIVE
Spec Grav, UA: 1.015 (ref 1.010–1.025)
Urobilinogen, UA: 0.2 U/dL
pH, UA: 6.5 (ref 5.0–8.0)

## 2022-10-31 MED ORDER — CLONIDINE HCL 0.1 MG PO TABS
0.1000 mg | ORAL_TABLET | Freq: Once | ORAL | Status: AC
  Administered 2022-10-31: 0.1 mg via ORAL

## 2022-10-31 NOTE — Progress Notes (Signed)
Provider: [name] FNP-C  Orlando Devereux, [name], NP  Patient Care Team: Delana Manganello, [name], NP as PCP - General (Family Medicine)  Extended Emergency Contact Information Primary Emergency Contact: [facility] Phone: [phone] Relation: Son Secondary Emergency Contact: [name],[name] Mobile Phone: [phone] Relation: Relative  Code Status: Full code Goals of care: Advanced Directive information    09/28/2022    9:23 AM  Advanced Directives  Does Patient Have a Medical Advance Directive? No  Would patient like information on creating a medical advance directive? No - Patient declined     Chief Complaint  Patient presents with   Medical Management of Chronic Issues    Patient presents today for a 6 month follow-up   Acute Visit    Complete FMLA paperwork     HPI:  Pt is a 42 y.o. female seen today for an acute visit to complete FMLA and paperwork. Blood pressure elevated  states took medication at 2-3 am this morning.states had nose bleed 1:30 am had another episode last week.states does not have a blood pressure cuff  machine. States still having wheezing from asthma.request refill on her inhaler.states had a water leak in her apartment several months ago thinks might have moles. Has had shortness of breath.She denies any fever,chills,cough,fatigue,body aches,runny nose,chest tightness,chest pain or palpitation.   States had an appointment with pulmonologist but forgot since she has been manic episode. Has been under stress. She was laid out but went back 10/23/2022.  States has not been seen by Psychiatrist.states missed several appointment though states her calls were coming up as spasm so did not answer.    Past Medical History:  Diagnosis Date   Anxiety    Bipolar 1 disorder (HCC)    PTSD (post-traumatic stress disorder)    Past Surgical History:  Procedure Laterality Date   ABDOMINAL HYSTERECTOMY     Fibroids/heavy menses    No Known  Allergies  Outpatient Encounter Medications as of 10/31/2022  Medication Sig   albuterol (VENTOLIN HFA) 108 (90 Base) MCG/ACT inhaler INHALE 1-2 PUFFS BY MOUTH EVERY 6 HOURS AS NEEDED FOR WHEEZE OR SHORTNESS OF BREATH   amLODipine-valsartan (EXFORGE) 10-320 MG tablet Take 1 tablet by mouth daily.   ARIPiprazole (ABILIFY) 10 MG tablet Take 1 tablet (10 mg total) by mouth daily.   fluticasone (FLONASE) 50 MCG/ACT nasal spray Place 2 sprays into both nostrils daily.   fluticasone-salmeterol (ADVAIR) 100-50 MCG/ACT AEPB Inhale 1 puff into the lungs 2 (two) times daily.   hydrALAZINE (APRESOLINE) 50 MG tablet Take 1 tablet (50 mg total) by mouth 3 (three) times daily.   hydrOXYzine (VISTARIL) 25 MG capsule Take one capsule by mouth in the morning and two at bed time   lamoTRIgine (LAMICTAL) 25 MG tablet Take one tablet by mouth daily for one week and then 2 tab daily   montelukast (SINGULAIR) 10 MG tablet Take 1 tablet (10 mg total) by mouth at bedtime.   Multiple Vitamin (MULTIVITAMIN) capsule Take 1 capsule by mouth daily.   senna-docusate (SENOKOT-S) 8.6-50 MG tablet Take 1 tablet by mouth at bedtime.   [EXPIRED] cloNIDine (CATAPRES) tablet 0.1 mg    No facility-administered encounter medications on file as of 10/31/2022.    Review of Systems  Constitutional:  Negative for appetite change, chills, fatigue, fever and unexpected weight change.  HENT:  Negative for congestion, dental problem, ear discharge, ear pain, facial swelling, hearing loss, nosebleeds, postnasal drip, rhinorrhea, sinus pressure, sinus pain, sneezing, sore throat, tinnitus and trouble swallowing.  Eyes:  Negative for pain, discharge, redness, itching and visual disturbance.  Respiratory:  Negative for cough, chest tightness, shortness of breath and wheezing.   Cardiovascular:  Negative for chest pain, palpitations and leg swelling.  Gastrointestinal:  Negative for abdominal distention, abdominal pain, blood in stool,  constipation, diarrhea, nausea and vomiting.  Endocrine: Negative for cold intolerance, heat intolerance, polydipsia, polyphagia and polyuria.  Genitourinary:  Negative for difficulty urinating, dysuria, flank pain, frequency and urgency.  Musculoskeletal:  Negative for arthralgias, back pain, gait problem, joint swelling, myalgias, neck pain and neck stiffness.  Skin:  Negative for color change, pallor, rash and wound.  Neurological:  Negative for dizziness, syncope, speech difficulty, weakness, light-headedness, numbness and headaches.  Hematological:  Does not bruise/bleed easily.  Psychiatric/Behavioral:  Negative for agitation, behavioral problems, confusion, hallucinations, self-injury, sleep disturbance and suicidal ideas. The patient is not nervous/anxious.        Increased stress level     Immunization History  Administered Date(s) Administered   Influenza, Seasonal, Injecte, Preservative Fre 11/26/2014   Influenza,inj,Quad PF,6+ Mos 11/26/2014, 12/15/2016   Tdap 02/02/2021   Pertinent  Health Maintenance Due  Topic Date Due   INFLUENZA VACCINE  08/17/2022      01/18/2022   10:46 AM 05/01/2022    2:43 PM 05/17/2022    9:35 AM 09/14/2022   11:17 AM 09/28/2022    9:23 AM  Fall Risk  Falls in the past year? 0 0 0 0 0  Was there an injury with Fall? 0 0 0 0 0  Fall Risk Category Calculator 0 0 0 0 0  Fall Risk Category (Retired) Low      (RETIRED) Patient Fall Risk Level Low fall risk      Patient at Risk for Falls Due to No Fall Risks  No Fall Risks No Fall Risks No Fall Risks  Fall risk Follow up Falls evaluation completed  Falls evaluation completed Falls evaluation completed;Education provided;Falls prevention discussed Falls evaluation completed;Education provided;Falls prevention discussed   Functional Status Survey:    Vitals:   10/31/22 0944 10/31/22 0949  BP: (!) 170/100 (!) 150/90  Pulse: 90   Temp: 97.6 F (36.4 C)   SpO2: 98%   Weight: 215 lb 9.6 oz (97.8 kg)    Height: 5\' 6"  (1.676 m)    Body mass index is 34.8 kg/m. Physical Exam Vitals reviewed.  Constitutional:      General: She is not in acute distress.    Appearance: Normal appearance. She is obese. She is not ill-appearing or diaphoretic.  HENT:     Head: Normocephalic.     Mouth/Throat:     Mouth: Mucous membranes are moist.     Pharynx: Oropharynx is clear. No oropharyngeal exudate or posterior oropharyngeal erythema.  Eyes:     General: No scleral icterus.       Right eye: No discharge.        Left eye: No discharge.     Conjunctiva/sclera: Conjunctivae normal.     Pupils: Pupils are equal, round, and reactive to light.  Neck:     Vascular: No carotid bruit.  Cardiovascular:     Rate and Rhythm: Normal rate and regular rhythm.     Pulses: Normal pulses.     Heart sounds: Normal heart sounds. No murmur heard.    No friction rub. No gallop.  Pulmonary:     Effort: Pulmonary effort is normal. No respiratory distress.     Breath sounds: Normal breath sounds. No  wheezing, rhonchi or rales.  Chest:     Chest wall: No tenderness.  Abdominal:     General: Bowel sounds are normal. There is no distension.     Palpations: Abdomen is soft. There is no mass.     Tenderness: There is no abdominal tenderness. There is no right CVA tenderness, left CVA tenderness, guarding or rebound.  Musculoskeletal:        General: No swelling or tenderness. Normal range of motion.     Cervical back: Normal range of motion. No rigidity or tenderness.     Right lower leg: No edema.     Left lower leg: No edema.  Lymphadenopathy:     Cervical: No cervical adenopathy.  Skin:    General: Skin is warm and dry.     Coloration: Skin is not pale.     Findings: No bruising, erythema, lesion or rash.  Neurological:     Mental Status: She is alert and oriented to person, place, and time.     Cranial Nerves: No cranial nerve deficit.     Sensory: No sensory deficit.     Motor: No weakness.      Coordination: Coordination normal.     Gait: Gait normal.  Psychiatric:        Mood and Affect: Mood is depressed.        Speech: Speech normal.        Behavior: Behavior normal.        Thought Content: Thought content normal.        Judgment: Judgment normal.     Labs reviewed: Recent Labs    05/17/22 0958  NA 138  K 3.6  CL 104  CO2 26  GLUCOSE 91  BUN 9  CREATININE 0.82  CALCIUM 9.0   Recent Labs    05/17/22 0958  AST 11  ALT 12  BILITOT 0.4  PROT 6.9   Recent Labs    05/17/22 0958  WBC 4.7  NEUTROABS 2,430  HGB 11.3*  HCT 37.1  MCV 72.0*  PLT 306   Lab Results  Component Value Date   TSH 3.38 05/17/2022   No results found for: "HGBA1C" Lab Results  Component Value Date   CHOL 175 05/17/2022   HDL 41 (L) 05/17/2022   LDLCALC 112 (H) 05/17/2022   TRIG 116 05/17/2022   CHOLHDL 4.3 05/17/2022    Significant Diagnostic Results in last 30 days:  No results found.  Assessment/Plan  1. Uncontrolled hypertension Elevated this visit took med at 2-3 am  - clonidine administered  - increase Hydralazine to 50 mg three times daily  - continue on Amlodipine- valsartan - Ambulatory referral to Cardiology - cloNIDine (CATAPRES) tablet 0.1 mg  2. Bipolar I disorder [facility]) Previously referred to psychiatry but missed several appointment though states her calls were coming up as spasm so did not answer.  - Ambulatory referral to Psychiatry  3. Dysuria Afebrile  - POCT urinalysis dipstick negative  - encouraged to increase water intake   Family/ staff Communication: Reviewed plan of care with patient verbalized understanding   Labs/tests ordered:  - POCT urinalysis dipstick  Next Appointment: Return in about 6 months (around 05/01/2023) for medical mangement of chronic issues., Fasting labs in 6 months prior to visit.   Caesar Bookman, NP

## 2023-01-05 ENCOUNTER — Ambulatory Visit: Payer: BC Managed Care – PPO | Admitting: Cardiology

## 2023-01-25 ENCOUNTER — Other Ambulatory Visit: Payer: Self-pay

## 2023-01-25 ENCOUNTER — Emergency Department (HOSPITAL_BASED_OUTPATIENT_CLINIC_OR_DEPARTMENT_OTHER)
Admission: EM | Admit: 2023-01-25 | Discharge: 2023-01-25 | Disposition: A | Discharge status: Home / Self Care | Payer: BC Managed Care – PPO | Attending: Emergency Medicine | Admitting: Emergency Medicine

## 2023-01-25 ENCOUNTER — Encounter (HOSPITAL_BASED_OUTPATIENT_CLINIC_OR_DEPARTMENT_OTHER): Payer: Self-pay

## 2023-01-25 ENCOUNTER — Emergency Department (HOSPITAL_BASED_OUTPATIENT_CLINIC_OR_DEPARTMENT_OTHER): Payer: BC Managed Care – PPO

## 2023-01-25 DIAGNOSIS — R059 Cough, unspecified: Secondary | ICD-10-CM | POA: Diagnosis not present

## 2023-01-25 DIAGNOSIS — J4541 Moderate persistent asthma with (acute) exacerbation: Secondary | ICD-10-CM | POA: Insufficient documentation

## 2023-01-25 DIAGNOSIS — Z7952 Long term (current) use of systemic steroids: Secondary | ICD-10-CM | POA: Diagnosis not present

## 2023-01-25 DIAGNOSIS — E876 Hypokalemia: Secondary | ICD-10-CM | POA: Diagnosis not present

## 2023-01-25 DIAGNOSIS — Z7951 Long term (current) use of inhaled steroids: Secondary | ICD-10-CM | POA: Insufficient documentation

## 2023-01-25 DIAGNOSIS — Z20822 Contact with and (suspected) exposure to covid-19: Secondary | ICD-10-CM | POA: Diagnosis not present

## 2023-01-25 DIAGNOSIS — R0602 Shortness of breath: Secondary | ICD-10-CM | POA: Diagnosis not present

## 2023-01-25 DIAGNOSIS — R062 Wheezing: Secondary | ICD-10-CM | POA: Diagnosis not present

## 2023-01-25 LAB — BASIC METABOLIC PANEL
Anion gap: 6 (ref 5–15)
BUN: 9 mg/dL (ref 6–20)
CO2: 26 mmol/L (ref 22–32)
Calcium: 8.8 mg/dL — ABNORMAL LOW (ref 8.9–10.3)
Chloride: 106 mmol/L (ref 98–111)
Creatinine, Ser: 0.79 mg/dL (ref 0.44–1.00)
GFR, Estimated: >60 mL/min (ref >60)
Glucose, Bld: 134 mg/dL — ABNORMAL HIGH (ref 70–99)
Potassium: 3.4 mmol/L — ABNORMAL LOW (ref 3.5–5.1)
Sodium: 138 mmol/L (ref 135–145)

## 2023-01-25 LAB — RESP PANEL BY RT-PCR (RSV, FLU A&B, COVID)  RVPGX2
Influenza A by PCR: NEGATIVE
Influenza B by PCR: NEGATIVE
Resp Syncytial Virus by PCR: NEGATIVE
SARS Coronavirus 2 by RT PCR: NEGATIVE

## 2023-01-25 LAB — CBC
HCT: 38 % (ref 36.0–46.0)
Hemoglobin: 11.3 g/dL — ABNORMAL LOW (ref 12.0–15.0)
MCH: 21.3 pg — ABNORMAL LOW (ref 26.0–34.0)
MCHC: 29.7 g/dL — ABNORMAL LOW (ref 30.0–36.0)
MCV: 71.6 fL — ABNORMAL LOW (ref 80.0–100.0)
Platelets: 295 10*3/uL (ref 150–400)
RBC: 5.31 MIL/uL — ABNORMAL HIGH (ref 3.87–5.11)
RDW: 16.9 % — ABNORMAL HIGH (ref 11.5–15.5)
WBC: 4.9 10*3/uL (ref 4.0–10.5)
nRBC: 0 % (ref 0.0–0.2)

## 2023-01-25 MED ORDER — ALBUTEROL SULFATE HFA 108 (90 BASE) MCG/ACT IN AERS
INHALATION_SPRAY | RESPIRATORY_TRACT | Status: AC
  Administered 2023-01-25: 2
  Filled 2023-01-25: qty 6.7

## 2023-01-25 MED ORDER — IPRATROPIUM-ALBUTEROL 0.5-2.5 (3) MG/3ML IN SOLN
3.0000 mL | Freq: Once | RESPIRATORY_TRACT | Status: AC
  Administered 2023-01-25: 3 mL via RESPIRATORY_TRACT
  Filled 2023-01-25: qty 3

## 2023-01-25 MED ORDER — ALBUTEROL SULFATE HFA 108 (90 BASE) MCG/ACT IN AERS: INHALATION_SPRAY | RESPIRATORY_TRACT | 3 refills | Status: DC

## 2023-01-25 MED ORDER — PREDNISONE 20 MG PO TABS: 40.0000 mg | ORAL_TABLET | Freq: Every day | ORAL | 0 refills | Status: DC

## 2023-01-25 NOTE — ED Notes (Signed)
 Patient assessed in triage. Stated she has asthma and is out of MDI. BBS clear but positive for croupy cough. MDI given in triage.

## 2023-01-25 NOTE — ED Provider Notes (Signed)
 [place] [facility]GENCY DEPARTMENT AT [facility] Provider Note   CSN: [id number] Arrival date & time: 01/25/23  1503     History  Chief Complaint  Patient presents with   Shortness of Breath    Deanna Powell is a 43 y.o. female with past medical history significant for asthma, bipolar disorder, PTSD, anxiety who presents concern for wheezing, coughing since this weekend.  Patient with history of asthma.  She reports that she is out of her home inhalers and so has not been taking anything.  She denies any chest pain or significant shortness of breath.  She reports a dry wheezing cough.   Shortness of Breath      Home Medications Prior to Admission medications   Medication Sig Start Date End Date Taking? Authorizing Provider  predniSONE  (DELTASONE ) 20 MG tablet Take 2 tablets (40 mg total) by mouth daily. 01/25/23  Yes Jerrid Forgette H, PA-C  albuterol  (VENTOLIN  HFA) 108 (90 Base) MCG/ACT inhaler INHALE 1-2 PUFFS BY MOUTH EVERY 6 HOURS AS NEEDED FOR WHEEZE OR SHORTNESS OF BREATH 01/25/23   Tayva Easterday H, PA-C  amLODipine -valsartan  (EXFORGE ) 10-320 MG tablet Take 1 tablet by mouth daily. 05/01/22   Ngetich, Dinah C, NP  ARIPiprazole  (ABILIFY ) 10 MG tablet Take 1 tablet (10 mg total) by mouth daily. 05/01/22   Ngetich, Dinah C, NP  fluticasone  (FLONASE ) 50 MCG/ACT nasal spray Place 2 sprays into both nostrils daily. 09/14/22   Fargo, Amy E, NP  fluticasone -salmeterol (ADVAIR) 100-50 MCG/ACT AEPB Inhale 1 puff into the lungs 2 (two) times daily. 09/28/22   Fargo, Amy E, NP  hydrALAZINE  (APRESOLINE ) 50 MG tablet Take 1 tablet (50 mg total) by mouth 3 (three) times daily. 05/01/22   Ngetich, Dinah C, NP  hydrOXYzine  (VISTARIL ) 25 MG capsule Take one capsule by mouth in the morning and two at bed time 05/01/22   Ngetich, Dinah C, NP  lamoTRIgine  (LAMICTAL ) 25 MG tablet Take one tablet by mouth daily for one week and then 2 tab daily 05/01/22   Ngetich, Dinah C, NP  montelukast   (SINGULAIR ) 10 MG tablet Take 1 tablet (10 mg total) by mouth at bedtime. 09/14/22   Gil Greig BRAVO, NP  Multiple Vitamin (MULTIVITAMIN) capsule Take 1 capsule by mouth daily. 05/18/22   Ngetich, Dinah C, NP  senna-docusate (SENOKOT-S) 8.6-50 MG tablet Take 1 tablet by mouth at bedtime. 05/23/21   Ngetich, Dinah C, NP      Allergies    Patient has no known allergies.    Review of Systems   Review of Systems  Respiratory:  Positive for shortness of breath.   All other systems reviewed and are negative.   Physical Exam Updated Vital Signs BP (!) 182/113 (BP Location: Right Arm)   Pulse 95   Temp 98.2 F (36.8 C) (Oral)   Resp 20   SpO2 98%  Physical Exam Vitals and nursing note reviewed.  Constitutional:      General: She is not in acute distress.    Appearance: Normal appearance.  HENT:     Head: Normocephalic and atraumatic.  Eyes:     General:        Right eye: No discharge.        Left eye: No discharge.  Cardiovascular:     Rate and Rhythm: Normal rate and regular rhythm.     Heart sounds: No murmur heard.    No friction rub. No gallop.  Pulmonary:     Effort: Pulmonary effort is  normal.     Breath sounds: Normal breath sounds.     Comments: Patient with some upper respiratory wheezing, and dry area cough.  She has no significant lower respiratory wheezing, respiratory distress, increased work of breathing, or focal consolidation noted. Abdominal:     General: Bowel sounds are normal.     Palpations: Abdomen is soft.  Skin:    General: Skin is warm and dry.     Capillary Refill: Capillary refill takes less than 2 seconds.  Neurological:     Mental Status: She is alert and oriented to person, place, and time.  Psychiatric:        Mood and Affect: Mood normal.        Behavior: Behavior normal.     ED Results / Procedures / Treatments   Labs (all labs ordered are listed, but only abnormal results are displayed) Labs Reviewed  CBC - Abnormal; Notable for the  following components:      Result Value   RBC 5.31 (*)    Hemoglobin 11.3 (*)    MCV 71.6 (*)    MCH 21.3 (*)    MCHC 29.7 (*)    RDW 16.9 (*)    All other components within normal limits  BASIC METABOLIC PANEL - Abnormal; Notable for the following components:   Potassium 3.4 (*)    Glucose, Bld 134 (*)    Calcium 8.8 (*)    All other components within normal limits  RESP PANEL BY RT-PCR (RSV, FLU A&B, COVID)  [id number]    EKG None  Radiology DG Chest 2 View Result Date: 01/25/2023 CLINICAL DATA:  Wheezing and cough EXAM: CHEST - 2 VIEW COMPARISON:  X-ray 09/28/2022. FINDINGS: Underinflation. No consolidation, pneumothorax or effusion. Normal cardiopericardial silhouette when adjusting for level of inflation. IMPRESSION: Underinflation.  No acute cardiopulmonary disease. Electronically Signed   By: Ranell Bring M.D.   On: 01/25/2023 15:34    Procedures Procedures    Medications Ordered in ED Medications  albuterol  (VENTOLIN  HFA) 108 (90 Base) MCG/ACT inhaler (2 puffs  Given 01/25/23 1515)  ipratropium-albuterol  (DUONEB) 0.5-2.5 (3) MG/3ML nebulizer solution 3 mL (3 mLs Nebulization Given 01/25/23 1623)    ED Course/ Medical Decision Making/ A&P                                 Medical Decision Making Amount and/or Complexity of Data Reviewed Labs: ordered. Radiology: ordered.  Risk Prescription drug management.   This patient is a 43 y.o. female  who presents to the ED for concern of shob.   Differential diagnoses prior to evaluation: The emergent differential diagnosis includes, but is not limited to,  asthma exacerbation, COPD exacerbation, acute upper respiratory infection, acute bronchitis, chronic bronchitis, interstitial lung disease, ARDS, PE, pneumonia, atypical ACS, carbon monoxide poisoning, spontaneous pneumothorax, new CHF vs CHF exacerbation, versus other . This is not an exhaustive differential.   Past Medical History / Co-morbidities / Social  History: Bipolar disorder, anxiety, obesity, asthma  Physical Exam: Physical exam performed. The pertinent findings include: Patient with some upper respiratory wheezing, and dry area cough.  She has no significant lower respiratory wheezing, respiratory distress, increased work of breathing, or focal consolidation noted.  Stable oxygen saturation on room air, she is somewhat hypertensive in the ED, BP 182/113.  Encourage close recheck with her PCP.  Lab Tests/Imaging studies: I personally interpreted labs/imaging and the pertinent results include: BMP with  mild hypokalemia, potassium 3.4, CBC overall unremarkable, other than some mild anemia, hemoglobin 11.3.  Her RVP is negative for COVID, flu, RSV.  I independently interpreted her plain film chest x-ray which shows no evidence of acute intrathoracic abnormality.. I agree with the radiologist interpretation.  Medications: I ordered medication including albuterol , DuoNeb for asthma exacerbation.  I have reviewed the patients home medicines and have made adjustments as needed.   Disposition: After consideration of the diagnostic results and the patients response to treatment, I feel that patient feeling improved on reevaluation, stable for discharge, will refill her inhaler and prescribe her a short burst of steroids, encourage close PCP follow-up.   emergency department workup does not suggest an emergent condition requiring admission or immediate intervention beyond what has been performed at this time. The plan is: as above. The patient is safe for discharge and has been instructed to return immediately for worsening symptoms, change in symptoms or any other concerns.  Final Clinical Impression(s) / ED Diagnoses Final diagnoses:  Moderate persistent asthma with exacerbation    Rx / DC Orders ED Discharge Orders          Ordered    albuterol  (VENTOLIN  HFA) 108 (90 Base) MCG/ACT inhaler        01/25/23 1730    predniSONE  (DELTASONE ) 20  MG tablet  Daily        01/25/23 1730              Rosan Sherlean DEL, PA-C 01/25/23 1731    Armenta Canning, MD 01/25/23 2215

## 2023-01-25 NOTE — ED Triage Notes (Signed)
 Pt c/o wheezing and coughing since the weekend. Pt has history of asthma.

## 2023-01-25 NOTE — Discharge Instructions (Signed)
 I have refilled your inhaler and send it to the pharmacy that you requested, as well as prescribed you a short burst of steroid to help with your asthma exacerbation.  Please follow-up with your primary care doctor to discuss getting a new nebulizer machine.

## 2023-01-25 NOTE — ED Notes (Signed)

## 2023-01-31 ENCOUNTER — Ambulatory Visit: Payer: BC Managed Care – PPO | Admitting: Family

## 2023-01-31 ENCOUNTER — Encounter: Payer: Self-pay | Admitting: Family

## 2023-01-31 VITALS — BP 162/108 | HR 93 | Temp 98.3°F | Resp 20 | Ht 66.0 in | Wt 221.4 lb

## 2023-01-31 DIAGNOSIS — Z23 Encounter for immunization: Secondary | ICD-10-CM

## 2023-01-31 DIAGNOSIS — J4540 Moderate persistent asthma, uncomplicated: Secondary | ICD-10-CM | POA: Diagnosis not present

## 2023-01-31 DIAGNOSIS — F319 Bipolar disorder, unspecified: Secondary | ICD-10-CM

## 2023-01-31 DIAGNOSIS — R053 Chronic cough: Secondary | ICD-10-CM

## 2023-01-31 DIAGNOSIS — F4310 Post-traumatic stress disorder, unspecified: Secondary | ICD-10-CM

## 2023-01-31 MED ORDER — DEXTROMETHORPHAN HBR 15 MG/5ML PO SYRP: 10.0000 mL | ORAL_SOLUTION | Freq: Four times a day (QID) | ORAL | 0 refills | Status: DC | PRN

## 2023-01-31 MED ORDER — HYDROXYZINE PAMOATE 25 MG PO CAPS: ORAL_CAPSULE | ORAL | 1 refills | Status: DC

## 2023-01-31 MED ORDER — FLUTICASONE-SALMETEROL 100-50 MCG/ACT IN AEPB: 1.0000 | INHALATION_SPRAY | Freq: Two times a day (BID) | RESPIRATORY_TRACT | 3 refills | Status: DC

## 2023-01-31 NOTE — Progress Notes (Signed)
Provider: [name] FNP-C  Darice Vicario, [name], NP  Patient Care Team: Ozell Juhasz, [name], NP as PCP - General (Family Medicine)  Extended Emergency Contact Information Primary Emergency Contact: [facility] Phone: [phone] Relation: Son Secondary Emergency Contact: [name],[name] Mobile Phone: [phone] Relation: Relative  Code Status:  Full Code  Goals of care: Advanced Directive information    01/31/2023    3:14 PM  Advanced Directives  Does Patient Have a Medical Advance Directive? No  Would patient like information on creating a medical advance directive? No - Patient declined     Chief Complaint  Patient presents with   Acute Visit    Acute asthma and breathing issues.    HPI:  Pt is a 43 y.o. female seen today for an acute visit for evaluation of asthma  and cough. She was seen on 01/25/2023 in the ED for asthma exacerbation after she presented with wheezing and coughing.  She had been out of her home inhalers.  Her labs were unremarkable except for mild hypokalemia 3.4 and mild anemia hemoglobin 11.3.  Her RSV, COVID and influenza were negative.  Checks x-ray showed no evidence of acute abnormality.  She was treated with albuterol and DuoNeb for asthma exacerbation.  Home inhalers were ordered.  Short course of steroids was also prescribed and advised to follow-up with PCP.  States cough has flared up again.She denies any fever,chills,fatigue,body aches,runny nose,chest tightness,chest pain,palpitation or shortness of breath.    Past Medical History:  Diagnosis Date   Anxiety    Bipolar 1 disorder (HCC)    PTSD (post-traumatic stress disorder)    Past Surgical History:  Procedure Laterality Date   ABDOMINAL HYSTERECTOMY     Fibroids/heavy menses    No Known Allergies  Outpatient Encounter Medications as of 01/31/2023  Medication Sig   albuterol (VENTOLIN HFA) 108 (90 Base) MCG/ACT inhaler INHALE 1-2 PUFFS BY MOUTH EVERY 6 HOURS AS NEEDED  FOR WHEEZE OR SHORTNESS OF BREATH   amLODipine-valsartan (EXFORGE) 10-320 MG tablet Take 1 tablet by mouth daily.   ARIPiprazole (ABILIFY) 10 MG tablet Take 1 tablet (10 mg total) by mouth daily.   fluticasone-salmeterol (ADVAIR) 100-50 MCG/ACT AEPB Inhale 1 puff into the lungs 2 (two) times daily.   hydrALAZINE (APRESOLINE) 50 MG tablet Take 1 tablet (50 mg total) by mouth 3 (three) times daily.   montelukast (SINGULAIR) 10 MG tablet Take 1 tablet (10 mg total) by mouth at bedtime.   Multiple Vitamin (MULTIVITAMIN) capsule Take 1 capsule by mouth daily.   predniSONE (DELTASONE) 20 MG tablet Take 2 tablets (40 mg total) by mouth daily.   fluticasone (FLONASE) 50 MCG/ACT nasal spray Place 2 sprays into both nostrils daily. (Patient not taking: Reported on 01/31/2023)   hydrOXYzine (VISTARIL) 25 MG capsule Take one capsule by mouth in the morning and two at bed time (Patient not taking: Reported on 01/31/2023)   lamoTRIgine (LAMICTAL) 25 MG tablet Take one tablet by mouth daily for one week and then 2 tab daily (Patient not taking: Reported on 01/31/2023)   senna-docusate (SENOKOT-S) 8.6-50 MG tablet Take 1 tablet by mouth at bedtime. (Patient not taking: Reported on 01/31/2023)   No facility-administered encounter medications on file as of 01/31/2023.    Review of Systems  Constitutional:  Negative for appetite change, chills, fatigue, fever and unexpected weight change.  HENT:  Negative for congestion, ear discharge, ear pain, hearing loss, nosebleeds, postnasal drip, rhinorrhea, sinus pressure, sinus pain, sneezing, sore throat, tinnitus and trouble swallowing.  Eyes:  Negative for pain, discharge, redness, itching and visual disturbance.  Respiratory:  Positive for cough and wheezing. Negative for chest tightness and shortness of breath.   Cardiovascular:  Negative for chest pain, palpitations and leg swelling.  Gastrointestinal:  Negative for abdominal distention, abdominal pain, diarrhea,  nausea and vomiting.  Musculoskeletal:  Negative for gait problem, myalgias, neck pain and neck stiffness.  Skin:  Negative for color change, pallor and rash.  Neurological:  Negative for dizziness, weakness, light-headedness and headaches.    Immunization History  Administered Date(s) Administered   Influenza, Seasonal, Injecte, Preservative Fre 11/26/2014   Influenza,inj,Quad PF,6+ Mos 11/26/2014, 12/15/2016   Tdap 02/02/2021   Pertinent  Health Maintenance Due  Topic Date Due   INFLUENZA VACCINE  08/17/2022      05/01/2022    2:43 PM 05/17/2022    9:35 AM 09/14/2022   11:17 AM 09/28/2022    9:23 AM 01/31/2023    3:10 PM  Fall Risk  Falls in the past year? 0 0 0 0 0  Was there an injury with Fall? 0 0 0 0 0  Fall Risk Category Calculator 0 0 0 0 0  Patient at Risk for Falls Due to  No Fall Risks No Fall Risks No Fall Risks   Fall risk Follow up  Falls evaluation completed Falls evaluation completed;Education provided;Falls prevention discussed Falls evaluation completed;Education provided;Falls prevention discussed    Functional Status Survey:    Vitals:   01/31/23 1518 01/31/23 1531  BP: (!) 170/110 (!) 162/108  Pulse: 93   Resp: 20   Temp: 98.3 F (36.8 C)   SpO2: 94%   Weight: 221 lb 6.4 oz (100.4 kg)   Height: 5\' 6"  (1.676 m)    Body mass index is 35.73 kg/m. Physical Exam Vitals reviewed.  Constitutional:      General: She is not in acute distress.    Appearance: Normal appearance. She is normal weight. She is not ill-appearing or diaphoretic.  HENT:     Head: Normocephalic.     Right Ear: Tympanic membrane, ear canal and external ear normal. There is no impacted cerumen.     Left Ear: Tympanic membrane, ear canal and external ear normal. There is no impacted cerumen.     Nose: Nose normal. No congestion or rhinorrhea.     Mouth/Throat:     Mouth: Mucous membranes are moist.     Pharynx: Oropharynx is clear. No oropharyngeal exudate or posterior  oropharyngeal erythema.  Eyes:     General: No scleral icterus.       Right eye: No discharge.        Left eye: No discharge.     Extraocular Movements: Extraocular movements intact.     Conjunctiva/sclera: Conjunctivae normal.     Pupils: Pupils are equal, round, and reactive to light.  Cardiovascular:     Rate and Rhythm: Normal rate and regular rhythm.     Pulses: Normal pulses.     Heart sounds: Normal heart sounds. No murmur heard.    No friction rub. No gallop.  Pulmonary:     Effort: Pulmonary effort is normal. No respiratory distress.     Breath sounds: Examination of the right-upper field reveals wheezing. Examination of the left-upper field reveals wheezing. Examination of the right-middle field reveals wheezing. Wheezing present. No rhonchi or rales.  Chest:     Chest wall: No tenderness.  Abdominal:     General: Bowel sounds are normal. There is no distension.  Palpations: Abdomen is soft. There is no mass.     Tenderness: There is no abdominal tenderness. There is no right CVA tenderness, left CVA tenderness, guarding or rebound.  Musculoskeletal:        General: No swelling or tenderness. Normal range of motion.     Cervical back: Normal range of motion. No rigidity or tenderness.     Right lower leg: No edema.     Left lower leg: No edema.  Lymphadenopathy:     Cervical: No cervical adenopathy.  Skin:    General: Skin is warm and dry.     Coloration: Skin is not pale.     Findings: No erythema or rash.  Neurological:     Mental Status: She is alert and oriented to person, place, and time.     Gait: Gait normal.  Psychiatric:        Mood and Affect: Mood normal.        Speech: Speech normal.        Behavior: Behavior normal.     Labs reviewed: Recent Labs    05/17/22 0958 01/25/23 1610  NA 138 138  K 3.6 3.4*  CL 104 106  CO2 26 26  GLUCOSE 91 134*  BUN 9 9  CREATININE 0.82 0.79  CALCIUM 9.0 8.8*   Recent Labs    05/17/22 0958  AST 11  ALT  12  BILITOT 0.4  PROT 6.9   Recent Labs    05/17/22 0958 01/25/23 1610  WBC 4.7 4.9  NEUTROABS 2,430  --   HGB 11.3* 11.3*  HCT 37.1 38.0  MCV 72.0* 71.6*  PLT 306 295   Lab Results  Component Value Date   TSH 3.38 05/17/2022   No results found for: "HGBA1C" Lab Results  Component Value Date   CHOL 175 05/17/2022   HDL 41 (L) 05/17/2022   LDLCALC 112 (H) 05/17/2022   TRIG 116 05/17/2022   CHOLHDL 4.3 05/17/2022    Significant Diagnostic Results in last 30 days:  DG Chest 2 View Result Date: 01/25/2023 CLINICAL DATA:  Wheezing and cough EXAM: CHEST - 2 VIEW COMPARISON:  X-ray 09/28/2022. FINDINGS: Underinflation. No consolidation, pneumothorax or effusion. Normal cardiopericardial silhouette when adjusting for level of inflation. IMPRESSION: Underinflation.  No acute cardiopulmonary disease. Electronically Signed   By: Karen Kays M.D.   On: 01/25/2023 15:34    Assessment/Plan 1. Need for influenza vaccination Afebrile  - Flut shot administered by CMA no acute reaction reported.  - Flu vaccine trivalent PF, 6mos and older(Flulaval,Afluria,Fluarix,Fluzone)  2. Moderate persistent asthma without complication (Primary) Bilateral upper lung wheezing with dry cough noted. -Continue on albuterol as needed will add Advair side effects discussed. - fluticasone-salmeterol (ADVAIR) 100-50 MCG/ACT AEPB; Inhale 1 puff into the lungs 2 (two) times daily.  Dispense: 1 each; Refill: 3  3. PTSD (post-traumatic stress disorder) Continue hydroxyzine - hydrOXYzine (VISTARIL) 25 MG capsule; Take one capsule by mouth in the morning and two at bed time  Dispense: 90 capsule; Refill: 1  4. Bipolar I disorder (HCC) Reports not taking Lamictal unclear why she has stopped -Continue on Abilify - hydrOXYzine (VISTARIL) 25 MG capsule; Take one capsule by mouth in the morning and two at bed time  Dispense: 90 capsule; Refill: 1  5. Persistent dry cough Suspect due to asthma bilateral upper  lung wheezes noted with clear bases Start on dextromethorphan as below -Advised to notify provider or go to the ED if symptoms worsen or fail to  improve - dextromethorphan 15 MG/5ML syrup; Take 10 mLs (30 mg total) by mouth 4 (four) times daily as needed for cough.  Dispense: 120 mL; Refill: 0  Family/ staff Communication: Reviewed plan of care with patient verbalized understanding  Labs/tests ordered: None   Next Appointment: Return if symptoms worsen or fail to improve.   Caesar Bookman, NP

## 2023-01-31 NOTE — Patient Instructions (Signed)
 Notify provider or go to ED if symptoms worsen  - Restart Advair as directed

## 2023-02-10 IMAGING — DX DG CHEST 2V
2 series · 2 of 2 positions shown · non-contrast
Comparison: 10/16/2020

CLINICAL DATA: Cough. Blood tinged secretion with cough. Upper
airway wheezing.

EXAM:
CHEST - 2 VIEW

[chest pa]
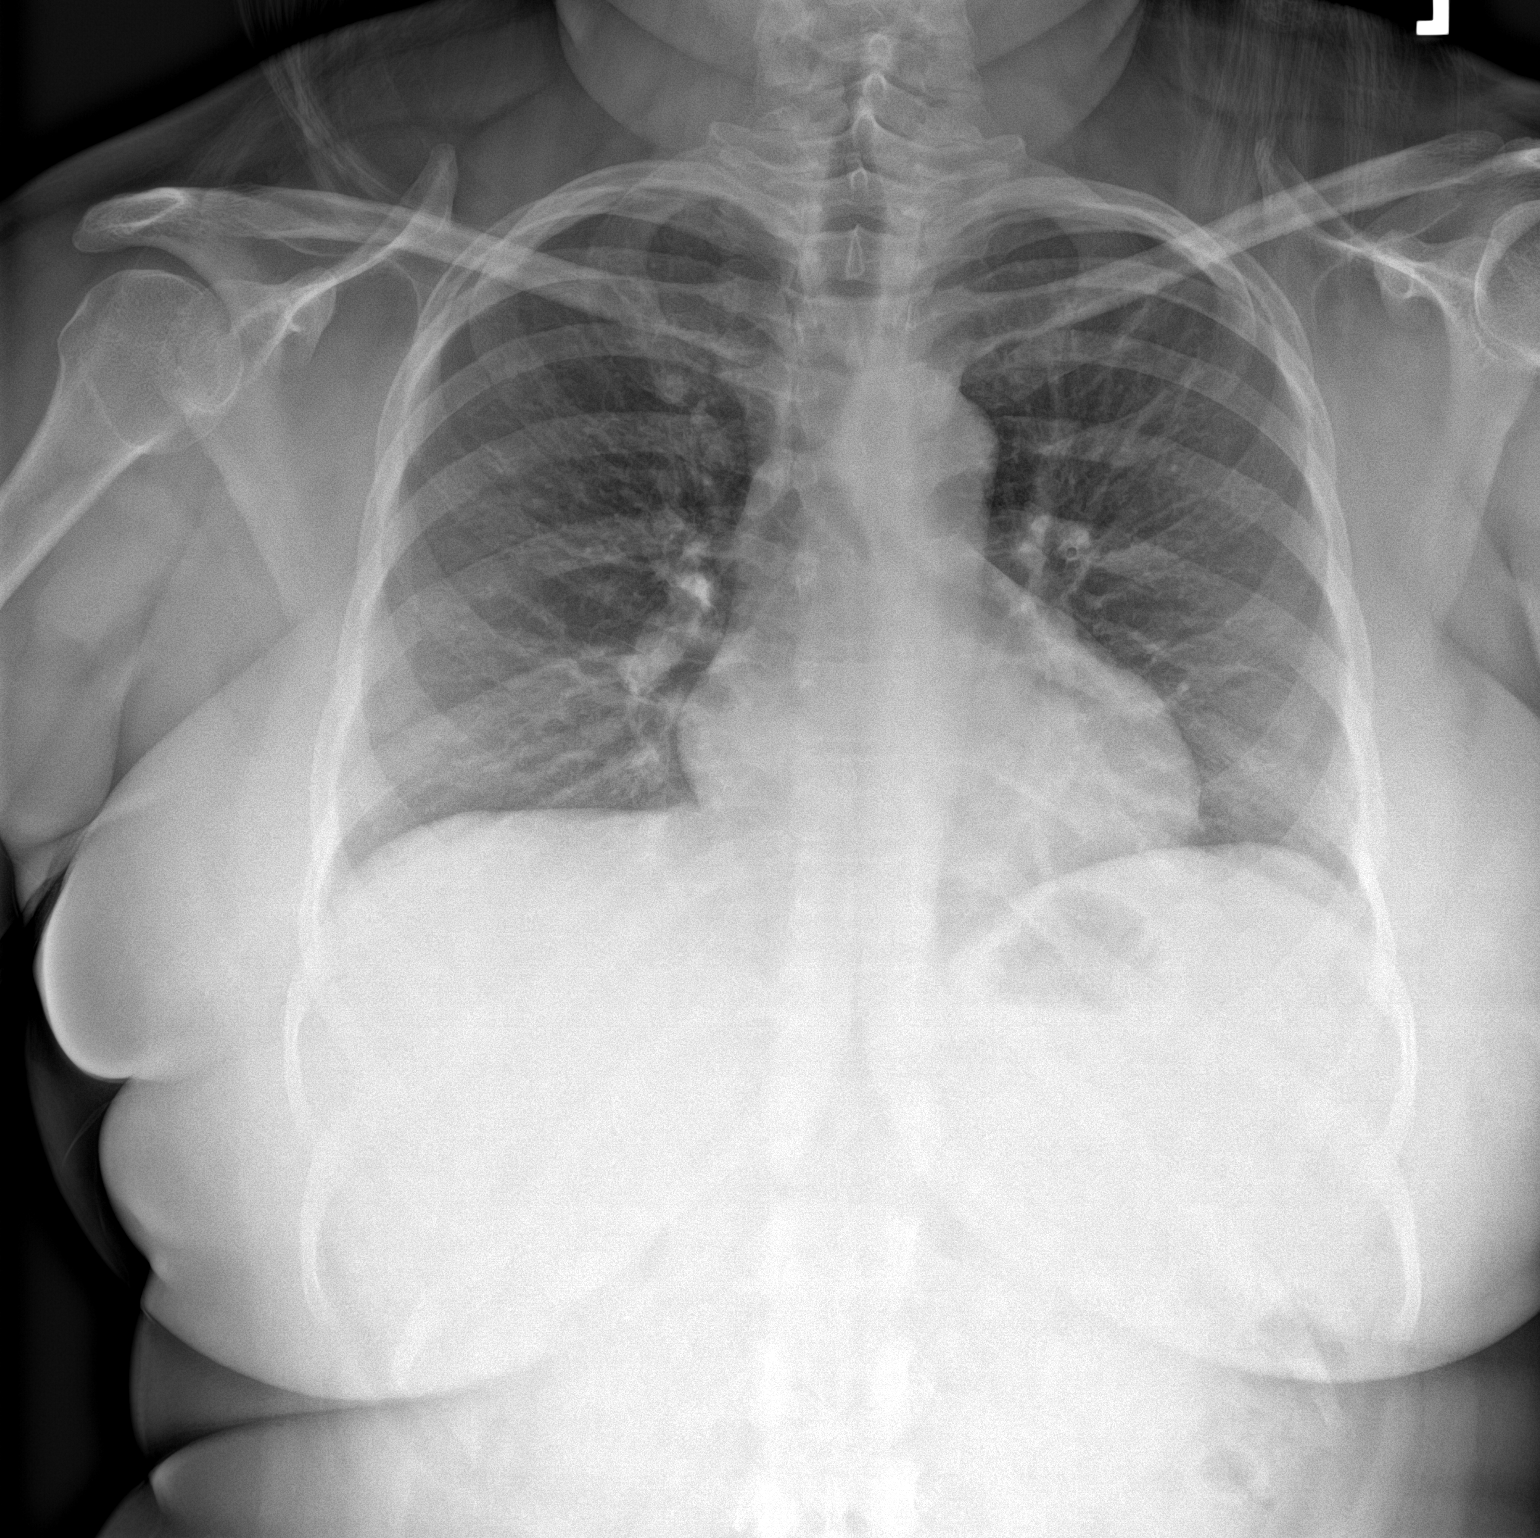

[chest lat]
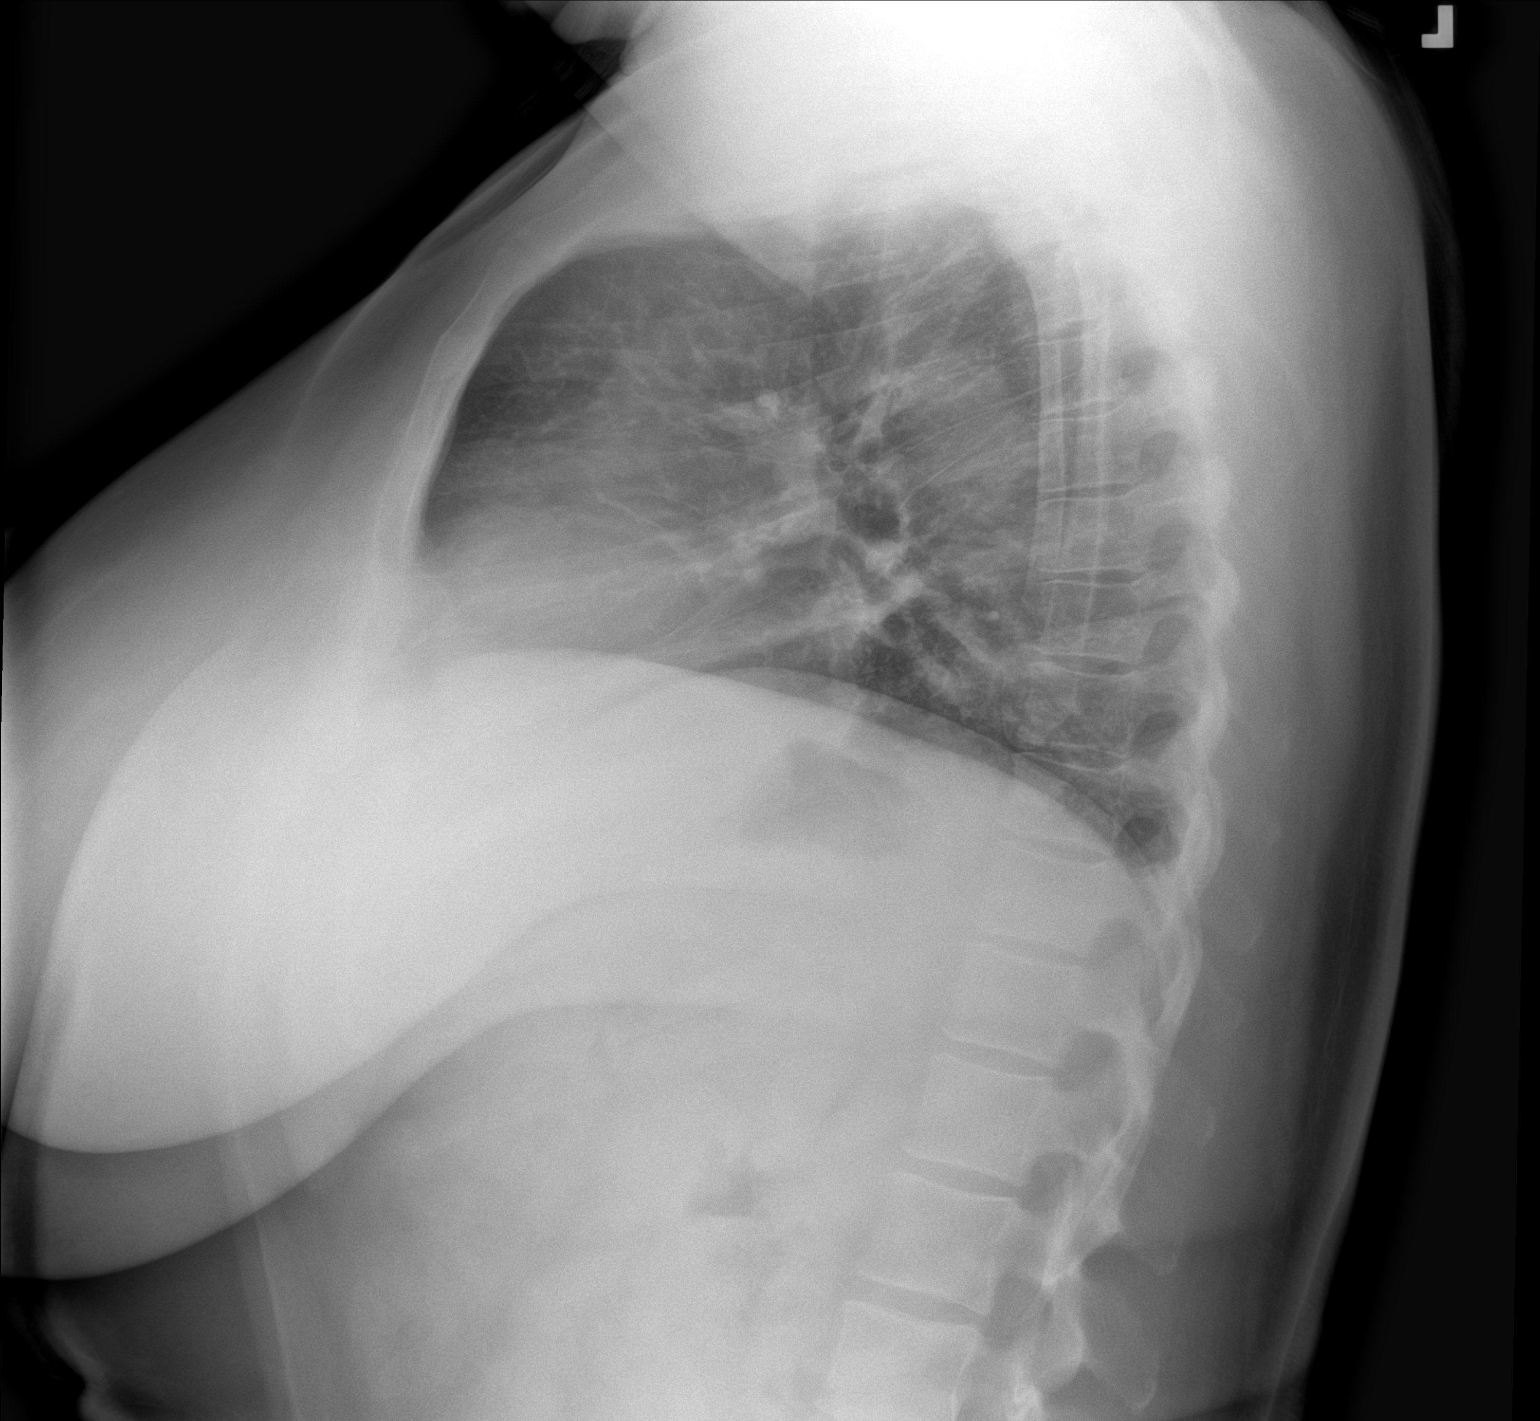

[2 of 2 positions shown; findings below may reference images not displayed]

FINDINGS: The heart size and mediastinal contours are within normal limits.
Both lungs are clear. The visualized skeletal structures are
unremarkable.
IMPRESSION: No active cardiopulmonary disease.

## 2023-02-22 ENCOUNTER — Other Ambulatory Visit: Payer: Self-pay | Admitting: Family

## 2023-02-22 DIAGNOSIS — F4310 Post-traumatic stress disorder, unspecified: Secondary | ICD-10-CM

## 2023-02-22 DIAGNOSIS — F319 Bipolar disorder, unspecified: Secondary | ICD-10-CM

## 2023-03-13 ENCOUNTER — Encounter: Payer: Self-pay | Admitting: Cardiology

## 2023-03-13 ENCOUNTER — Ambulatory Visit: Payer: BC Managed Care – PPO | Attending: Cardiology | Admitting: Cardiology

## 2023-03-13 VITALS — BP 168/110 | HR 72 | Resp 16 | Ht 66.0 in | Wt 221.6 lb

## 2023-03-13 DIAGNOSIS — R9431 Abnormal electrocardiogram [ECG] [EKG]: Secondary | ICD-10-CM | POA: Diagnosis not present

## 2023-03-13 DIAGNOSIS — E669 Obesity, unspecified: Secondary | ICD-10-CM | POA: Diagnosis not present

## 2023-03-13 DIAGNOSIS — I10 Essential (primary) hypertension: Secondary | ICD-10-CM | POA: Diagnosis not present

## 2023-03-13 MED ORDER — LABETALOL HCL 200 MG PO TABS: 200.0000 mg | ORAL_TABLET | Freq: Two times a day (BID) | ORAL | 2 refills | Status: DC

## 2023-03-13 MED ORDER — SPIRONOLACTONE-HCTZ 25-25 MG PO TABS
1.0000 | ORAL_TABLET | ORAL | 2 refills | Status: DC
Start: 2023-03-13 — End: 2023-05-01

## 2023-03-13 NOTE — Patient Instructions (Signed)
 Medication Instructions:  Your physician has recommended you make the following change in your medication:  Start labetalol 200 mg by mouth twice daily  Start spironolactone hydrochlorothiazide 25/25 by mouth daily   *If you need a refill on your cardiac medications before your next appointment, please call your pharmacy*   Lab Work: Have lab work done (BMP) in 3 weeks.  Can be done at any [facility].  There is an office on the first floor of our building If you have labs (blood work) drawn today and your tests are completely normal, you will receive your results only by: MyChart Message (if you have MyChart) OR A paper copy in the mail If you have any lab test that is abnormal or we need to change your treatment, we will call you to review the results.   Testing/Procedures: Your physician has requested that you have an echocardiogram. Echocardiography is a painless test that uses sound waves to create images of your heart. It provides your doctor with information about the size and shape of your heart and how well your heart's chambers and valves are working. This procedure takes approximately one hour. There are no restrictions for this procedure. Please do NOT wear cologne, perfume, aftershave, or lotions (deodorant is allowed). Please arrive 15 minutes prior to your appointment time.  Please note: We ask at that you not bring children with you during ultrasound (echo/ vascular) testing. Due to room size and safety concerns, children are not allowed in the ultrasound rooms during exams. Our front office staff cannot provide observation of children in our lobby area while testing is being conducted. An adult accompanying a patient to their appointment will only be allowed in the ultrasound room at the discretion of the ultrasound technician under special circumstances. We apologize for any inconvenience.    Follow-Up: At [facility], you and your health needs are our priority.  As  part of our continuing mission to provide you with exceptional heart care, we have created designated Provider Care Teams.  These Care Teams include your primary Cardiologist (physician) and Advanced Practice Providers (APPs -  Physician Assistants and Nurse Practitioners) who all work together to provide you with the care you need, when you need it.  We recommend signing up for the patient portal called "MyChart".  Sign up information is provided on this After Visit Summary.  MyChart is used to connect with patients for Virtual Visits (Telemedicine).  Patients are able to view lab/test results, encounter notes, upcoming appointments, etc.  Non-urgent messages can be sent to your provider as well.   To learn more about what you can do with MyChart, go to [email or web address].    Your next appointment:   6 -8 week(s)  Provider:   Yates Decamp, MD     Other Instructions  You have been referred to Healthy weight and wellness

## 2023-03-13 NOTE — Progress Notes (Signed)
 Cardiology Office Note:  .   Date:  03/13/2023  ID:  [name], DOB May 14, 1980, MRN [id number] PCP: Caesar Bookman, NP  [facility] Providers Cardiologist:  Yates Decamp, MD   History of Present Illness: .   Deanna Powell is a 43 y.o. African-American female patient with bipolar disorder, hypertension, reactive airway disease referred to me for evaluation and management of hypertension.  Patient has been trying to adhere to her medications, also trying to make healthy choices however states that since she has been on bipolar medications, she has gained significant amount of weight.  She has otherwise no specific complaints and occasionally has headaches with elevated blood pressure.  Denies dyspnea, chest pain, palpitations.  Discussed the use of AI scribe software for clinical note transcription with the patient, who gave verbal consent to proceed.  History of Present Illness   The patient, with a history of high blood pressure, asthma, and bipolar disorder, presents for a follow-up visit for blood pressure control. She has been on amlodipine, valsartan, and hydralazine for about a year and a half. She reports adherence to her medication regimen. She was recently on a short course of prednisone for her asthma, which she has completed. She reports experiencing swelling in her feet, which has limited her ability to exercise. She also reports difficulty with sleep and has been diagnosed with mild sleep apnea. She is currently displaced and living with a friend. She has been experiencing difficulty with sleep and has been diagnosed with mild sleep apnea. She reports that she has been taking her bipolar medication, Lamictal, as prescribed. She also reports difficulty affording healthy food options and often resorts to ready-made meals.      Labs   Lab Results  Component Value Date   CHOL 175 05/17/2022   HDL 41 (L) 05/17/2022   LDLCALC 112 (H) 05/17/2022   TRIG 116 05/17/2022   CHOLHDL  4.3 05/17/2022   Lab Results  Component Value Date   NA 138 01/25/2023   K 3.4 (L) 01/25/2023   CO2 26 01/25/2023   GLUCOSE 134 (H) 01/25/2023   BUN 9 01/25/2023   CREATININE 0.79 01/25/2023   CALCIUM 8.8 (L) 01/25/2023   GFR 86.83 02/02/2021   EGFR 91 05/17/2022   GFRNONAA >60 01/25/2023      Latest Ref Rng & Units 01/25/2023    4:10 PM 05/17/2022    9:58 AM 10/21/2021   11:56 AM  BMP  Glucose 70 - 99 mg/dL 782  91  89   BUN 6 - 20 mg/dL 9  9  8    Creatinine 0.44 - 1.00 mg/dL 9.56  2.13  0.86   BUN/Creat Ratio 6 - 22 (calc)  SEE NOTE:  SEE NOTE:   Sodium 135 - 145 mmol/L 138  138  141   Potassium 3.5 - 5.1 mmol/L 3.4  3.6  4.0   Chloride 98 - 111 mmol/L 106  104  106   CO2 22 - 32 mmol/L 26  26  28    Calcium 8.9 - 10.3 mg/dL 8.8  9.0  9.2       Latest Ref Rng & Units 01/25/2023    4:10 PM 05/17/2022    9:58 AM 10/21/2021   11:56 AM  CBC  WBC 4.0 - 10.5 K/uL 4.9  4.7  4.5   Hemoglobin 12.0 - 15.0 g/dL 57.8  46.9  62.9   Hematocrit 36.0 - 46.0 % 38.0  37.1  39.3   Platelets 150 -  400 K/uL 295  306  315    No results found for: "HGBA1C"  Lab Results  Component Value Date   TSH 3.38 05/17/2022   Review of Systems  Cardiovascular:  Negative for chest pain, dyspnea on exertion and leg swelling.   Physical Exam:   VS:  BP (!) 168/110 (BP Location: Left Arm, Patient Position: Sitting, Cuff Size: Large)   Pulse 72   Resp 16   Ht 5\' 6"  (1.676 m)   Wt 221 lb 9.6 oz (100.5 kg)   SpO2 98%   BMI 35.77 kg/m    Wt Readings from Last 3 Encounters:  03/13/23 221 lb 9.6 oz (100.5 kg)  01/31/23 221 lb 6.4 oz (100.4 kg)  10/31/22 215 lb 9.6 oz (97.8 kg)    Physical Exam Constitutional:      Appearance: She is obese.  Neck:     Vascular: No carotid bruit or JVD.  Cardiovascular:     Rate and Rhythm: Normal rate and regular rhythm.     Pulses: Intact distal pulses.     Heart sounds: Normal heart sounds. No murmur heard.    No gallop.  Pulmonary:     Effort: Pulmonary effort  is normal.     Breath sounds: Normal breath sounds.  Abdominal:     General: Bowel sounds are normal.     Palpations: Abdomen is soft.  Musculoskeletal:     Right lower leg: No edema.     Left lower leg: No edema.    Studies Reviewed: .    NA EKG:    EKG Interpretation Date/Time:  Tuesday March 13 2023 11:22:09 EST Ventricular Rate:  78 PR Interval:  174 QRS Duration:  84 QT Interval:  390 QTC Calculation: 444 R Axis:   1  Text Interpretation: EKG 03/13/2023: Normal sinus rhythm with rate of 78 bpm, normal axis, high lateral T wave inversion, consider LVH versus ischemia.  Compared to 01/13/2022, diffuse inferior and lateral ST-T nonspecific changes not present. Confirmed by Delrae Rend [phone]) on 03/13/2023 11:27:27 AM    EKG 03/12/2021: Normal sinus rhythm at rate of 98 bpm, diffuse inferolateral nonspecific T wave abnormality, cannot exclude ischemia.   Medications and allergies    No Known Allergies   Current Outpatient Medications:    albuterol (VENTOLIN HFA) 108 (90 Base) MCG/ACT inhaler, INHALE 1-2 PUFFS BY MOUTH EVERY 6 HOURS AS NEEDED FOR WHEEZE OR SHORTNESS OF BREATH, Disp: 18 each, Rfl: 3   amLODipine-valsartan (EXFORGE) 10-320 MG tablet, Take 1 tablet by mouth daily., Disp: 30 tablet, Rfl: 2   ARIPiprazole (ABILIFY) 10 MG tablet, Take 1 tablet (10 mg total) by mouth daily., Disp: 30 tablet, Rfl: 0   dextromethorphan 15 MG/5ML syrup, Take 10 mLs (30 mg total) by mouth 4 (four) times daily as needed for cough., Disp: 120 mL, Rfl: 0   fluticasone (FLONASE) 50 MCG/ACT nasal spray, Place 2 sprays into both nostrils daily., Disp: 11.1 mL, Rfl: 2   fluticasone-salmeterol (ADVAIR) 100-50 MCG/ACT AEPB, Inhale 1 puff into the lungs 2 (two) times daily., Disp: 1 each, Rfl: 3   hydrALAZINE (APRESOLINE) 50 MG tablet, Take 1 tablet (50 mg total) by mouth 3 (three) times daily., Disp: 270 tablet, Rfl: 3   hydrOXYzine (VISTARIL) 25 MG capsule, TAKE ONE CAPSULE BY MOUTH IN  THE MORNING AND TWO AT BED TIME, Disp: 270 capsule, Rfl: 1   labetalol (NORMODYNE) 200 MG tablet, Take 1 tablet (200 mg total) by mouth 2 (two) times daily., Disp:  60 tablet, Rfl: 2   lamoTRIgine (LAMICTAL) 25 MG tablet, Take one tablet by mouth daily for one week and then 2 tab daily, Disp: 60 tablet, Rfl: 0   montelukast (SINGULAIR) 10 MG tablet, Take 1 tablet (10 mg total) by mouth at bedtime., Disp: 90 tablet, Rfl: 1   Multiple Vitamin (MULTIVITAMIN) capsule, Take 1 capsule by mouth daily., Disp: 90 capsule, Rfl: 3   senna-docusate (SENOKOT-S) 8.6-50 MG tablet, Take 1 tablet by mouth at bedtime., Disp: 30 tablet, Rfl: 3   spironolactone-hydrochlorothiazide (ALDACTAZIDE) 25-25 MG tablet, Take 1 tablet by mouth every morning., Disp: 30 tablet, Rfl: 2   ASSESSMENT AND PLAN: .      ICD-10-CM   1. Primary hypertension  I10 labetalol (NORMODYNE) 200 MG tablet    spironolactone-hydrochlorothiazide (ALDACTAZIDE) 25-25 MG tablet    Basic Metabolic Panel (BMET)    ECHOCARDIOGRAM COMPLETE    CANCELED: EKG 12-Lead    2. Nonspecific abnormal electrocardiogram (ECG) (EKG)  R94.31 EKG 12-Lead    Basic Metabolic Panel (BMET)    ECHOCARDIOGRAM COMPLETE    3. Moderate obesity  E66.9 Amb Ref to Medical Weight Management     Assessment and Plan    Hypertension Long-standing hypertension remains poorly controlled despite adherence to amlodipine, valsartan, and hydralazine. Blood pressure is significantly elevated. Financial constraints limit access to healthier food options, affecting salt intake management. Additional medications are necessary, with potential reduction once controlled. Monitoring kidney function with new medications is crucial. Start Aldactazide 25/25 mg once daily in the morning and labetalol 200 mg twice daily. Order BMP in 2-3 weeks to monitor kidney function. Schedule a follow-up appointment in 6 weeks.  Abnormal EKG EKG is actually improved compared to 2023 EKG.  Suspect  hypertensive heart disease.  Will address mild hypercholesterolemia as well, could consider coronary calcium score.  However primary prevention with weight loss, treatment of sleep apnea that has now been diagnosed recently, lifestyle changes will certainly help with blood pressure control and improvement in overall health.  Will check echocardiogram.  Obesity Significant weight gain is likely due to medication side effects and limited exercise from foot swelling. Financial constraints hinder access to healthier food. Refer to health and wellness for weight loss support.  Asthma Asthma was previously treated with a short course of prednisone, which is now completed. No current use of steroids. No new medications or changes are required at this time.  Sleep Apnea Mild sleep apnea is managed with a CPAP machine, as previously evaluated by pulmonary care. No new interventions are required at this time.  Bipolar Disorder Bipolar disorder is managed with lamotrigine (Lamictal). Adherence to medication is reported, but significant side effects, including weight gain, affect overall health. Discussed the impact of medication side effects and the importance of continued adherence. Continue the current medication regimen and encourage adherence.  General Health Maintenance Advised to make healthier dietary choices despite financial constraints, emphasizing the avoidance of fried foods, nuts, chips, dips, and pickles to manage blood pressure and overall health. Provide dietary counseling and support.  Follow-up Schedule an echocardiogram and a follow-up appointment in 6 weeks.           Signed,  Yates Decamp, MD, [facility] 03/13/2023, 7:43 PM [facility] [address] [place], [place] [address] Phone: [phone]. Fax:  [phone]

## 2023-03-14 ENCOUNTER — Encounter: Payer: Self-pay | Admitting: Bariatrics

## 2023-03-14 ENCOUNTER — Ambulatory Visit (INDEPENDENT_AMBULATORY_CARE_PROVIDER_SITE_OTHER): Payer: BC Managed Care – PPO | Admitting: Bariatrics

## 2023-03-14 VITALS — BP 160/104 | HR 87 | Temp 98.2°F | Ht 65.5 in | Wt 216.0 lb

## 2023-03-14 DIAGNOSIS — E669 Obesity, unspecified: Secondary | ICD-10-CM

## 2023-03-14 DIAGNOSIS — I10 Essential (primary) hypertension: Secondary | ICD-10-CM

## 2023-03-14 DIAGNOSIS — Z6835 Body mass index (BMI) 35.0-35.9, adult: Secondary | ICD-10-CM | POA: Diagnosis not present

## 2023-03-14 DIAGNOSIS — G4733 Obstructive sleep apnea (adult) (pediatric): Secondary | ICD-10-CM

## 2023-03-14 NOTE — Progress Notes (Signed)
 Office: [phone]  /  Fax: [phone]   Initial Visit  Deanna Powell was seen in clinic today to evaluate for obesity. She is interested in losing weight to improve overall health and reduce the risk of weight related complications. She presents today to review program treatment options, initial physical assessment, and evaluation.     She was referred by: Specialist ( cardiologist )   When asked what else they would like to accomplish? She states: Adopt healthier eating patterns, Improve energy levels and physical activity, and Improve quality of life  When asked how has your weight affected you? She states: Contributed to medical problems, Having fatigue, Having poor endurance, and Has affected mood   Some associated conditions: Hypertension and Other: asthma  Contributing factors: Family history of obesity, Reduced physical activity, Mental health problems, and Slow metabolism for age  Weight promoting medications identified: Psychotropic medications  Current nutrition plan: High-protein, Portion control / smart choices, and Other: Keto  Current level of physical activity: NEAT  Current or previous pharmacotherapy: None  Response to medication: Never tried medications   Past medical history includes:   Past Medical History:  Diagnosis Date   Anxiety    Bipolar 1 disorder (HCC)    PTSD (post-traumatic stress disorder)      Objective:   BP (!) 160/104   Pulse 87   Temp 98.2 F (36.8 C)   Ht 5' 5.5" (1.664 m)   Wt 216 lb (98 kg)   SpO2 99%   BMI 35.40 kg/m  She was weighed on the bioimpedance scale: Body mass index is 35.4 kg/m.  Peak Weight:now , Body Fat%:41.9%, Visceral Fat Rating:10, Weight trend over the last 12 months: Increasing  General:  Alert, oriented and cooperative. Patient is in no acute distress.  Respiratory: Normal respiratory effort, no problems with respiration noted  Extremities: Normal range of motion.    Mental Status: Normal mood and  affect. Normal behavior. Normal judgment and thought content.   DIAGNOSTIC DATA REVIEWED:  BMET    Component Value Date/Time   NA 138 01/25/2023 1610   K 3.4 (L) 01/25/2023 1610   CL 106 01/25/2023 1610   CO2 26 01/25/2023 1610   GLUCOSE 134 (H) 01/25/2023 1610   BUN 9 01/25/2023 1610   CREATININE 0.79 01/25/2023 1610   CREATININE 0.82 05/17/2022 0958   CALCIUM 8.8 (L) 01/25/2023 1610   GFRNONAA >60 01/25/2023 1610   No results found for: "HGBA1C" No results found for: "INSULIN" CBC    Component Value Date/Time   WBC 4.9 01/25/2023 1610   RBC 5.31 (H) 01/25/2023 1610   HGB 11.3 (L) 01/25/2023 1610   HCT 38.0 01/25/2023 1610   PLT 295 01/25/2023 1610   MCV 71.6 (L) 01/25/2023 1610   MCH 21.3 (L) 01/25/2023 1610   MCHC 29.7 (L) 01/25/2023 1610   RDW 16.9 (H) 01/25/2023 1610   Iron/TIBC/Ferritin/ %Sat    Component Value Date/Time   IRON 28 (L) 07/12/2016 1611   FERRITIN 19.3 07/12/2016 1611   Lipid Panel     Component Value Date/Time   CHOL 175 05/17/2022 0958   TRIG 116 05/17/2022 0958   HDL 41 (L) 05/17/2022 0958   CHOLHDL 4.3 05/17/2022 0958   VLDL 15.6 02/02/2021 1158   LDLCALC 112 (H) 05/17/2022 0958   Hepatic Function Panel     Component Value Date/Time   PROT 6.9 05/17/2022 0958   ALBUMIN 4.1 02/02/2021 1158   AST 11 05/17/2022 0958   ALT 12 05/17/2022  0958   ALKPHOS 56 02/02/2021 1158   BILITOT 0.4 05/17/2022 0958   BILIDIR 0.1 12/15/2016 1512   IBILI 0.2 12/15/2016 1512      Component Value Date/Time   TSH 3.38 05/17/2022 0958     Assessment and Plan:   Hypertension Hypertension poorly controlled.  She states that she saw a cardiologist yesterday through [facility] and that a new blood pressure medicine was added.  Medication(s): Exforge 10-320 mg tablet, Hydralazine 50 mg, Labetalol 200 mg, Aldactazide 25-25 mg  BP Readings from Last 3 Encounters:  03/14/23 (!) 160/104  03/13/23 (!) 168/110  01/31/23 (!) 162/108   Lab Results  Component  Value Date   CREATININE 0.79 01/25/2023   CREATININE 0.82 05/17/2022   CREATININE 0.84 10/21/2021   Lab Results  Component Value Date   GFR 86.83 02/02/2021   GFR 112.28 08/15/2017   GFR 124.34 10/09/2016    Plan: Continue all antihypertensives at current dosages. No added salt. Will keep sodium content to 1,500 mg or less per day.   She will follow-up with her PCP and cardiologist in regard to her blood pressure and will continue to take her medications as directed will not miss any doses. She will sign up for our program and will begin a plan and exercise.  She may qualify for medication based on her sleep study.  Obstructive Sleep Apnea Deanna Powell has a diagnosis of sleep apnea. She reports that she just recently had a sleep study and is currently being scheduled for a CPAP machine.  Plan: She will follow-up in regard to her CPAP machine.  Continue to practice good sleep hygiene.  Will add in elements of an antiinflammatory diet and  add in elements of a Mediterranean diet.  Reduce intake of carbohydrates for weight loss.       Generalized Obesity: Current BMI 35.40    Obesity Treatment / Action Plan:  Patient will work on garnering support from family and friends to begin weight loss journey. Will work on eliminating or reducing the presence of highly palatable, calorie dense foods in the home. Will complete provided nutritional and psychosocial assessment questionnaire before the next appointment. Will be scheduled for indirect calorimetry to determine resting energy expenditure in a fasting state.  This will allow [place] to create a reduced calorie, high-protein meal plan to promote loss of fat mass while preserving muscle mass. Counseled on the health benefits of losing 5%-15% of total body weight. Was counseled on nutritional approaches to weight loss and benefits of reducing processed foods and consuming plant-based foods and high quality protein as part of nutritional weight  management. Was counseled on pharmacotherapy and role as an adjunct in weight management.   Obesity Education Performed Today:  She was weighed on the bioimpedance scale and results were discussed and documented in the synopsis.  We discussed obesity as a disease and the importance of a more detailed evaluation of all the factors contributing to the disease.  We discussed the importance of long term lifestyle changes which include nutrition, exercise and behavioral modifications as well as the importance of customizing this to her specific health and social needs.  We discussed the benefits of reaching a healthier weight to alleviate the symptoms of existing conditions and reduce the risks of the biomechanical, metabolic and psychological effects of obesity.  Discussed New Patient/Late Arrival, and Cancellation Policies. Patient voiced understanding and allowed to ask questions.   Deanna Powell appears to be in the action stage of change and states  they are ready to start intensive lifestyle modifications and behavioral modifications.  30 minutes was spent today on this visit including the above counseling, pre-visit chart review, and post-visit documentation.  Reviewed by clinician on day of visit: allergies, medications, problem list, medical history, surgical history, family history, social history, and previous encounter notes.    Nihal Marzella A. Lorretta HarpO.

## 2023-03-22 ENCOUNTER — Encounter: Payer: Self-pay | Admitting: Family Medicine

## 2023-03-22 ENCOUNTER — Ambulatory Visit (INDEPENDENT_AMBULATORY_CARE_PROVIDER_SITE_OTHER): Payer: BC Managed Care – PPO | Admitting: Family Medicine

## 2023-03-22 VITALS — BP 151/92 | HR 85 | Temp 97.5°F | Resp 18 | Ht 65.0 in | Wt 218.2 lb

## 2023-03-22 DIAGNOSIS — Z7689 Persons encountering health services in other specified circumstances: Secondary | ICD-10-CM

## 2023-03-22 DIAGNOSIS — I10 Essential (primary) hypertension: Secondary | ICD-10-CM | POA: Diagnosis not present

## 2023-03-22 DIAGNOSIS — F319 Bipolar disorder, unspecified: Secondary | ICD-10-CM | POA: Diagnosis not present

## 2023-03-22 DIAGNOSIS — G4733 Obstructive sleep apnea (adult) (pediatric): Secondary | ICD-10-CM | POA: Diagnosis not present

## 2023-03-22 DIAGNOSIS — F411 Generalized anxiety disorder: Secondary | ICD-10-CM

## 2023-03-22 DIAGNOSIS — R600 Localized edema: Secondary | ICD-10-CM | POA: Diagnosis not present

## 2023-03-22 DIAGNOSIS — L819 Disorder of pigmentation, unspecified: Secondary | ICD-10-CM

## 2023-03-22 NOTE — Patient Instructions (Addendum)
 [facility] at [facility] [address] [place], [place] [address] [phone]

## 2023-03-22 NOTE — Progress Notes (Signed)
 New Patient Office Visit  Subjective    Patient ID: Deanna Powell, female    DOB: 1980-03-31  Age: 42 y.o. MRN: [id number]  CC:  Chief Complaint  Patient presents with   Establish Care    Patient is here to establish care with a new PCP,  Patient would like to discuss dark circle under eyes, swelling feet , as well as constipation    HPI Deanna Powell presents to establish care.  Bipolar Pt reports she has hx of Bipolar disorder. She reports she hasn't been able to sleep for a long time. She reports she was taking Lamictal 25mg  2 tabs po daily and Abilify 10mg  daily but has been out of this. She reports she would like referral for Psychiatry for management.  She reports those medicines didn't help. She has tried Depakote but it caused weight gain. She also tried Seroquel for sleep but this caused her to stay up. She has tried Trazodone but made her pull her hair out.  Dark circles around her eyes She reports it's been present for the last few months. She does report hx of asthma and allergies along with not sleeping well for years.  Feet swelling Pt reports she's had feet swelling for the last few months. She does report being diagnosed with OSA but she never got the CPAP for it. She had her sleep study with pulmonology. She has hx of uncontrolled HTN. Recently seen by cardiologist who added medicines to her bp regimen. She is yet to get this picked up yet.  Outpatient Encounter Medications as of 03/22/2023  Medication Sig   albuterol (VENTOLIN HFA) 108 (90 Base) MCG/ACT inhaler INHALE 1-2 PUFFS BY MOUTH EVERY 6 HOURS AS NEEDED FOR WHEEZE OR SHORTNESS OF BREATH   amLODipine-valsartan (EXFORGE) 10-320 MG tablet Take 1 tablet by mouth daily.   fluticasone-salmeterol (ADVAIR) 100-50 MCG/ACT AEPB Inhale 1 puff into the lungs 2 (two) times daily.   hydrALAZINE (APRESOLINE) 50 MG tablet Take 1 tablet (50 mg total) by mouth 3 (three) times daily.   hydrOXYzine (VISTARIL) 25 MG capsule TAKE ONE  CAPSULE BY MOUTH IN THE MORNING AND TWO AT BED TIME   labetalol (NORMODYNE) 200 MG tablet Take 1 tablet (200 mg total) by mouth 2 (two) times daily.   lamoTRIgine (LAMICTAL) 25 MG tablet Take one tablet by mouth daily for one week and then 2 tab daily   montelukast (SINGULAIR) 10 MG tablet Take 1 tablet (10 mg total) by mouth at bedtime.   Multiple Vitamin (MULTIVITAMIN) capsule Take 1 capsule by mouth daily.   senna-docusate (SENOKOT-S) 8.6-50 MG tablet Take 1 tablet by mouth at bedtime.   ARIPiprazole (ABILIFY) 10 MG tablet Take 1 tablet (10 mg total) by mouth daily. (Patient not taking: Reported on 03/22/2023)   fluticasone (FLONASE) 50 MCG/ACT nasal spray Place 2 sprays into both nostrils daily. (Patient not taking: Reported on 03/22/2023)   spironolactone-hydrochlorothiazide (ALDACTAZIDE) 25-25 MG tablet Take 1 tablet by mouth every morning. (Patient not taking: Reported on 03/22/2023)   [DISCONTINUED] dextromethorphan 15 MG/5ML syrup Take 10 mLs (30 mg total) by mouth 4 (four) times daily as needed for cough.   No facility-administered encounter medications on file as of 03/22/2023.    Past Medical History:  Diagnosis Date   Anxiety    Bipolar 1 disorder (HCC)    PTSD (post-traumatic stress disorder)     Past Surgical History:  Procedure Laterality Date   ABDOMINAL HYSTERECTOMY     Fibroids/heavy menses  Family History  Problem Relation Age of Onset   Diabetes Father    Cirrhosis Father     Social History   Socioeconomic History   Marital status: Single    Spouse name: 2   Number of children: Not on file   Years of education: Not on file   Highest education level: Not on file  Occupational History   Not on file  Tobacco Use   Smoking status: Never    Passive exposure: Never   Smokeless tobacco: Never  Vaping Use   Vaping status: Never Used  Substance and Sexual Activity   Alcohol use: Not Currently    Comment: occasionally   Drug use: No   Sexual activity: Not  Currently    Birth control/protection: Surgical  Other Topics Concern   Not on file  Social History Narrative   Not on file   Social Drivers of Health   Financial Resource Strain: Not on file  Food Insecurity: Not on file  Transportation Needs: Not on file  Physical Activity: Not on file  Stress: Not on file (01/05/2023)  Social Connections: Not on file  Intimate Partner Violence: Not on file    Review of Systems  HENT:         Dark eye circles  Cardiovascular:  Positive for leg swelling.  Psychiatric/Behavioral:  Positive for depression. The patient is nervous/anxious and has insomnia.   All other systems reviewed and are negative.      Objective    BP (!) 151/92   Pulse 85   Temp (!) 97.5 F (36.4 C) (Oral)   Resp 18   Ht 5\' 5"  (1.651 m)   Wt 218 lb 3.2 oz (99 kg)   SpO2 100%   BMI 36.31 kg/m   Physical Exam Vitals and nursing note reviewed.  Constitutional:      Appearance: Normal appearance. She is normal weight.  HENT:     Head: Normocephalic and atraumatic.     Right Ear: Tympanic membrane, ear canal and external ear normal.     Left Ear: Tympanic membrane, ear canal and external ear normal.     Nose: Nose normal.     Mouth/Throat:     Mouth: Mucous membranes are moist.     Pharynx: Oropharynx is clear.  Eyes:     Conjunctiva/sclera: Conjunctivae normal.     Pupils: Pupils are equal, round, and reactive to light.     Comments: Hyperpigmentation around eyes bilaterally  Cardiovascular:     Rate and Rhythm: Normal rate and regular rhythm.     Pulses: Normal pulses.     Heart sounds: Normal heart sounds.  Pulmonary:     Effort: Pulmonary effort is normal.     Breath sounds: Normal breath sounds.  Abdominal:     General: Abdomen is flat. Bowel sounds are normal.  Skin:    General: Skin is warm.     Capillary Refill: Capillary refill takes less than 2 seconds.  Neurological:     General: No focal deficit present.     Mental Status: She is alert  and oriented to person, place, and time. Mental status is at baseline.  Psychiatric:        Mood and Affect: Mood normal.        Behavior: Behavior normal.        Thought Content: Thought content normal.        Judgment: Judgment normal.       Assessment & Plan:  Problem List Items Addressed This Visit   None  Encounter to establish care with new doctor  Bipolar 1 disorder, depressed (HCC) -     Ambulatory referral to Behavioral Health  Hypertension, unspecified type  Peripheral edema -     TSH + free T4  OSA (obstructive sleep apnea)  Generalized anxiety disorder -     Ambulatory referral to Behavioral Health  Dark circle under eye  Pt with chronic bipolar disorder. Mentions previous side effects to multiple bipolar medicines. Referral to Psychiatry placed today for management options. Advised pt that her feet swelling is likely multifactorial I.e HTN, diet, OSA untreated. Given her # to contact pulmonologist for referral to get CPAP as she had her sleep study through their office. Will also screen thyroid today due to feet swelling. Advised to get her HTN medicines that was recently prescribed for her.  Explained to pt that her dark eye circles could be also multifactorial with her OSA and hx of insomnia, asthma/allergies.   No follow-ups on file.   Suzan Slick, MD

## 2023-03-23 ENCOUNTER — Encounter: Payer: Self-pay | Admitting: Family Medicine

## 2023-03-23 LAB — TSH+FREE T4
Free T4: 1.18 ng/dL (ref 0.82–1.77)
TSH: 2.54 u[IU]/mL (ref 0.450–4.500)

## 2023-04-04 ENCOUNTER — Encounter: Admitting: Family Medicine

## 2023-04-10 ENCOUNTER — Encounter: Payer: Self-pay | Admitting: Cardiology

## 2023-04-10 ENCOUNTER — Ambulatory Visit (HOSPITAL_COMMUNITY): Payer: BC Managed Care – PPO | Attending: Cardiology

## 2023-04-10 DIAGNOSIS — R9431 Abnormal electrocardiogram [ECG] [EKG]: Secondary | ICD-10-CM | POA: Insufficient documentation

## 2023-04-10 DIAGNOSIS — I10 Essential (primary) hypertension: Secondary | ICD-10-CM | POA: Insufficient documentation

## 2023-04-10 LAB — ECHOCARDIOGRAM COMPLETE
Area-P 1/2: 4.02 cm2
S' Lateral: 2.4 cm

## 2023-04-10 NOTE — Progress Notes (Signed)
 Echocardiogram is essentially normal with very mild diastolic dysfunction related to hypertension and mild LVH.  Diastolic function means the heart has to relax to receive the blood so it can pump the blood out. If relaxation is impaired, then the blood coming to the heart is restricted and can cause dyspnea and reduced functional capacity and fluid build up. Exercise, weight loss, control of BP, salt restriction will improve this.  LVH = Left ventricular hypertrophy: thickening of heart muscle probably due to hypertension. Can happen in valve leaking issues, obesity, diabetes.  Sometimes atheletes or if you exercise daily then it is not abnormal.

## 2023-04-16 ENCOUNTER — Encounter: Payer: Self-pay | Admitting: Family Medicine

## 2023-04-16 ENCOUNTER — Ambulatory Visit (HOSPITAL_COMMUNITY): Admitting: Family

## 2023-04-16 ENCOUNTER — Ambulatory Visit (INDEPENDENT_AMBULATORY_CARE_PROVIDER_SITE_OTHER): Admitting: Family Medicine

## 2023-04-16 VITALS — BP 154/110 | HR 89 | Temp 97.9°F | Resp 18 | Ht 65.0 in | Wt 221.0 lb

## 2023-04-16 DIAGNOSIS — Z0000 Encounter for general adult medical examination without abnormal findings: Secondary | ICD-10-CM

## 2023-04-16 DIAGNOSIS — Z13220 Encounter for screening for lipoid disorders: Secondary | ICD-10-CM

## 2023-04-16 DIAGNOSIS — R7302 Impaired glucose tolerance (oral): Secondary | ICD-10-CM

## 2023-04-16 DIAGNOSIS — Z136 Encounter for screening for cardiovascular disorders: Secondary | ICD-10-CM

## 2023-04-16 DIAGNOSIS — I10 Essential (primary) hypertension: Secondary | ICD-10-CM | POA: Diagnosis not present

## 2023-04-16 NOTE — Progress Notes (Signed)
 Complete physical exam  Patient: Deanna Powell   DOB: 1980/10/04   43 y.o. Female  MRN: [id number]  Subjective:    Chief Complaint  Patient presents with   Annual Exam    Deanna Powell is a 43 y.o. female who presents today for a complete physical exam. She reports consuming a general diet.  Walking 2 x a day  She generally feels well. She reports sleeping fairly well. She does not have additional problems to discuss today.    Most recent fall risk assessment:    01/31/2023    3:10 PM  Fall Risk   Falls in the past year? 0  Number falls in past yr: 0  Injury with Fall? 0     Most recent depression screenings:    03/22/2023   11:14 AM 01/31/2023    3:10 PM  PHQ 2/9 Scores  PHQ - 2 Score 6 2  PHQ- 9 Score 24 10    Vision:Not within last year   Patient Active Problem List   Diagnosis Date Noted   Generalized anxiety disorder 09/05/2021   PTSD (post-traumatic stress disorder) 09/05/2021   Excessive daytime sleepiness 03/17/2021   Moderate obesity 03/17/2021   COVID-19 03/27/2020   Bipolar disorder (HCC) 06/28/2016   Candidal intertrigo 11/05/2015   Bipolar 1 disorder, depressed (HCC) 09/24/2015   Episodic mood disorder (HCC) 01/06/2015   Past Medical History:  Diagnosis Date   Anxiety    Bipolar 1 disorder (HCC)    PTSD (post-traumatic stress disorder)    Past Surgical History:  Procedure Laterality Date   ABDOMINAL HYSTERECTOMY     Fibroids/heavy menses   Social History   Tobacco Use   Smoking status: Never    Passive exposure: Never   Smokeless tobacco: Never  Vaping Use   Vaping status: Never Used  Substance Use Topics   Alcohol use: Not Currently    Comment: occasionally   Drug use: No   Family Status  Relation Name Status   Mother  Alive   Father  Alive  No partnership data on file   No Known Allergies    Patient Care Team: Suzan Slick, MD as PCP - General (Family Medicine) Yates Decamp, MD as PCP - Cardiology (Cardiology) Tomma Lightning, MD as Consulting Physician (Pulmonary Disease)   Outpatient Medications Prior to Visit  Medication Sig   albuterol (VENTOLIN HFA) 108 (90 Base) MCG/ACT inhaler INHALE 1-2 PUFFS BY MOUTH EVERY 6 HOURS AS NEEDED FOR WHEEZE OR SHORTNESS OF BREATH   amLODipine-valsartan (EXFORGE) 10-320 MG tablet Take 1 tablet by mouth daily.   ARIPiprazole (ABILIFY) 10 MG tablet Take 1 tablet (10 mg total) by mouth daily. (Patient not taking: Reported on 03/22/2023)   fluticasone (FLONASE) 50 MCG/ACT nasal spray Place 2 sprays into both nostrils daily. (Patient not taking: Reported on 03/22/2023)   fluticasone-salmeterol (ADVAIR) 100-50 MCG/ACT AEPB Inhale 1 puff into the lungs 2 (two) times daily.   hydrALAZINE (APRESOLINE) 50 MG tablet Take 1 tablet (50 mg total) by mouth 3 (three) times daily.   hydrOXYzine (VISTARIL) 25 MG capsule TAKE ONE CAPSULE BY MOUTH IN THE MORNING AND TWO AT BED TIME   labetalol (NORMODYNE) 200 MG tablet Take 1 tablet (200 mg total) by mouth 2 (two) times daily.   lamoTRIgine (LAMICTAL) 25 MG tablet Take one tablet by mouth daily for one week and then 2 tab daily   montelukast (SINGULAIR) 10 MG tablet Take 1 tablet (10 mg total) by mouth at  bedtime.   Multiple Vitamin (MULTIVITAMIN) capsule Take 1 capsule by mouth daily.   senna-docusate (SENOKOT-S) 8.6-50 MG tablet Take 1 tablet by mouth at bedtime.   spironolactone-hydrochlorothiazide (ALDACTAZIDE) 25-25 MG tablet Take 1 tablet by mouth every morning. (Patient not taking: Reported on 03/22/2023)   No facility-administered medications prior to visit.    Review of Systems  All other systems reviewed and are negative.        Objective:     BP (!) 166/107   Pulse 89   Temp 97.9 F (36.6 C) (Oral)   Resp 18   Ht 5\' 5"  (1.651 m)   Wt 221 lb (100.2 kg)   SpO2 100%   BMI 36.78 kg/m  BP Readings from Last 3 Encounters:  04/16/23 (!) 154/110  03/22/23 (!) 151/92  03/14/23 (!) 160/104      Physical Exam Vitals and  nursing note reviewed.  Constitutional:      Appearance: Normal appearance. She is normal weight.  HENT:     Head: Normocephalic and atraumatic.     Right Ear: Tympanic membrane, ear canal and external ear normal.     Left Ear: Tympanic membrane, ear canal and external ear normal.     Nose: Nose normal.     Mouth/Throat:     Mouth: Mucous membranes are moist.     Pharynx: Oropharynx is clear.  Eyes:     Conjunctiva/sclera: Conjunctivae normal.     Pupils: Pupils are equal, round, and reactive to light.  Cardiovascular:     Rate and Rhythm: Normal rate and regular rhythm.     Pulses: Normal pulses.     Heart sounds: Normal heart sounds.  Pulmonary:     Effort: Pulmonary effort is normal.     Breath sounds: Normal breath sounds.  Abdominal:     General: Abdomen is flat. Bowel sounds are normal.  Skin:    General: Skin is warm.     Capillary Refill: Capillary refill takes less than 2 seconds.  Neurological:     General: No focal deficit present.     Mental Status: She is alert and oriented to person, place, and time. Mental status is at baseline.  Psychiatric:        Mood and Affect: Mood normal.        Behavior: Behavior normal.        Thought Content: Thought content normal.        Judgment: Judgment normal.     No results found for any visits on 04/16/23. Last CBC Lab Results  Component Value Date   WBC 4.9 01/25/2023   HGB 11.3 (L) 01/25/2023   HCT 38.0 01/25/2023   MCV 71.6 (L) 01/25/2023   MCH 21.3 (L) 01/25/2023   RDW 16.9 (H) 01/25/2023   PLT 295 01/25/2023   Last metabolic panel Lab Results  Component Value Date   GLUCOSE 134 (H) 01/25/2023   NA 138 01/25/2023   K 3.4 (L) 01/25/2023   CL 106 01/25/2023   CO2 26 01/25/2023   BUN 9 01/25/2023   CREATININE 0.79 01/25/2023   GFRNONAA >60 01/25/2023   CALCIUM 8.8 (L) 01/25/2023   PROT 6.9 05/17/2022   ALBUMIN 4.1 02/02/2021   BILITOT 0.4 05/17/2022   ALKPHOS 56 02/02/2021   AST 11 05/17/2022   ALT 12  05/17/2022   ANIONGAP 6 01/25/2023   Last lipids Lab Results  Component Value Date   CHOL 175 05/17/2022   HDL 41 (L) 05/17/2022   LDLCALC  112 (H) 05/17/2022   TRIG 116 05/17/2022   CHOLHDL 4.3 05/17/2022   Last hemoglobin A1c No results found for: "HGBA1C"      Assessment & Plan:    Routine Health Maintenance and Physical Exam  Immunization History  Administered Date(s) Administered   Influenza, Seasonal, Injecte, Preservative Fre 11/26/2014, 01/31/2023   Influenza,inj,Quad PF,6+ Mos 11/26/2014, 12/15/2016   Tdap 02/02/2021    Health Maintenance  Topic Date Due   Pneumococcal Vaccine 95-8 Years old (1 of 2 - PCV) Never done   DTaP/Tdap/Td (2 - Td or Tdap) 02/03/2031   INFLUENZA VACCINE  Completed   Hepatitis C Screening  Completed   HIV Screening  Completed   HPV VACCINES  Aged Out   COVID-19 Vaccine  Discontinued    Discussed health benefits of physical activity, and encouraged her to engage in regular exercise appropriate for her age and condition.  Problem List Items Addressed This Visit   None  No follow-ups on file. Annual physical exam  Encounter for lipid screening for cardiovascular disease -     Lipid panel  Impaired glucose tolerance -     CBC with Differential/Platelet -     Comprehensive metabolic panel with GFR -     Hemoglobin A1c  Hypertension, unspecified type   Screening labs To monitor blood pressure and follow up in 2 weeks.     Suzan Slick, MD

## 2023-04-17 ENCOUNTER — Encounter: Payer: Self-pay | Admitting: Family Medicine

## 2023-04-17 LAB — LIPID PANEL
Chol/HDL Ratio: 3.5 ratio (ref 0.0–4.4)
Cholesterol, Total: 161 mg/dL (ref 100–199)
HDL: 46 mg/dL (ref >39)
LDL Chol Calc (NIH): 99 mg/dL (ref 0–99)
Triglycerides: 86 mg/dL (ref 0–149)
VLDL Cholesterol Cal: 16 mg/dL (ref 5–40)

## 2023-04-17 LAB — CBC WITH DIFFERENTIAL/PLATELET
Basophils Absolute: 0 10*3/uL (ref 0.0–0.2)
Basos: 1 %
EOS (ABSOLUTE): 0.2 10*3/uL (ref 0.0–0.4)
Eos: 3 %
Hematocrit: 37.4 % (ref 34.0–46.6)
Hemoglobin: 11.1 g/dL (ref 11.1–15.9)
Immature Grans (Abs): 0 10*3/uL (ref 0.0–0.1)
Immature Granulocytes: 0 %
Lymphocytes Absolute: 1.5 10*3/uL (ref 0.7–3.1)
Lymphs: 33 %
MCH: 21.6 pg — ABNORMAL LOW (ref 26.6–33.0)
MCHC: 29.7 g/dL — ABNORMAL LOW (ref 31.5–35.7)
MCV: 73 fL — ABNORMAL LOW (ref 79–97)
Monocytes Absolute: 0.3 10*3/uL (ref 0.1–0.9)
Monocytes: 6 %
Neutrophils Absolute: 2.7 10*3/uL (ref 1.4–7.0)
Neutrophils: 57 %
Platelets: 309 10*3/uL (ref 150–450)
RBC: 5.13 x10E6/uL (ref 3.77–5.28)
RDW: 16 % — ABNORMAL HIGH (ref 11.7–15.4)
WBC: 4.7 10*3/uL (ref 3.4–10.8)

## 2023-04-17 LAB — COMPREHENSIVE METABOLIC PANEL WITH GFR
ALT: 21 IU/L (ref 0–32)
AST: 19 IU/L (ref 0–40)
Albumin: 4.3 g/dL (ref 3.9–4.9)
Alkaline Phosphatase: 84 IU/L (ref 44–121)
BUN/Creatinine Ratio: 9 (ref 9–23)
BUN: 8 mg/dL (ref 6–24)
Bilirubin Total: 0.3 mg/dL (ref 0.0–1.2)
CO2: 24 mmol/L (ref 20–29)
Calcium: 9.2 mg/dL (ref 8.7–10.2)
Chloride: 103 mmol/L (ref 96–106)
Creatinine, Ser: 0.87 mg/dL (ref 0.57–1.00)
Globulin, Total: 2.5 g/dL (ref 1.5–4.5)
Glucose: 85 mg/dL (ref 70–99)
Potassium: 3.9 mmol/L (ref 3.5–5.2)
Sodium: 141 mmol/L (ref 134–144)
Total Protein: 6.8 g/dL (ref 6.0–8.5)
eGFR: 84 mL/min/{1.73_m2} (ref >59)

## 2023-04-17 LAB — HEMOGLOBIN A1C
Est. average glucose Bld gHb Est-mCnc: 117 mg/dL
Hgb A1c MFr Bld: 5.7 % — ABNORMAL HIGH (ref 4.8–5.6)

## 2023-04-20 ENCOUNTER — Telehealth (HOSPITAL_BASED_OUTPATIENT_CLINIC_OR_DEPARTMENT_OTHER): Admitting: Family

## 2023-04-20 DIAGNOSIS — F310 Bipolar disorder, current episode hypomanic: Secondary | ICD-10-CM | POA: Diagnosis not present

## 2023-04-20 MED ORDER — DOXEPIN HCL 50 MG PO CAPS: 50.0000 mg | ORAL_CAPSULE | Freq: Every day | ORAL | 2 refills | Status: DC
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
## 2023-04-27 ENCOUNTER — Other Ambulatory Visit: Payer: BC Managed Care – PPO

## 2023-05-01 ENCOUNTER — Ambulatory Visit (INDEPENDENT_AMBULATORY_CARE_PROVIDER_SITE_OTHER): Payer: BC Managed Care – PPO | Admitting: Family

## 2023-05-01 ENCOUNTER — Ambulatory Visit: Admitting: Family Medicine

## 2023-05-01 ENCOUNTER — Encounter: Payer: Self-pay | Admitting: Family

## 2023-05-01 ENCOUNTER — Other Ambulatory Visit: Payer: Self-pay

## 2023-05-01 VITALS — BP 138/96 | HR 78 | Temp 97.9°F | Resp 20 | Ht 65.0 in | Wt 218.6 lb

## 2023-05-01 DIAGNOSIS — I10 Essential (primary) hypertension: Secondary | ICD-10-CM | POA: Diagnosis not present

## 2023-05-01 DIAGNOSIS — F4310 Post-traumatic stress disorder, unspecified: Secondary | ICD-10-CM

## 2023-05-01 DIAGNOSIS — F319 Bipolar disorder, unspecified: Secondary | ICD-10-CM

## 2023-05-01 DIAGNOSIS — J4540 Moderate persistent asthma, uncomplicated: Secondary | ICD-10-CM

## 2023-05-01 DIAGNOSIS — Z23 Encounter for immunization: Secondary | ICD-10-CM

## 2023-05-01 DIAGNOSIS — G4733 Obstructive sleep apnea (adult) (pediatric): Secondary | ICD-10-CM

## 2023-05-01 MED ORDER — SPIRONOLACTONE-HCTZ 25-25 MG PO TABS
1.0000 | ORAL_TABLET | ORAL | 1 refills | Status: DC
  Filled 2023-05-01: qty 90, 90d supply, fill #0

## 2023-05-01 MED ORDER — LAMOTRIGINE 25 MG PO TABS
ORAL_TABLET | ORAL | 0 refills | Status: DC
  Filled 2023-05-01: qty 60, fill #0

## 2023-05-01 MED ORDER — LABETALOL HCL 200 MG PO TABS
200.0000 mg | ORAL_TABLET | Freq: Two times a day (BID) | ORAL | 1 refills | Status: DC
  Filled 2023-05-01: qty 60, 30d supply, fill #0

## 2023-05-01 NOTE — Progress Notes (Signed)
 Provider: Taym Twist FNP-C   Suzan Slick, MD  Patient Care Team: Suzan Slick, MD as PCP - General (Family Medicine) Yates Decamp, MD as PCP - Cardiology (Cardiology) Tomma Lightning, MD as Consulting Physician (Pulmonary Disease)  Extended Emergency Contact Information Primary Emergency Contact: [facility] Phone: [phone] Relation: Son Secondary Emergency Contact: [name],[name] Mobile Phone: [phone] Relation: Relative  Code Status:  Full Code  Goals of care: Advanced Directive information    05/01/2023    9:58 AM  Advanced Directives  Does Patient Have a Medical Advance Directive? No  Would patient like information on creating a medical advance directive? No - Patient declined     Chief Complaint  Patient presents with   Medical Management of Chronic Issues    6 month follow up.     Discussed the use of AI scribe software for clinical note transcription with the patient, who gave verbal consent to proceed.  History of Present Illness   Deanna Powell is a 43 year old female for a follow up visit for hypertension, Bipolar, PTSD, Asthma, generalized anxiety disorder and prediabetes who presents for a 16-month follow-up visit.  She is managing her hypertension with Labetalol 200 mg, spironolactone 25 mg, and hydrochlorothiazide 25 mg. Her blood pressure was previously in the 200s but has improved with medication. She did not take her blood pressure medication today due to waking up late and does not have a blood pressure monitor at home. Financial constraints have prevented her from picking up some of her medications.  She is engaged in a weight management program and is trying to incorporate exercise into her routine. Recent lab work showed a total cholesterol of 161, LDL of 99, and glucose of 85. Her A1c is 5.7, indicating prediabetes.  She has a history of asthma and uses Wixela, which effectively manages her symptoms. No recent asthma  exacerbations.  She has been diagnosed with sleep apnea but has not yet received her CPAP machine. She reports difficulty with sleep.  She is following up with a psychiatrist for bipolar disorder and was prescribed Lamictal, which she has not yet picked up due to financial constrain. She reports ongoing mood fluctuations.  She reports issues with constipation and has tried Miralax. She is hesitant to use prune juice as advised by her mother On chart, review she recently established care with another provider as PCP though states was referred by another provider but did not know that new provider was Primary Care office.  Past Medical History:  Diagnosis Date   Anxiety    Bipolar 1 disorder (HCC)    PTSD (post-traumatic stress disorder)    Past Surgical History:  Procedure Laterality Date   ABDOMINAL HYSTERECTOMY     Fibroids/heavy menses    No Known Allergies  Allergies as of 05/01/2023   No Known Allergies      Medication List        Accurate as of May 01, 2023  9:19 PM. If you have any questions, ask your nurse or doctor.          albuterol 108 (90 Base) MCG/ACT inhaler Commonly known as: VENTOLIN HFA INHALE 1-2 PUFFS BY MOUTH EVERY 6 HOURS AS NEEDED FOR WHEEZE OR SHORTNESS OF BREATH   amLODipine-valsartan 10-320 MG tablet Commonly known as: EXFORGE Take 1 tablet by mouth daily.   doxepin 50 MG capsule Commonly known as: SINEQUAN Take 1 capsule (50 mg total) by mouth at bedtime.   fluticasone 50 MCG/ACT nasal spray Commonly  known as: FLONASE Place 2 sprays into both nostrils daily.   fluticasone-salmeterol 100-50 MCG/ACT Aepb Commonly known as: ADVAIR Inhale 1 puff into the lungs 2 (two) times daily.   hydrALAZINE 50 MG tablet Commonly known as: APRESOLINE Take 1 tablet (50 mg total) by mouth 3 (three) times daily.   hydrOXYzine 25 MG capsule Commonly known as: VISTARIL TAKE ONE CAPSULE BY MOUTH IN THE MORNING AND TWO AT BED TIME   labetalol 200 MG  tablet Commonly known as: NORMODYNE Take 1 tablet (200 mg total) by mouth 2 (two) times daily.   lamoTRIgine 25 MG tablet Commonly known as: LaMICtal Take one tablet by mouth daily for one week and then 2 tab daily   montelukast 10 MG tablet Commonly known as: SINGULAIR Take 1 tablet (10 mg total) by mouth at bedtime.   multivitamin capsule Take 1 capsule by mouth daily.   senna-docusate 8.6-50 MG tablet Commonly known as: Senokot-S Take 1 tablet by mouth at bedtime.   spironolactone-hydrochlorothiazide 25-25 MG tablet Commonly known as: Aldactazide Take 1 tablet by mouth every morning.        Review of Systems  Constitutional:  Negative for appetite change, chills, fatigue, fever and unexpected weight change.  HENT:  Negative for congestion, dental problem, ear discharge, ear pain, facial swelling, hearing loss, nosebleeds, postnasal drip, rhinorrhea, sinus pressure, sinus pain, sneezing, sore throat, tinnitus and trouble swallowing.   Eyes:  Negative for pain, discharge, redness, itching and visual disturbance.  Respiratory:  Negative for cough, chest tightness, shortness of breath and wheezing.   Cardiovascular:  Negative for chest pain, palpitations and leg swelling.  Gastrointestinal:  Negative for abdominal distention, abdominal pain, blood in stool, constipation, diarrhea, nausea and vomiting.  Endocrine: Negative for cold intolerance, heat intolerance, polydipsia, polyphagia and polyuria.  Genitourinary:  Negative for difficulty urinating, dysuria, flank pain, frequency and urgency.  Musculoskeletal:  Negative for arthralgias, back pain, gait problem, joint swelling, myalgias, neck pain and neck stiffness.  Skin:  Negative for color change, pallor, rash and wound.  Neurological:  Negative for dizziness, syncope, speech difficulty, weakness, light-headedness, numbness and headaches.  Hematological:  Does not bruise/bleed easily.  Psychiatric/Behavioral:  Negative for  agitation, behavioral problems, confusion, hallucinations, self-injury, sleep disturbance and suicidal ideas. The patient is not nervous/anxious.     Immunization History  Administered Date(s) Administered   Influenza, Seasonal, Injecte, Preservative Fre 11/26/2014, 01/31/2023   Influenza,inj,Quad PF,6+ Mos 11/26/2014, 12/15/2016   PNEUMOCOCCAL CONJUGATE-20 05/01/2023   Tdap 02/02/2021   Pertinent  Health Maintenance Due  Topic Date Due   INFLUENZA VACCINE  08/17/2023      05/17/2022    9:35 AM 09/14/2022   11:17 AM 09/28/2022    9:23 AM 01/31/2023    3:10 PM 05/01/2023    9:56 AM  Fall Risk  Falls in the past year? 0 0 0 0 0  Was there an injury with Fall? 0 0 0 0 0  Fall Risk Category Calculator 0 0 0 0 0  Patient at Risk for Falls Due to No Fall Risks No Fall Risks No Fall Risks  No Fall Risks  Fall risk Follow up Falls evaluation completed Falls evaluation completed;Education provided;Falls prevention discussed Falls evaluation completed;Education provided;Falls prevention discussed  Falls evaluation completed   Functional Status Survey:    Vitals:   05/01/23 1003 05/01/23 1012 05/01/23 1107  BP: (!) 150/102 (!) 142/90 (!) 138/96  Pulse: 78    Resp: 20    Temp: 97.9 F (36.6  C)    SpO2: 98%    Weight: 218 lb 9.6 oz (99.2 kg)    Height: 5\' 5"  (1.651 m)     Body mass index is 36.38 kg/m. Physical Exam VITALS: BP- 150/102 clonidine given B/p rechecked down to 138/96 GENERAL: Alert, cooperative, well developed, no acute distress. HEENT: Normocephalic, normal oropharynx, moist mucous membranes, tympanic membranes normal bilaterally, nose normal. CHEST: Clear to auscultation bilaterally, no wheezes, rhonchi, or crackles. CARDIOVASCULAR: Normal heart rate and rhythm, S1 and S2 normal without murmurs. ABDOMEN: Soft, non-tender, non-distended, without organomegaly, normal bowel sounds. EXTREMITIES: No cyanosis or edema. NEUROLOGICAL: Cranial nerves grossly intact, moves all  extremities without gross motor or sensory deficit.  SKIN: No rash,no lesion or erythema   PSYCHIATRY/BEHAVIORAL: Mood stable    Labs reviewed: Recent Labs    05/17/22 0958 01/25/23 1610 04/16/23 1535  NA 138 138 141  K 3.6 3.4* 3.9  CL 104 106 103  CO2 26 26 24   GLUCOSE 91 134* 85  BUN 9 9 8   CREATININE 0.82 0.79 0.87  CALCIUM 9.0 8.8* 9.2   Recent Labs    05/17/22 0958 04/16/23 1535  AST 11 19  ALT 12 21  ALKPHOS  --  84  BILITOT 0.4 0.3  PROT 6.9 6.8  ALBUMIN  --  4.3   Recent Labs    05/17/22 0958 01/25/23 1610 04/16/23 1535  WBC 4.7 4.9 4.7  NEUTROABS 2,430  --  2.7  HGB 11.3* 11.3* 11.1  HCT 37.1 38.0 37.4  MCV 72.0* 71.6* 73*  PLT 306 295 309   Lab Results  Component Value Date   TSH 2.540 03/22/2023   Lab Results  Component Value Date   HGBA1C 5.7 (H) 04/16/2023   Lab Results  Component Value Date   CHOL 161 04/16/2023   HDL 46 04/16/2023   LDLCALC 99 04/16/2023   TRIG 86 04/16/2023   CHOLHDL 3.5 04/16/2023    Significant Diagnostic Results in last 30 days:  ECHOCARDIOGRAM COMPLETE Result Date: 04/10/2023    ECHOCARDIOGRAM REPORT   Patient Name:   [facility] Date of Exam: 04/10/2023 Medical Rec #:  [id number]     Height:       65.0 in Accession #:    [id number]    Weight:       218.2 lb Date of Birth:  May 18, 1980      BSA:          2.053 m Patient Age:    42 years      BP:           148/86 mmHg Patient Gender: F             HR:           76 bpm. Exam Location:  [address] Procedure: 2D Echo, Cardiac Doppler and Color Doppler (Both Spectral and Color            Flow Doppler were utilized during procedure). Indications:    R94.31 Abnormal EKG  History:        Patient has no prior history of Echocardiogram examinations.  Sonographer:    Sedonia Small Rodgers-Jones RDCS Referring Phys: [id number] Yates Decamp IMPRESSIONS  1. Left ventricular ejection fraction, by estimation, is 60 to 65%. The left ventricle has normal function. The left ventricle has no regional  wall motion abnormalities. There is mild concentric left ventricular hypertrophy. Left ventricular diastolic parameters are consistent with Grade I diastolic dysfunction (impaired relaxation).  2. Right ventricular systolic function  is normal. The right ventricular size is normal. Tricuspid regurgitation signal is inadequate for assessing PA pressure.  3. The mitral valve is normal in structure. No evidence of mitral valve regurgitation. No evidence of mitral stenosis.  4. The aortic valve is tricuspid. Aortic valve regurgitation is not visualized. No aortic stenosis is present.  5. The inferior vena cava is normal in size with greater than 50% respiratory variability, suggesting right atrial pressure of 3 mmHg. FINDINGS  Left Ventricle: Left ventricular ejection fraction, by estimation, is 60 to 65%. The left ventricle has normal function. The left ventricle has no regional wall motion abnormalities. The left ventricular internal cavity size was normal in size. There is  mild concentric left ventricular hypertrophy. Left ventricular diastolic parameters are consistent with Grade I diastolic dysfunction (impaired relaxation). Right Ventricle: The right ventricular size is normal. No increase in right ventricular wall thickness. Right ventricular systolic function is normal. Tricuspid regurgitation signal is inadequate for assessing PA pressure. Left Atrium: Left atrial size was normal in size. Right Atrium: Right atrial size was normal in size. Pericardium: There is no evidence of pericardial effusion. Mitral Valve: The mitral valve is normal in structure. No evidence of mitral valve regurgitation. No evidence of mitral valve stenosis. Tricuspid Valve: The tricuspid valve is normal in structure. Tricuspid valve regurgitation is not demonstrated. Aortic Valve: The aortic valve is tricuspid. Aortic valve regurgitation is not visualized. No aortic stenosis is present. Pulmonic Valve: The pulmonic valve was normal in  structure. Pulmonic valve regurgitation is not visualized. Aorta: The aortic root is normal in size and structure. Venous: The inferior vena cava is normal in size with greater than 50% respiratory variability, suggesting right atrial pressure of 3 mmHg. IAS/Shunts: No atrial level shunt detected by color flow Doppler.  LEFT VENTRICLE PLAX 2D LVIDd:         3.50 cm   Diastology LVIDs:         2.40 cm   LV e' medial:    5.76 cm/s LV PW:         1.30 cm   LV E/e' medial:  16.0 LV IVS:        1.20 cm   LV e' lateral:   8.81 cm/s LVOT diam:     1.70 cm   LV E/e' lateral: 10.5 LV SV:         63 LV SV Index:   31 LVOT Area:     2.27 cm  RIGHT VENTRICLE             IVC RV Basal diam:  3.00 cm     IVC diam: 1.50 cm RV S prime:     12.10 cm/s TAPSE (M-mode): 1.9 cm LEFT ATRIUM             Index        RIGHT ATRIUM           Index LA diam:        4.50 cm 2.19 cm/m   RA Area:     11.20 cm LA Vol (A2C):   47.0 ml 22.90 ml/m  RA Volume:   24.40 ml  11.89 ml/m LA Vol (A4C):   45.6 ml 22.21 ml/m LA Biplane Vol: 47.0 ml 22.90 ml/m  AORTIC VALVE LVOT Vmax:   133.50 cm/s LVOT Vmean:  92.500 cm/s LVOT VTI:    0.278 m  AORTA Ao Root diam: 3.20 cm Ao Asc diam:  3.00 cm MITRAL VALVE MV Area (PHT): 4.02  cm    SHUNTS MV Decel Time: 189 msec    Systemic VTI:  0.28 m MV E velocity: 92.25 cm/s  Systemic Diam: 1.70 cm MV A velocity: 86.50 cm/s MV E/A ratio:  1.07 Dalton McleanMD Electronically signed by Archer Bear Signature Date/Time: 04/10/2023/11:21:59 AM    Final     Assessment/Plan  Hypertension Hypertension remains uncontrolled with current medication regimen. Blood pressure readings today were 150/102 mmHg and 142/90 mmHg. She did not take blood pressure medication today due to waking up late. There is a concern about medication adherence due to financial constraints, as some medications have not been picked up. She has not picked up labetalol, spironolactone, hydrochlorothiazide, or Lamictal due to financial issues.  She is currently taking Exforge and hydralazine. - Administer clonidine to acutely lower blood pressure - Refer to [employer] for assistance with medication costs - Ensure access to all prescribed antihypertensive medications, including labetalol, spironolactone, hydrochlorothiazide, Exforge (amlodipine and valsartan), and hydralazine - Encourage regular home blood pressure monitoring  Bipolar Disorder She reports ongoing mood fluctuations. Recently started seeing a psychiatrist and was prescribed medication, which has not yet been picked up. Discussed the importance of medication adherence for mood stabilization. - Encourage adherence to psychiatric medication regimen - Continue follow-up with psychiatrist  Prediabetes Hemoglobin A1c is 5.7%, indicating prediabetes. She is engaged in weight management and exercise, which are positive steps towards glycemic control. Emphasized the importance of lifestyle modifications to prevent progression to diabetes. - Continue weight management program - Encourage regular physical activity  Asthma Asthma is well-controlled with current medication regimen. She reports no recent asthma exacerbations since starting Wixela. - Continue Wixela as prescribed  Sleep Apnea She has not yet received CPAP machine for sleep apnea management. Discussed the importance of CPAP in managing sleep apnea and improving quality of life. - Follow up on CPAP machine acquisition  Constipation She reports ongoing constipation. Has not been using any specific treatment regularly. Resistant to using prunes. Discussed natural remedies and the benefits of prune juice. - Recommend prune juice without added sugar as a natural remedy for constipation  General Health Maintenance She is due for pneumonia vaccination. Discussed benefits of vaccination in preventing pneumococcal disease. - Administer pneumonia vaccine  Primary Care Provider Decision She needs to  decide on primary care provider due to insurance constraints. Current primary care is with another provider, but she expresses desire to continue care here. Discussed the need to resolve primary care provider designation to ensure continuity of care. - Discuss primary care provider decision with her - Schedule regular six-month follow-up appointment   Family/ staff Communication: Reviewed plan of care with patient verbalized understanding   Labs/tests ordered: None   Next Appointment : Return in about 4 months (around 08/31/2023) for medical mangement of chronic issues. 2 weeks for Blood pressure.   Spent 35 minutes of Face to face and non-face to face with patient  >50% time spent counseling; reviewing medical record; tests; labs; documentation and developing future plan of care.   Estil Heman, NP

## 2023-05-03 ENCOUNTER — Telehealth: Payer: Self-pay | Admitting: Pharmacist

## 2023-05-03 DIAGNOSIS — I10 Essential (primary) hypertension: Secondary | ICD-10-CM

## 2023-05-03 NOTE — Progress Notes (Signed)
   05/03/2023  Patient ID: Deanna Powell, female   DOB: 06-21-1980, 43 y.o.   MRN: [id number]  Patient was called about hypertensive therapy per PCP referral.  Unfortunately, she did not answer and her voicemail was full.  Called [facility] to inquire as well.  Had to leave a message.   Geronimo Krabbe, PharmD, BCACP Clinical Pharmacist [phone]

## 2023-05-04 ENCOUNTER — Telehealth: Payer: Self-pay | Admitting: Pharmacist

## 2023-05-04 DIAGNOSIS — Z79899 Other long term (current) drug therapy: Secondary | ICD-10-CM

## 2023-05-04 NOTE — Progress Notes (Signed)
   05/04/2023  Patient ID: Deanna Powell, female   DOB: [birth year], 43 y.o.   MRN: [id number]  Patient was called to complete a medication review per referral.  Unfortunately, she did not answer the phone. HIPAA compliant message could not be left on her voicemail because the mailbox was full.  Today's call was the second unsuccessful phone call.  Plan:  Will call the patient back in 5-7 business days.   Cassius DOROTHA Brought, PharmD, [facility] Clinical Pharmacist [facility] [phone]

## 2023-05-08 ENCOUNTER — Ambulatory Visit (INDEPENDENT_AMBULATORY_CARE_PROVIDER_SITE_OTHER): Admitting: Family Medicine

## 2023-05-08 ENCOUNTER — Encounter: Payer: Self-pay | Admitting: Family Medicine

## 2023-05-08 VITALS — BP 156/92 | HR 86 | Temp 98.2°F | Resp 18 | Ht 65.0 in | Wt 221.3 lb

## 2023-05-08 DIAGNOSIS — Z599 Problem related to housing and economic circumstances, unspecified: Secondary | ICD-10-CM

## 2023-05-08 DIAGNOSIS — I10 Essential (primary) hypertension: Secondary | ICD-10-CM

## 2023-05-08 NOTE — Progress Notes (Addendum)
 Established Patient Office Visit  Subjective   Patient ID: Deanna Powell, female    DOB: 1980/05/27  Age: 43 y.o. MRN: [id number]  Chief Complaint  Patient presents with   Follow-up    Patient is here for a 2 week follow up for HTN    HPI  Hypertension Pt reports she just got her medications a few days due to financial constraints. She is unsure what she took this morning when asked, she states all the ones you sent in. Pt was seen by her previous PCP on 4/15 and was refilled multiple medicines. I advised pt I never prescribed her any medicines but I advised her during the initial visit to continue her medicines that was recently prescribed and to follow up with me today for blood pressure.  Pt is on her phone during visit looking at what she's taking.    Review of Systems  All other systems reviewed and are negative.     Objective:     BP (!) 156/92   Pulse 86   Temp 98.2 F (36.8 C) (Oral)   Resp 18   Ht 5\' 5"  (1.651 m)   Wt 221 lb 4.8 oz (100.4 kg)   SpO2 99%   BMI 36.83 kg/m  BP Readings from Last 3 Encounters:  05/08/23 (!) 156/92  05/01/23 (!) 138/96  04/16/23 (!) 154/110      Physical Exam Vitals and nursing note reviewed.  Constitutional:      Appearance: Normal appearance. She is normal weight.  HENT:     Head: Normocephalic and atraumatic.     Right Ear: External ear normal.     Left Ear: External ear normal.     Nose: Nose normal.     Mouth/Throat:     Mouth: Mucous membranes are moist.     Pharynx: Oropharynx is clear.  Eyes:     Conjunctiva/sclera: Conjunctivae normal.     Pupils: Pupils are equal, round, and reactive to light.  Cardiovascular:     Rate and Rhythm: Normal rate.  Pulmonary:     Effort: Pulmonary effort is normal.  Skin:    General: Skin is warm.     Capillary Refill: Capillary refill takes less than 2 seconds.  Neurological:     General: No focal deficit present.     Mental Status: She is alert and oriented to person,  place, and time. Mental status is at baseline.  Psychiatric:        Mood and Affect: Mood normal.        Behavior: Behavior normal.        Thought Content: Thought content normal.        Judgment: Judgment normal.     No results found for any visits on 05/08/23.     The 10-year ASCVD risk score (Arnett DK, et al., 2019) is: 6.4%    Assessment & Plan:   Problem List Items Addressed This Visit   None Visit Diagnoses       Hypertension, unspecified type    -  Primary     Financial difficulties         Hypertension, unspecified type  Financial difficulties    Pt with uncontrolled blood pressure. Still haven't received all of her bp medicines. Reviewed medication list with pt and she reports she gets confused on the medicines ie hydroxyzine  and hydralazine . Printed med list for pt and wrote down indications of each medicine for pt. To try to get medications as soon  as she can but financial restraints make medication compliance difficult.  To follow up in 4 weeks.  Return in about 4 weeks (around 06/05/2023) for Hypertension.    Manette Section, MD Total time spent with patient today 25 minutes. This includes reviewing records, evaluating the patient and coordinating care. Face-to-face time >50%.

## 2023-05-08 NOTE — Addendum Note (Signed)
 Addended by: Manette Section on: 05/08/2023 09:10 AM   Modules accepted: Level of Service

## 2023-05-09 ENCOUNTER — Encounter (HOSPITAL_COMMUNITY): Payer: Self-pay

## 2023-05-09 ENCOUNTER — Ambulatory Visit (HOSPITAL_COMMUNITY): Admitting: Family

## 2023-05-10 ENCOUNTER — Ambulatory Visit: Admitting: Family

## 2023-05-10 NOTE — Progress Notes (Signed)
   This encounter was created in error - please disregard. No show

## 2023-05-14 ENCOUNTER — Telehealth: Payer: Self-pay | Admitting: Pharmacist

## 2023-05-14 DIAGNOSIS — I10 Essential (primary) hypertension: Secondary | ICD-10-CM

## 2023-05-14 NOTE — Progress Notes (Signed)
 05/14/2023 Name: Deanna Powell MRN: [id number] DOB: 1980/07/08  Chief Complaint  Patient presents with   Medication Management    Medication Adherence     Deanna Powell is a 43 y.o. year old female who presented for a telephone visit.   They were referred to the pharmacist by their PCP for assistance in managing medication access.    Subjective:  Care Team: Primary Care Provider: Manette Section, MD ; Next Scheduled Visit: 06/05/23 Gregor Learned, NP - Internal Medicine 08/31/23 Cardiology Dr. Berry Bristol 05/18/23  Medication Access/Adherence  Current [facility]acy:  [id number] [place], [place] - [address] AT CORNER OF [facility] [address] [place] [address] Phone: [phone] Fax: [phone]  [id number] [place], [place] - [address] AT [facility] [address] [place] [address] Phone: [phone] Fax: [phone]  [facility] [address] [place] [address] Phone: [phone] Fax: [phone]   Patient reports affordability concerns with their medications: Yes  Patient reports access/transportation concerns to their pharmacy: Yes  Patient reports adherence concerns with their medications:  Yes  Due to cost   Hypertension:  Current medications:   Patient has not been taking any of the her hypertensive medications due to cost.  [employer] and [employer] were called her on behalf while on conference call.  It was determined that she met her $4000 deductible at the end of February and will now have a $0 copay for her medications.  The representative ran test claims for both hydralazine  50mg  and Labetalol  200mg  as both of those were recently sent to a pharmacy fo the patient.  Patient [facility] [address]   Medications previously tried:   Amlodipine /Olmesartan 10/320 daily Hydralazine  50 mg 1 1tablet three times daily Labatel9ol 200 mg 1 tablet twice daily Spironolactone -HCTZ 25-25 1 tablet every morning.  Objective:  Lab Results  Component Value Date   HGBA1C 5.7 (H) 04/16/2023    Lab Results  Component Value Date   CREATININE 0.87 04/16/2023   BUN 8 04/16/2023   NA 141 04/16/2023   K 3.9 04/16/2023   CL 103 04/16/2023   CO2 24 04/16/2023    Lab Results  Component Value Date   CHOL 161 04/16/2023   HDL 46 04/16/2023   LDLCALC 99 04/16/2023   TRIG 86 04/16/2023   CHOLHDL 3.5 04/16/2023    Medications Reviewed Today     Reviewed by Geronimo Krabbe, RPH (Pharmacist) on 05/14/23 at 1324  Med List Status: <None>   Medication Order Taking? Sig Documenting Provider Last Dose Status Informant  albuterol  (VENTOLIN  HFA) 108 (90 Base) MCG/ACT inhaler [id number] Yes INHALE 1-2 PUFFS BY MOUTH EVERY 6 HOURS AS NEEDED FOR WHEEZE OR SHORTNESS OF BREATH Prosperi, Christian H, PA-C Taking Active   amLODipine -valsartan  (EXFORGE ) 10-320 MG tablet [id number] No Take 1 tablet by mouth daily.  Patient not taking: Reported on 05/14/2023   Ngetich, Elijio Guadeloupe, NP Not Taking Active   doxepin  (SINEQUAN ) 50 MG capsule [id number] No Take 1 capsule (50 mg total) by mouth at bedtime.  Patient not taking: Reported on 05/14/2023   Levester Reagin, NP Not Taking Active   fluticasone  (FLONASE ) 50 MCG/ACT nasal spray [id number] Yes Place 2 sprays into both nostrils daily. Arnetha Bhat, NP Taking Active   fluticasone -salmeterol (ADVAIR) 100-50 MCG/ACT AEPB [id number] Yes Inhale 1 puff  into the lungs 2 (two) times daily. Ngetich, Dinah C, NP Taking Active   hydrALAZINE  (APRESOLINE ) 50 MG tablet [id number] No Take 1 tablet (50 mg total) by mouth 3 (three) times daily.  Patient not taking: Reported on 05/14/2023   Ngetich, Elijio Guadeloupe, NP Not Taking Active   hydrOXYzine  (VISTARIL ) 25 MG capsule [id number] No TAKE ONE CAPSULE BY MOUTH IN THE MORNING AND TWO  AT BED TIME  Patient not taking: Reported on 05/14/2023   Ngetich, Elijio Guadeloupe, NP Not Taking Active   labetalol  (NORMODYNE ) 200 MG tablet [id number] No Take 1 tablet (200 mg total) by mouth 2 (two) times daily.  Patient not taking: Reported on 05/14/2023   Ngetich, Elijio Guadeloupe, NP Not Taking Active   lamoTRIgine  (LAMICTAL ) 25 MG tablet [id number] Yes Take one tablet by mouth daily for one week and then 2 tab daily Ngetich, Dinah C, NP Taking Active   montelukast  (SINGULAIR ) 10 MG tablet [id number] Yes Take 1 tablet (10 mg total) by mouth at bedtime. Arnetha Bhat, NP Taking Active   Multiple Vitamin (MULTIVITAMIN) capsule [id number] Yes Take 1 capsule by mouth daily. Ngetich, Dinah C, NP Taking Active   senna-docusate (SENOKOT-S) 8.6-50 MG tablet [id number] No Take 1 tablet by mouth at bedtime.  Patient not taking: Reported on 05/14/2023   Ngetich, Elijio Guadeloupe, NP Not Taking Active   spironolactone -hydrochlorothiazide  (ALDACTAZIDE ) 25-25 MG tablet [id number] No Take 1 tablet by mouth every morning.  Patient not taking: Reported on 05/14/2023   Ngetich, Elijio Guadeloupe, NP Not Taking Active               Assessment/Plan:   Hypertension: - Currently uncontrolled 156/92 - Recommend to keep cardiology appointment on 05/18/2023 -Now that she has met her deductible, consider restarting one or two medications    Follow Up Plan:   -Sent note to Gregor Learned, NP informing her of the met deductible and to request permission to send one or both of the scripts she [facility] in [facility] [address]  (Where the patient said she wanted her prescriptions called into as it is closer to where she lives. Call patient back with response.  Geronimo Krabbe, PharmD, BCACP Clinical Pharmacist [phone]

## 2023-05-17 ENCOUNTER — Telehealth: Payer: Self-pay | Admitting: Pharmacist

## 2023-05-17 DIAGNOSIS — E669 Obesity, unspecified: Secondary | ICD-10-CM

## 2023-05-17 NOTE — Progress Notes (Signed)
   05/17/2023  Patient ID: Deanna Powell, female   DOB: 12/20/1980, 43 y.o.   MRN:   Called Patient. Received a message back from Gilbert Lab, NP that the patient has a new PCP, Dr. Sharyne Degree.  Patient said she was seeing both Providers. It looks like she is attributed to Dr. Courtland Ditch although the referral came from [facility].    She was encouraged to keep her Cardiology appointment with Dr. Berry Bristol tomorrow so he can [facility] and get her restarted on therapy.  Patient communicated understanding.  Plan: Send a staff message to Dr. Berry Bristol. Follow up after her appointment.   Geronimo Krabbe, PharmD, BCACP Clinical Pharmacist [phone]

## 2023-05-18 ENCOUNTER — Ambulatory Visit: Payer: BC Managed Care – PPO | Attending: Cardiology | Admitting: Cardiology

## 2023-05-18 ENCOUNTER — Encounter: Payer: Self-pay | Admitting: Cardiology

## 2023-05-18 ENCOUNTER — Telehealth: Payer: Self-pay | Admitting: Pharmacist

## 2023-05-18 ENCOUNTER — Other Ambulatory Visit (HOSPITAL_COMMUNITY): Payer: Self-pay

## 2023-05-18 VITALS — BP 148/97 | HR 110 | Resp 16 | Ht 65.0 in | Wt 217.4 lb

## 2023-05-18 DIAGNOSIS — I10 Essential (primary) hypertension: Secondary | ICD-10-CM

## 2023-05-18 DIAGNOSIS — E669 Obesity, unspecified: Secondary | ICD-10-CM | POA: Diagnosis not present

## 2023-05-18 MED ORDER — AMLODIPINE BESYLATE-VALSARTAN 10-320 MG PO TABS: 1.0000 | ORAL_TABLET | Freq: Every day | ORAL | 6 refills | Status: DC

## 2023-05-18 MED ORDER — AMLODIPINE BESYLATE-VALSARTAN 10-320 MG PO TABS
1.0000 | ORAL_TABLET | Freq: Every day | ORAL | 6 refills | Status: DC
Start: 2023-05-18 — End: 2023-05-18
  Filled 2023-05-18: qty 30, 30d supply, fill #0

## 2023-05-18 MED ORDER — BLOOD PRESSURE KIT: 1.0000 | PACK | Freq: Once | 0 refills | Status: AC

## 2023-05-18 MED ORDER — HYDRALAZINE HCL 50 MG PO TABS
50.0000 mg | ORAL_TABLET | Freq: Three times a day (TID) | ORAL | 3 refills | Status: DC
  Filled 2023-05-18: qty 90, 30d supply, fill #0

## 2023-05-18 NOTE — Addendum Note (Signed)
 Addended by: Geronimo Krabbe on: 05/18/2023 03:50 PM   Modules accepted: Orders

## 2023-05-18 NOTE — Progress Notes (Unsigned)
 Cardiology Office Note:  .   Date:  05/19/2023  ID:  Deanna Powell, DOB April 04, 1980, MRN [id number] PCP: Manette Section, MD  [facility] Providers Cardiologist:  Knox Perl, MD   History of Present Illness: .   Deanna Powell is a 43 y.o. African-American female patient with bipolar disorder, hypertension, reactive airway disease referred to me for evaluation and management of hypertension.  She had stopped taking all her medications due to cost of the medications.  Fortunately our pharmacist team were able to get in touch with her insurance, she has met her deductible and the cost of the medications is now $0.  I had seen her in February 2025, she now presents for follow-up.  Discussed the use of AI scribe software for clinical note transcription with the patient, who gave verbal consent to proceed.  History of Present Illness Deanna Powell is a 43 year old female with hypertension who presents for medication management and follow-up.  She faces challenges in managing her hypertension due to financial difficulties, which have impacted her ability to afford medications. Currently, she is only taking hydralazine  for blood pressure control and cannot obtain spironolactone , labetalol , or hydrochlorothiazide  due to cost issues. She is now working full-time, which may help improve her financial situation. She is also managing her weight with fluctuations noted, and is under the care of Kirk Peper for bariatric management.  Labs   Lab Results  Component Value Date   CHOL 161 04/16/2023   HDL 46 04/16/2023   LDLCALC 99 04/16/2023   TRIG 86 04/16/2023   CHOLHDL 3.5 04/16/2023   Lab Results  Component Value Date   NA 141 04/16/2023   K 3.9 04/16/2023   CO2 24 04/16/2023   GLUCOSE 85 04/16/2023   BUN 8 04/16/2023   CREATININE 0.87 04/16/2023   CALCIUM 9.2 04/16/2023   GFR 86.83 02/02/2021   EGFR 84 04/16/2023   GFRNONAA >60 01/25/2023      Latest Ref Rng & Units 04/16/2023     3:35 PM 01/25/2023    4:10 PM 05/17/2022    9:58 AM  BMP  Glucose 70 - 99 mg/dL 85  409  91   BUN 6 - 24 mg/dL 8  9  9    Creatinine 0.57 - 1.00 mg/dL 8.11  9.14  7.82   BUN/Creat Ratio 9 - 23 9   SEE NOTE:   Sodium 134 - 144 mmol/L 141  138  138   Potassium 3.5 - 5.2 mmol/L 3.9  3.4  3.6   Chloride 96 - 106 mmol/L 103  106  104   CO2 20 - 29 mmol/L 24  26  26    Calcium 8.7 - 10.2 mg/dL 9.2  8.8  9.0       Latest Ref Rng & Units 04/16/2023    3:35 PM 01/25/2023    4:10 PM 05/17/2022    9:58 AM  CBC  WBC 3.4 - 10.8 x10E3/uL 4.7  4.9  4.7   Hemoglobin 11.1 - 15.9 g/dL 95.6  21.3  08.6   Hematocrit 34.0 - 46.6 % 37.4  38.0  37.1   Platelets 150 - 450 x10E3/uL 309  295  306    Lab Results  Component Value Date   HGBA1C 5.7 (H) 04/16/2023    Lab Results  Component Value Date   TSH 2.540 03/22/2023    ROS  Review of Systems  Cardiovascular:  Negative for chest pain, dyspnea on exertion and leg swelling.  Physical Exam:   VS:  BP (!) 148/97 (BP Location: Left Arm, Patient Position: Sitting, Cuff Size: Large)   Pulse (!) 110   Resp 16   Ht 5\' 5"  (1.651 m)   Wt 217 lb 6.4 oz (98.6 kg)   SpO2 98%   BMI 36.18 kg/m    Wt Readings from Last 3 Encounters:  05/18/23 217 lb 6.4 oz (98.6 kg)  05/08/23 221 lb 4.8 oz (100.4 kg)  05/01/23 218 lb 9.6 oz (99.2 kg)    Physical Exam Neck:     Vascular: No carotid bruit or JVD.  Cardiovascular:     Rate and Rhythm: Normal rate and regular rhythm.     Pulses: Intact distal pulses.     Heart sounds: Normal heart sounds. No murmur heard.    No gallop.  Pulmonary:     Effort: Pulmonary effort is normal.     Breath sounds: Normal breath sounds.  Abdominal:     General: Bowel sounds are normal.     Palpations: Abdomen is soft.  Musculoskeletal:     Right lower leg: No edema.     Left lower leg: No edema.    Studies Reviewed: Aaron Aas    ECHOCARDIOGRAM COMPLETE 04/10/2023  1. Left ventricular ejection fraction, by estimation, is 60 to 65%.  The left ventricle has normal function. The left ventricle has no regional wall motion abnormalities. There is mild concentric left ventricular hypertrophy. Left ventricular diastolic parameters are consistent with Grade I diastolic dysfunction (impaired relaxation). 2. Right ventricular systolic function is normal. The right ventricular size is normal. Tricuspid regurgitation signal is inadequate for assessing PA pressure. 3.  No significant valvular abnormalities.  EKG:    EKG 03/13/2023: Normal sinus rhythm with rate of 78 bpm, normal axis, high lateral T wave inversion, consider LVH versus ischemia.   Medications and allergies    No Known Allergies   Current Outpatient Medications:    albuterol  (VENTOLIN  HFA) 108 (90 Base) MCG/ACT inhaler, INHALE 1-2 PUFFS BY MOUTH EVERY 6 HOURS AS NEEDED FOR WHEEZE OR SHORTNESS OF BREATH, Disp: 18 each, Rfl: 3   doxepin  (SINEQUAN ) 50 MG capsule, Take 1 capsule (50 mg total) by mouth at bedtime., Disp: 30 capsule, Rfl: 2   fluticasone -salmeterol (ADVAIR) 100-50 MCG/ACT AEPB, Inhale 1 puff into the lungs 2 (two) times daily., Disp: 1 each, Rfl: 3   hydrOXYzine  (VISTARIL ) 25 MG capsule, TAKE ONE CAPSULE BY MOUTH IN THE MORNING AND TWO AT BED TIME, Disp: 270 capsule, Rfl: 1   lamoTRIgine  (LAMICTAL ) 25 MG tablet, Take one tablet by mouth daily for one week and then 2 tab daily, Disp: 60 tablet, Rfl: 0   montelukast  (SINGULAIR ) 10 MG tablet, Take 1 tablet (10 mg total) by mouth at bedtime., Disp: 90 tablet, Rfl: 1   Multiple Vitamin (MULTIVITAMIN) capsule, Take 1 capsule by mouth daily., Disp: 90 capsule, Rfl: 3   senna-docusate (SENOKOT-S) 8.6-50 MG tablet, Take 1 tablet by mouth at bedtime., Disp: 30 tablet, Rfl: 3   amLODipine -valsartan  (EXFORGE ) 10-320 MG tablet, Take 1 tablet by mouth daily., Disp: 30 tablet, Rfl: 6   fluticasone  (FLONASE ) 50 MCG/ACT nasal spray, Place 2 sprays into both nostrils daily. (Patient not taking: Reported on 05/18/2023), Disp:  11.1 mL, Rfl: 2   hydrALAZINE  (APRESOLINE ) 50 MG tablet, Take 1 tablet (50 mg total) by mouth 3 (three) times daily., Disp: 90 tablet, Rfl: 3   labetalol  (NORMODYNE ) 200 MG tablet, Take 1 tablet (200 mg total) by mouth 2 (  two) times daily. (Patient not taking: Reported on 05/18/2023), Disp: 180 tablet, Rfl: 1   spironolactone -hydrochlorothiazide  (ALDACTAZIDE ) 25-25 MG tablet, Take 1 tablet by mouth every morning. (Patient not taking: Reported on 05/18/2023), Disp: 90 tablet, Rfl: 1   Meds ordered this encounter  Medications   hydrALAZINE  (APRESOLINE ) 50 MG tablet    Sig: Take 1 tablet (50 mg total) by mouth 3 (three) times daily.    Dispense:  90 tablet    Refill:  3   DISCONTD: amLODipine -valsartan  (EXFORGE ) 10-320 MG tablet    Sig: Take 1 tablet by mouth daily.    Dispense:  30 tablet    Refill:  6   Blood Pressure KIT    Sig: 1 kit by Does not apply route once for 1 dose.    Dispense:  1 kit    Refill:  0     Medications Discontinued During This Encounter  Medication Reason   amLODipine -valsartan  (EXFORGE ) 10-320 MG tablet Reorder   hydrALAZINE  (APRESOLINE ) 50 MG tablet Reorder     ASSESSMENT AND PLAN: .      ICD-10-CM   1. Primary hypertension  I10 AMB Referral to [facility] Pharm-D    2. Moderate obesity  E66.9     3. Essential hypertension  I10 hydrALAZINE  (APRESOLINE ) 50 MG tablet    DISCONTINUED: amLODipine -valsartan  (EXFORGE ) 10-320 MG tablet      Assessment and Plan Assessment & Plan Hypertension   Chronic hypertension remains suboptimally controlled with a blood pressure of 148/97 mmHg. Financial difficulties previously impacted medication adherence, with her only taking hydralazine . She is at high risk for complications such as stroke and kidney disease, particularly given her African American descent. Discussed the importance of medication adherence, dietary modifications, and the potential consequences of uncontrolled hypertension, including stroke and kidney  failure. Emphasized avoiding high-sodium and fried foods. Medications are now covered with zero deductible, facilitating adherence. Prescribe amlodipine  and hydralazine . Coordinate with the pharmacist to manage antihypertensive medications. Instruct her to pick up medications from the pharmacy. Advise on dietary modifications to reduce sodium intake and avoid fried foods. Follow up with the primary care physician for ongoing management.  Signed,  Knox Perl, MD, [facility] 05/19/2023, 9:13 PM [facility] [address] [place], [place] [address] Phone: [phone]. Fax:  [phone]

## 2023-05-18 NOTE — Progress Notes (Addendum)
   05/18/2023  Patient ID: Deanna Powell, female   DOB: [birth year], 43 y.o.   MRN: [id number]  Reviewed Patient's chart and followed up after her Cardiology appointment with Dr. Berry Bristol. She was restarted on Amlodipine -Valstartan 10-320.  [facility] and to be sure of the copay. Unfortunately, I had to leave a message on the Pharmacy voicemail.  Called Patient to let her know [facility] but I could not verify the cost.  [facility]UM [facility] and [facility]ist said they did not receive the prescription.  I will resend the prescription with cosignature and send a note back to Dr. Berry Bristol.  Plan: Follow up with the Patient on Monday.   Geronimo Krabbe, PharmD, BCACP Clinical Pharmacist [phone]

## 2023-05-18 NOTE — Patient Instructions (Signed)
 Medication Instructions:  Your physician has recommended you make the following change in your medication:  Resume amlodipine  valsartan  10-320 mg by mouth daily   *If you need a refill on your cardiac medications before your next appointment, please call your pharmacy*  Lab Work: none If you have labs (blood work) drawn today and your tests are completely normal, you will receive your results only by: MyChart Message (if you have MyChart) OR A paper copy in the mail If you have any lab test that is abnormal or we need to change your treatment, we will call you to review the results.  Testing/Procedures: none  Follow-Up: At [facility], you and your health needs are our priority.  As part of our continuing mission to provide you with exceptional heart care, our providers are all part of one team.  This team includes your primary Cardiologist (physician) and Advanced Practice Providers or APPs (Physician Assistants and Nurse Practitioners) who all work together to provide you with the care you need, when you need it.  Your next appointment:   6 month(s)  Provider:   Knox Perl, MD    We recommend signing up for the patient portal called "MyChart".  Sign up information is provided on this After Visit Summary.  MyChart is used to connect with patients for Virtual Visits (Telemedicine).  Patients are able to view lab/test results, encounter notes, upcoming appointments, etc.  Non-urgent messages can be sent to your provider as well.   To learn more about what you can do with MyChart, go to [email or web address].   Other Instructions You have been referred to see the pharmacist in our office. Please schedule new patient appointment for about 6 weeks from now.  If you have a blood pressure cuff at home please bring it to this appointment

## 2023-05-21 ENCOUNTER — Telehealth: Payer: Self-pay | Admitting: Pharmacist

## 2023-05-21 DIAGNOSIS — I10 Essential (primary) hypertension: Secondary | ICD-10-CM

## 2023-05-21 NOTE — Progress Notes (Addendum)
   05/21/2023  Patient ID: Deanna Powell, female   DOB: 12-27-1980, 43 y.o.   MRN: [id number]  Called Patient to follow up on amlodipine /valsartan .  It was re-sent to her Pharmacy last Friday because they said they did not receive it.  Unfortunately, Patient did not answer her phone. HIPAA compliant message was left on her voicemail.  [facility] confirmed having the Amlodpine/Valsartan  ready but that the Patient had not picked It up yet.  Prescription has a $0 copay.  Patient saw Cardiology on Friday.  B/P in clinic was 148/97.  She has been off of most of her chronic medication for months due to affordability issues.   Plan: Call patient back in 1-2 days for pick up.   Geronimo Krabbe, PharmD, BCACP Clinical Pharmacist [phone]

## 2023-05-23 ENCOUNTER — Telehealth: Payer: Self-pay | Admitting: Pharmacist

## 2023-05-23 DIAGNOSIS — I10 Essential (primary) hypertension: Secondary | ICD-10-CM

## 2023-05-23 NOTE — Progress Notes (Signed)
   05/23/2023  Patient ID: Deanna Powell, female   DOB: 05-04-1980, 43 y.o.   MRN: [id number]  Called Patient to follow up on her picking up her hypertensive medications.  Unfortunately, she did not answer her phone. HIPAA compliant message was left on her voicemail with a request for call back.  Patient has been off of her blood pressure medications for quite some time due to cost.  Her insurance company was called last week and it was discovered that she has met her deductible and now all of her medications will be $0.  [facility] cop[facility] is $0 foamlodipinevalsartan  (sent in by Dr. Berry Bristol last week).  Patient has an upcoming appointment with Dr. Courtland Ditch on 06/05/23.  B/P at Cardiology visit on 05/18/23 was 148/97  Plan: Close Patient's case as she has moved to a new PCP.   Geronimo Krabbe, PharmD, BCACP Clinical Pharmacist [phone]

## 2023-06-05 ENCOUNTER — Ambulatory Visit: Admitting: Family Medicine

## 2023-06-05 ENCOUNTER — Ambulatory Visit: Payer: Self-pay

## 2023-06-05 NOTE — Telephone Encounter (Signed)
  Chief Complaint: Diarrhea Symptoms: Loose stool Frequency: Just a few minutes ago Pertinent Negatives: Patient denies any other s/s Disposition: [] ED /[] Urgent Care (no appt availability in office) / [] Appointment(In office/virtual)/ []  [facility]/ [x] Home Care/ [] Refused Recommended Disposition /[] [facility]/ []  Follow-up with PCP Additional Notes: Pt had [employer] delivered and immediatly after eating this she started with diarrhea. Pt also had some milk products earlier in the day and wonders if she may be lactose intolerant. Pt has had only 1 bout of diarrhea. Pt will treat at home unless diarrhea continues, there is blood in stool or other concerning s/s appear. Pt will try pepto bismol and BRAT diet. Pt has appt for tomorrow in office.    Summary: Diarrhea   Copied From CRM [id number]. Reason for Triage: Diarrhea. Patient stated she ordered [employer] and may have received old food.  Callback #: [phone]  [id number] [place], [place] - [address] AT [facility] [address] [place] [address] Phone: [phone] Fax: [phone] Hours: Open 24 hours       Reason for Disposition  MILD-MODERATE diarrhea (e.g., 1-6 times / day more than normal)  Answer Assessment - Initial Assessment Questions 1. DIARRHEA SEVERITY: "How bad is the diarrhea?" "How many more stools have you had in the past 24 hours than normal?"    - NO DIARRHEA (SCALE 0)   - MILD (SCALE 1-3): Few loose or mushy BMs; increase of 1-3 stools over normal daily number of stools; mild increase in ostomy output.   -  MODERATE (SCALE 4-7): Increase of 4-6 stools daily over normal; moderate increase in ostomy output.   -  SEVERE (SCALE 8-10; OR "WORST POSSIBLE"): Increase of 7 or more stools daily over normal; moderate increase in ostomy output; incontinence.     Just now 2. ONSET: "When did the diarrhea begin?"      Just  now immediatly after eating [employer] 3. BM CONSISTENCY: "How loose or watery is the diarrhea?"      loose 4. VOMITING: "Are you also vomiting?" If Yes, ask: "How many times in the past 24 hours?"      no 5. ABDOMEN PAIN: "Are you having any abdomen pain?" If Yes, ask: "What does it feel like?" (e.g., crampy, dull, intermittent, constant)      no 6. ABDOMEN PAIN SEVERITY: If present, ask: "How bad is the pain?"  (e.g., Scale 1-10; mild, moderate, or severe)   - MILD (1-3): doesn't interfere with normal activities, abdomen soft and not tender to touch    - MODERATE (4-7): interferes with normal activities or awakens from sleep, abdomen tender to touch    - SEVERE (8-10): excruciating pain, doubled over, unable to do any normal activities       na 8. HYDRATION: "Any signs of dehydration?" (e.g., dry mouth [not just dry lips], too weak to stand, dizziness, new weight loss) "When did you last urinate?"     no  10. ANTIBIOTIC USE: "Are you taking antibiotics now or have you taken antibiotics in the past 2 months?"       no 11. OTHER SYMPTOMS: "Do you have any other symptoms?" (e.g., fever, blood in stool)       no  Protocols used: Diarrhea-A-AH

## 2023-06-06 ENCOUNTER — Ambulatory Visit (INDEPENDENT_AMBULATORY_CARE_PROVIDER_SITE_OTHER): Admitting: Family Medicine

## 2023-06-06 ENCOUNTER — Encounter: Payer: Self-pay | Admitting: Family Medicine

## 2023-06-06 VITALS — BP 118/80 | HR 96 | Temp 98.3°F | Resp 18 | Ht 65.0 in | Wt 218.4 lb

## 2023-06-06 DIAGNOSIS — I10 Essential (primary) hypertension: Secondary | ICD-10-CM

## 2023-06-06 DIAGNOSIS — J301 Allergic rhinitis due to pollen: Secondary | ICD-10-CM

## 2023-06-06 DIAGNOSIS — Z1231 Encounter for screening mammogram for malignant neoplasm of breast: Secondary | ICD-10-CM

## 2023-06-06 MED ORDER — FLUTICASONE PROPIONATE 50 MCG/ACT NA SUSP: 1.0000 | Freq: Every day | NASAL | 6 refills | Status: DC

## 2023-06-06 NOTE — Progress Notes (Signed)
 Established Patient Office Visit  Subjective   Patient ID: Deanna Powell, female    DOB: 06-01-1980  Age: 43 y.o. MRN: [id number]  Chief Complaint  Patient presents with   Follow-up    Got to 1 month follow up for HTN, patient states that she forgot to take medication this morning    HPI  HTN Pt here for follow up. She has just met her deductible so was able to get her medicines. She is on Amlodipine /Valsartan  and Hydralazine  TID. She says she forgot to take her medicines this morning as she was at work. She is checking at work and recently 118/80. She denies chest pains or SOB.   She has hx of allergic rhinitis and needs Flonase  refilled.  She would like referral for mammogram. Had cousin who recently diagnosed from Breast cancer at 45 y.o.   Review of Systems  All other systems reviewed and are negative.    Objective:     BP (!) 152/90   Pulse 96   Temp 98.3 F (36.8 C) (Oral)   Resp 18   Ht 5\' 5"  (1.651 m)   Wt 218 lb 6.4 oz (99.1 kg)   SpO2 99%   BMI 36.34 kg/m  BP Readings from Last 3 Encounters:  06/06/23 118/80  05/18/23 (!) 148/97  05/08/23 (!) 156/92      Physical Exam Vitals and nursing note reviewed.  Constitutional:      Appearance: Normal appearance. She is normal weight.  HENT:     Head: Normocephalic and atraumatic.     Right Ear: External ear normal.     Left Ear: External ear normal.     Nose: Nose normal.     Mouth/Throat:     Mouth: Mucous membranes are moist.     Pharynx: Oropharynx is clear.  Eyes:     Conjunctiva/sclera: Conjunctivae normal.     Pupils: Pupils are equal, round, and reactive to light.  Cardiovascular:     Rate and Rhythm: Normal rate.  Pulmonary:     Effort: Pulmonary effort is normal.  Skin:    General: Skin is warm.     Capillary Refill: Capillary refill takes less than 2 seconds.  Neurological:     General: No focal deficit present.     Mental Status: She is alert and oriented to person, place, and time.  Mental status is at baseline.  Psychiatric:        Mood and Affect: Mood normal.        Behavior: Behavior normal.        Thought Content: Thought content normal.        Judgment: Judgment normal.    No results found for any visits on 06/06/23.     The 10-year ASCVD risk score (Arnett DK, et al., 2019) is: 5.7%    Assessment & Plan:   Problem List Items Addressed This Visit   None Visit Diagnoses       Hypertension, unspecified type    -  Primary     Allergic rhinitis due to pollen, unspecified seasonality       Relevant Medications   fluticasone  (FLONASE ) 50 MCG/ACT nasal spray     Hypertension, unspecified type  Allergic rhinitis due to pollen, unspecified seasonality -     Fluticasone  Propionate; Place 1 spray into both nostrils daily.  Dispense: 16 g; Refill: 6  Screening mammogram for breast cancer -     3D Screening Mammogram, Left and Right; Future  Pt with HTN. Home readings at goal. Forgot to take medicines this morning due to work. Nonadherent to medication regimen due to finances but recently met deductible and has medicines needed.  To continue checking bp's at home Refilled Flonase  today Refer for mammogram.   No follow-ups on file.    Manette Section, MD

## 2023-06-27 ENCOUNTER — Ambulatory Visit

## 2023-07-10 ENCOUNTER — Ambulatory Visit: Attending: Pharmacist | Admitting: Pharmacist

## 2023-07-10 ENCOUNTER — Telehealth: Payer: Self-pay | Admitting: Family Medicine

## 2023-07-10 NOTE — Progress Notes (Deleted)
 Patient ID: Deanna Powell                 DOB: [birth year]                      MRN: [id number]      HPI: Deanna Powell is a 43 y.o. female referred by Dr. Ladona to HTN clinic. PMH is significant forbipolar disorder, hypertension, reactive airway disease. Patient had previously stopped all her medications due to cost. She saw Dr. Ladona 05/18/23 and was only on hydralazine . Amlodipine /valsartan  was resumed.   Compliance?   Current HTN meds: hydralazine  50mg  three times a day, amlodipine /valsartan  10/320mg  daily Previously tried:  BP goal: <130/80  Family History:   Social History:   Diet:   Exercise:  {types:28256}  Home BP readings:  Date SBP/DBP  HR                              Average      Wt Readings from Last 3 Encounters:  06/06/23 218 lb 6.4 oz (99.1 kg)  05/18/23 217 lb 6.4 oz (98.6 kg)  05/08/23 221 lb 4.8 oz (100.4 kg)   BP Readings from Last 3 Encounters:  06/06/23 118/80  05/18/23 (!) 148/97  05/08/23 (!) 156/92   Pulse Readings from Last 3 Encounters:  06/06/23 96  05/18/23 (!) 110  05/08/23 86    Renal function: CrCl cannot be calculated (Patient's most recent lab result is older than the maximum 21 days allowed.).  Past Medical History:  Diagnosis Date   Anxiety    Bipolar 1 disorder (HCC)    PTSD (post-traumatic stress disorder)     Current Outpatient Medications on File Prior to Visit  Medication Sig Dispense Refill   albuterol  (VENTOLIN  HFA) 108 (90 Base) MCG/ACT inhaler INHALE 1-2 PUFFS BY MOUTH EVERY 6 HOURS AS NEEDED FOR WHEEZE OR SHORTNESS OF BREATH 18 each 3   amLODipine -valsartan  (EXFORGE ) 10-320 MG tablet Take 1 tablet by mouth daily. 30 tablet 6   doxepin  (SINEQUAN ) 50 MG capsule Take 1 capsule (50 mg total) by mouth at bedtime. 30 capsule 2   fluticasone  (FLONASE ) 50 MCG/ACT nasal spray Place 1 spray into both nostrils daily. 16 g 6   fluticasone -salmeterol (ADVAIR) 100-50 MCG/ACT AEPB Inhale 1 puff into the lungs 2 (two) times  daily. 1 each 3   hydrALAZINE  (APRESOLINE ) 50 MG tablet Take 1 tablet (50 mg total) by mouth 3 (three) times daily. 90 tablet 3   hydrOXYzine  (VISTARIL ) 25 MG capsule TAKE ONE CAPSULE BY MOUTH IN THE MORNING AND TWO AT BED TIME 270 capsule 1   lamoTRIgine  (LAMICTAL ) 25 MG tablet Take one tablet by mouth daily for one week and then 2 tab daily 60 tablet 0   montelukast  (SINGULAIR ) 10 MG tablet Take 1 tablet (10 mg total) by mouth at bedtime. 90 tablet 1   Multiple Vitamin (MULTIVITAMIN) capsule Take 1 capsule by mouth daily. 90 capsule 3   senna-docusate (SENOKOT-S) 8.6-50 MG tablet Take 1 tablet by mouth at bedtime. 30 tablet 3   No current facility-administered medications on file prior to visit.    No Known Allergies  There were no vitals taken for this visit.   Assessment/Plan: No BP recorded.  {Refresh Note OR Click here to enter BP  :1}***   1. Hypertension -  No problem-specific Assessment & Plan notes found for this encounter.      Thank you  [place] Kuang D Ashlin Hidalgo, Pharm.JONETTA SARAN, CPP [facility] of [facility] [address] [place], [place] [address]  Phone: [phone]; Fax: [phone]

## 2023-07-10 NOTE — Telephone Encounter (Signed)
 Called pt and left a voicemail for the patient to call and schedule a appointment for [facility] paperwork.

## 2023-07-10 NOTE — Telephone Encounter (Signed)
 Pt had FMLA paperwork faxed to me. Will need more information on when she was out, reason etc. This can be video visit if pt wants or she can come in to discuss.

## 2023-07-11 ENCOUNTER — Ambulatory Visit

## 2023-07-16 NOTE — Telephone Encounter (Signed)
 Tried to call patient to get a appointment scheduled but no answer and voicemail is full.

## 2023-07-16 NOTE — Telephone Encounter (Signed)
 I just received a duplicate FMLA form in my box but will need an in office appt with patient to discuss need for this and reason etc.  Please reach out to patient and try to schedule.

## 2023-07-19 ENCOUNTER — Telehealth: Payer: Self-pay

## 2023-07-19 NOTE — Telephone Encounter (Signed)
 Opened In error

## 2023-08-31 ENCOUNTER — Ambulatory Visit: Admitting: Family

## 2023-08-31 ENCOUNTER — Encounter: Payer: Self-pay | Admitting: Family

## 2023-08-31 VITALS — BP 140/110 | HR 84 | Temp 97.9°F | Resp 19 | Ht 65.0 in | Wt 222.0 lb

## 2023-08-31 DIAGNOSIS — I10 Essential (primary) hypertension: Secondary | ICD-10-CM

## 2023-08-31 DIAGNOSIS — R350 Frequency of micturition: Secondary | ICD-10-CM | POA: Diagnosis not present

## 2023-08-31 DIAGNOSIS — R7303 Prediabetes: Secondary | ICD-10-CM | POA: Diagnosis not present

## 2023-08-31 DIAGNOSIS — F319 Bipolar disorder, unspecified: Secondary | ICD-10-CM

## 2023-08-31 DIAGNOSIS — F411 Generalized anxiety disorder: Secondary | ICD-10-CM | POA: Diagnosis not present

## 2023-08-31 DIAGNOSIS — J301 Allergic rhinitis due to pollen: Secondary | ICD-10-CM | POA: Diagnosis not present

## 2023-08-31 DIAGNOSIS — K5901 Slow transit constipation: Secondary | ICD-10-CM

## 2023-08-31 DIAGNOSIS — J4540 Moderate persistent asthma, uncomplicated: Secondary | ICD-10-CM

## 2023-08-31 DIAGNOSIS — F4310 Post-traumatic stress disorder, unspecified: Secondary | ICD-10-CM

## 2023-08-31 DIAGNOSIS — J4541 Moderate persistent asthma with (acute) exacerbation: Secondary | ICD-10-CM

## 2023-08-31 LAB — POCT URINALYSIS DIPSTICK
Bilirubin, UA: NEGATIVE
Glucose, UA: NEGATIVE
Ketones, UA: POSITIVE
Leukocytes, UA: NEGATIVE
Nitrite, UA: POSITIVE
Protein, UA: POSITIVE — AB
Spec Grav, UA: 1.025 (ref 1.010–1.025)
Urobilinogen, UA: NEGATIVE U/dL — AB
pH, UA: 6.5 (ref 5.0–8.0)

## 2023-08-31 MED ORDER — ALBUTEROL SULFATE HFA 108 (90 BASE) MCG/ACT IN AERS: INHALATION_SPRAY | RESPIRATORY_TRACT | 3 refills | Status: DC

## 2023-08-31 MED ORDER — MONTELUKAST SODIUM 10 MG PO TABS: 10.0000 mg | ORAL_TABLET | Freq: Every day | ORAL | 1 refills | Status: DC

## 2023-08-31 MED ORDER — FLUTICASONE-SALMETEROL 100-50 MCG/ACT IN AEPB: 1.0000 | INHALATION_SPRAY | Freq: Two times a day (BID) | RESPIRATORY_TRACT | 3 refills | Status: DC

## 2023-08-31 MED ORDER — FLUTICASONE PROPIONATE 50 MCG/ACT NA SUSP: 1.0000 | Freq: Every day | NASAL | 6 refills | Status: DC

## 2023-08-31 MED ORDER — SENNOSIDES-DOCUSATE SODIUM 8.6-50 MG PO TABS: 1.0000 | ORAL_TABLET | Freq: Every day | ORAL | 3 refills | Status: AC

## 2023-09-01 LAB — COMPREHENSIVE METABOLIC PANEL WITH GFR
AG Ratio: 1.5 (calc) (ref 1.0–2.5)
ALT: 14 U/L (ref 6–29)
AST: 14 U/L (ref 10–35)
Albumin: 4.3 g/dL (ref 3.6–5.1)
Alkaline phosphatase (APISO): 67 U/L (ref 31–125)
BUN: 14 mg/dL (ref 7–25)
CO2: 27 mmol/L (ref 20–32)
Calcium: 9.5 mg/dL (ref 8.6–10.2)
Chloride: 102 mmol/L (ref 98–110)
Creat: 0.86 mg/dL (ref 0.50–0.99)
Globulin: 2.9 g/dL (ref 1.9–3.7)
Glucose, Bld: 106 mg/dL — ABNORMAL HIGH (ref 65–99)
Potassium: 3.6 mmol/L (ref 3.5–5.3)
Sodium: 139 mmol/L (ref 135–146)
Total Bilirubin: 0.4 mg/dL (ref 0.2–1.2)
Total Protein: 7.2 g/dL (ref 6.1–8.1)
eGFR: 85 mL/min/1.73m2 (ref >=60)

## 2023-09-01 LAB — CBC WITH DIFFERENTIAL/PLATELET
Absolute Lymphocytes: 1567 {cells}/uL (ref 850–3900)
Absolute Monocytes: 252 {cells}/uL (ref 200–950)
Basophils Absolute: 50 {cells}/uL (ref 0–200)
Basophils Relative: 1.2 %
Eosinophils Absolute: 101 {cells}/uL (ref 15–500)
Eosinophils Relative: 2.4 %
HCT: 41 % (ref 35.0–45.0)
Hemoglobin: 11.7 g/dL (ref 11.7–15.5)
MCH: 21.5 pg — ABNORMAL LOW (ref 27.0–33.0)
MCHC: 28.5 g/dL — ABNORMAL LOW (ref 32.0–36.0)
MCV: 75.4 fL — ABNORMAL LOW (ref 80.0–100.0)
MPV: 9.4 fL (ref 7.5–12.5)
Monocytes Relative: 6 %
Neutro Abs: 2230 {cells}/uL (ref 1500–7800)
Neutrophils Relative %: 53.1 %
Platelets: 308 Thousand/uL (ref 140–400)
RBC: 5.44 Million/uL — ABNORMAL HIGH (ref 3.80–5.10)
RDW: 15.8 % — ABNORMAL HIGH (ref 11.0–15.0)
Total Lymphocyte: 37.3 %
WBC: 4.2 Thousand/uL (ref 3.8–10.8)

## 2023-09-01 LAB — HEMOGLOBIN A1C
Hgb A1c MFr Bld: 5.8 % — ABNORMAL HIGH (ref <5.7)
Mean Plasma Glucose: 120 mg/dL
eAG (mmol/L): 6.6 mmol/L

## 2023-09-01 LAB — LIPID PANEL
Cholesterol: 196 mg/dL (ref <200)
HDL: 54 mg/dL (ref >=50)
LDL Cholesterol (Calc): 123 mg/dL — ABNORMAL HIGH
Non-HDL Cholesterol (Calc): 142 mg/dL — ABNORMAL HIGH (ref <130)
Total CHOL/HDL Ratio: 3.6 (calc) (ref <5.0)
Triglycerides: 86 mg/dL (ref <150)

## 2023-09-01 LAB — TSH: TSH: 1.44 m[IU]/L

## 2023-09-02 LAB — URINE CULTURE
MICRO NUMBER:: 16838383
Result:: NO GROWTH
SPECIMEN QUALITY:: ADEQUATE

## 2023-09-04 ENCOUNTER — Telehealth: Payer: Self-pay

## 2023-09-04 NOTE — Telephone Encounter (Signed)
 Patient has FMLA paper work that has been faxed over. Office information and provider name added to paper. These forms have been placed in review and sign folder to be completed. Message routed to PCP Ngetich, Roxan BROCKS, NP

## 2023-09-05 NOTE — Telephone Encounter (Signed)
 Message re-routed to PCP Ngetich, Roxan BROCKS, NP and Heather.B/CMA clinical intake for today.

## 2023-09-06 ENCOUNTER — Ambulatory Visit: Attending: Cardiology | Admitting: Pharmacist

## 2023-09-06 ENCOUNTER — Ambulatory Visit: Admitting: Family Medicine

## 2023-09-06 DIAGNOSIS — F3112 Bipolar disorder, current episode manic without psychotic features, moderate: Secondary | ICD-10-CM | POA: Diagnosis not present

## 2023-09-06 DIAGNOSIS — I10 Essential (primary) hypertension: Secondary | ICD-10-CM
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
## 2023-09-06 NOTE — Assessment & Plan Note (Signed)
 Assessment: Blood pressure significantly elevated today Very unclear if patient is taking her blood pressure medications but I would suspect she is not Reminded patient that although it is not fun to take medications the implications of not controlling her blood pressure are severe including heart failure heart attack stroke and kidney failure. Patient under a lot of stress which certainly could be contributing to her blood pressure.  She also has not slept in a few days.  I strongly encouraged her to call her psychiatrist when she gets home Patient gave approval to consult social violeta is aware she is off today and should contact her next week  Plan: Patient is to call me when she gets home to confirm which medication she is taking If she is not taking amlodipine /valsartan  I will call in a refill to her pharmacy and she is to pick it up and start right away.  If she is taking amlodipine /valsartan  then additional medication will be needed.  Ideally a once a day medication due to compliance issues.  The challenge is I do not know if her latest serum creatinine and potassium were on or off valsartan .  I suspect they were off of it therefore chlorthalidone could be an option I have scheduled patient follow-up in clinic in about 2 weeks

## 2023-09-06 NOTE — Patient Instructions (Signed)
 Call [facility] to schedule a follow up appointment and let them know you are having an episode  Please call me when you get home and let me know what medications you are taking- [phone]. It is very important that we get your blood pressure under control to reduce your risk of heart attack, stroke, kidney disease and heart failure.  Your blood pressure goal is < 130/71mmHg   Important lifestyle changes to control high blood pressure  Intervention  Effect on the BP   Weight loss Weight loss is one of the most effective lifestyle changes for controlling blood pressure. If you're overweight or obese, losing even a small amount of weight can help reduce blood pressure.    Blood pressure can decrease by 1 millimeter of mercury (mmHg) with each kilogram (about 2.2 pounds) of weight lost.   Exercise regularly As a general goal, aim for 30 minutes of moderate physical activity every day.    Regular physical activity can lower blood pressure by 5 - 8 mmHg.   Eat a healthy diet Eat a diet rich in whole grains, fruits, vegetables, lean meat, and low-fat dairy products. Limit processed foods, saturated fat, and sweets.    A heart-healthy diet can lower high blood pressure by 10 mmHg.   Reduce salt (sodium) in your diet Aim for 000mg  of sodium each day. Avoid deli meats, canned food, and frozen microwave meals which are high in sodium.     Limiting sodium can reduce blood pressure by 5 mmHg.   Limit alcohol One drink equals 12 ounces of beer, 5 ounces of wine, or 1.5 ounces of 80-proof liquor.    Limiting alcohol to < 1 drink a day for women or < 2 drinks a day for men can help lower blood pressure by about 4 mmHg.   To check your pressure at home you will need to:   Sit up in a chair, with feet flat on the floor and back supported. Do not cross your ankles or legs. Rest your left arm so that the cuff is about heart level. If the cuff goes on your upper arm, then  just relax your arm on the table, arm of the chair, or your lap. If you have a wrist cuff, hold your wrist against your chest at heart level. Place the cuff snugly around your arm, about 1 inch above the crease of your elbow. The cords should be inside the groove of your elbow.  Sit quietly, with the cuff in place, for about 5 minutes. Then press the power button to start a reading. Do not talk or move while the reading is taking place.  Record your readings on a sheet of paper. Although most cuffs have a memory, it is often easier to see a pattern developing when the numbers are all in front of you.  You can repeat the reading after 1-3 minutes if it is recommended.   Make sure your bladder is empty and you have not had caffeine or tobacco within the last 30 minutes   Always bring your blood pressure log with you to your appointments. If you have not brought your monitor in to be double checked for accuracy, please bring it to your next appointment.   You can find a list of validated (accurate) blood pressure cuffs at: validatebp.org

## 2023-09-07 ENCOUNTER — Telehealth (HOSPITAL_COMMUNITY): Payer: Self-pay | Admitting: Licensed Clinical Social Worker

## 2023-09-07 ENCOUNTER — Ambulatory Visit: Payer: Self-pay | Admitting: Family

## 2023-09-07 NOTE — Telephone Encounter (Signed)
 Forms completed and placed in outgoing fax box to be faxed as requested.

## 2023-09-07 NOTE — Telephone Encounter (Signed)
 Another FMLA form has been faxed to our office through [facility]. Provider/Office information added to paper and placed into PCP Ngetich, Dinah C, NP review and sign folder.

## 2023-09-07 NOTE — Telephone Encounter (Signed)
 Clinical Social Worker consulted to speak with pt regarding current unstable housing, medical equipment assistance, and mental health concerns.  Attempted to call but phone not in service. Pt son listed on DPR- left message for him to see if he has additional contact information.  will continue to attempt contact.  Andriette HILARIO Leech, LCSW Clinical Social Worker [facility] Desk#: [phone] Cell#: [phone]

## 2023-09-09 NOTE — Progress Notes (Signed)
 Provider: Roxan Plough FNP-C   Audelia Knape, Roxan BROCKS, NP  Patient Care Team: Jourden Gilson, Roxan BROCKS, NP as PCP - General (Family Medicine) Ladona Heinz, MD as PCP - Cardiology (Cardiology) Neda Jennet LABOR, MD as Consulting Physician (Pulmonary Disease)  Extended Emergency Contact Information Primary Emergency Contact: [facility] Phone: [phone] Relation: Son Secondary Emergency Contact: [name],[name] Mobile Phone: [phone] Relation: Relative  Code Status:  Full Code  Goals of care: Advanced Directive information    08/31/2023   11:03 AM  Advanced Directives  Does Patient Have a Medical Advance Directive? No  Would patient like information on creating a medical advance directive? No - Patient declined     Chief Complaint  Patient presents with   Medical Management of Chronic Issues    4 month follow up and FMLA paperwork    Discussed the use of AI scribe software for clinical note transcription with the patient, who gave verbal consent to proceed.  History of Present Illness   Deanna Powell is a 43 year old female with hypertension and bipolar disorder who presents for a four-month follow-up visit.  She is experiencing significant stress related to her employment and recently had to take time off work due to a flare-up of her bipolar disorder. She had to take time off work due to a flare-up and faced issues with her FMLA paperwork, which was denied due to a lack of medical certification. 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  Her bipolar disorder has been severe, with significant sleep disturbances, irritability, and frequent crying. She has not been able to follow up with her psychiatrist after an initial appointment and has not seen a therapist. She is currently taking lamotrigine  25 mg daily, doxepin  50 mg at bedtime, and hydroxyzine  25 mg for anxiety. She  reports difficulty sleeping, stating 'I can't sleep for days', and has not received her CPAP machine for sleep apnea. She also mentions overeating, particularly at buffets, as a coping mechanism for stress, contributing to weight gain from 218 to 222 pounds. She engages in walking as a form of exercise.  She requires refills for her asthma medications, including Advair, albuterol , Flonase , and Singulair , and reports being out of puffs for her Advair. She also needs a refill for Senokot, which she uses for constipation, and reports that it works.  She mentions a new onset of vaginal odor without discharge or pain during urination, but reports frequent urination. She reports frequent urination and has experienced UTIs in the past.  She reports dental issues, with broken teeth attributed to her bipolar medication, and experiences pain in her mouth. She has not been able to see a dentist due to cost concerns.  No thoughts of self-harm but significant irritability and frequent crying. She experiences numbness or tingling in her limbs and pain in her lower back. She has conflicts with others, leaving her with only one cousin and one friend.   Past Medical History:  Diagnosis Date   Anxiety    Bipolar 1 disorder (HCC)    PTSD (post-traumatic stress disorder)    Past Surgical History:  Procedure Laterality Date   ABDOMINAL HYSTERECTOMY     Fibroids/heavy menses    No Known Allergies  Allergies as of 08/31/2023   No Known Allergies      Medication List        Accurate as of August 31, 2023 11:59 PM. If you  have any questions, ask your nurse or doctor.          albuterol  108 (90 Base) MCG/ACT inhaler Commonly known as: VENTOLIN  HFA INHALE 1-2 PUFFS BY MOUTH EVERY 6 HOURS AS NEEDED FOR WHEEZE OR SHORTNESS OF BREATH   amLODipine -valsartan  10-320 MG tablet Commonly known as: EXFORGE  Take 1 tablet by mouth daily.   doxepin  50 MG capsule Commonly known as: SINEQUAN  Take 1 capsule (50  mg total) by mouth at bedtime.   fluticasone  50 MCG/ACT nasal spray Commonly known as: FLONASE  Place 1 spray into both nostrils daily.   fluticasone -salmeterol 100-50 MCG/ACT Aepb Commonly known as: ADVAIR Inhale 1 puff into the lungs 2 (two) times daily.   hydrALAZINE  50 MG tablet Commonly known as: APRESOLINE  Take 1 tablet (50 mg total) by mouth 3 (three) times daily.   hydrOXYzine  25 MG capsule Commonly known as: VISTARIL  TAKE ONE CAPSULE BY MOUTH IN THE MORNING AND TWO AT BED TIME   lamoTRIgine  25 MG tablet Commonly known as: LaMICtal  Take one tablet by mouth daily for one week and then 2 tab daily   montelukast  10 MG tablet Commonly known as: SINGULAIR  Take 1 tablet (10 mg total) by mouth at bedtime.   multivitamin capsule Take 1 capsule by mouth daily.   senna-docusate 8.6-50 MG tablet Commonly known as: Senokot-S Take 1 tablet by mouth at bedtime.        Review of Systems  Constitutional:  Negative for appetite change, chills, fatigue, fever and unexpected weight change.  HENT:  Negative for congestion, dental problem, ear discharge, ear pain, facial swelling, hearing loss, nosebleeds, postnasal drip, rhinorrhea, sinus pressure, sinus pain, sneezing, sore throat, tinnitus and trouble swallowing.   Eyes:  Negative for pain, discharge, redness, itching and visual disturbance.  Respiratory:  Negative for cough, chest tightness, shortness of breath and wheezing.   Cardiovascular:  Negative for chest pain, palpitations and leg swelling.  Gastrointestinal:  Negative for abdominal distention, abdominal pain, blood in stool, constipation, diarrhea, nausea and vomiting.  Endocrine: Negative for cold intolerance, heat intolerance, polydipsia, polyphagia and polyuria.  Genitourinary:  Negative for difficulty urinating, dysuria, flank pain, frequency and urgency.  Musculoskeletal:  Negative for arthralgias, back pain, gait problem, joint swelling, myalgias, neck pain and neck  stiffness.  Skin:  Negative for color change, pallor, rash and wound.  Neurological:  Negative for dizziness, syncope, speech difficulty, weakness, light-headedness, numbness and headaches.  Hematological:  Does not bruise/bleed easily.  Psychiatric/Behavioral:  Positive for sleep disturbance. Negative for agitation, behavioral problems, confusion, hallucinations, self-injury and suicidal ideas. The patient is not nervous/anxious.        Increased stress    Immunization History  Administered Date(s) Administered   Influenza, Seasonal, Injecte, Preservative Fre 11/26/2014, 01/31/2023   Influenza,inj,Quad PF,6+ Mos 11/26/2014, 12/15/2016   PNEUMOCOCCAL CONJUGATE-20 05/01/2023   Tdap 02/02/2021   Pertinent  Health Maintenance Due  Topic Date Due   MAMMOGRAM  Never done   Colonoscopy  03/30/2024 (Originally 06/23/2023)   INFLUENZA VACCINE  04/15/2024 (Originally 08/17/2023)      09/14/2022   11:17 AM 09/28/2022    9:23 AM 01/31/2023    3:10 PM 05/01/2023    9:56 AM 08/31/2023   11:01 AM  Fall Risk  Falls in the past year? 0 0 0 0 0  Was there an injury with Fall? 0 0 0 0 0  Fall Risk Category Calculator 0 0 0 0 0  Patient at Risk for Falls Due to No Fall Risks No  Fall Risks  No Fall Risks No Fall Risks  Fall risk Follow up Falls evaluation completed;Education provided;Falls prevention discussed Falls evaluation completed;Education provided;Falls prevention discussed  Falls evaluation completed Falls evaluation completed   Functional Status Survey:    Vitals:   08/31/23 1107 08/31/23 1116  BP: (!) 150/106 (!) 140/110  Pulse: 84   Resp: 19   Temp: 97.9 F (36.6 C)   SpO2: 96%   Weight: 222 lb (100.7 kg)   Height: 5' 5 (1.651 m)    Body mass index is 36.94 kg/m. Physical Exam  VITALS: T- 97.9, P- 84, BP- 150/106, SaO2- 96% MEASUREMENTS: Weight- 222. GENERAL: Alert, cooperative, well developed, no acute distress HEENT: Normocephalic, normal oropharynx, moist mucous membranes,  ears normal bilaterally, nose normal CHEST: Clear to auscultation bilaterally, no wheezes, rhonchi, or crackles CARDIOVASCULAR: Normal heart rate and rhythm, S1 and S2 normal without murmurs ABDOMEN: Tender, soft, non-distended, without organomegaly, normal bowel sounds EXTREMITIES: No cyanosis or edema MUSCULOSKELETAL: Mild lower back pain NEUROLOGICAL: Cranial nerves grossly intact, moves all extremities without gross motor or sensory deficit, neurological exam normal  SKIN: No rash,no lesion or erythema   PSYCHIATRY/BEHAVIORAL: Depressed   Labs reviewed: Recent Labs    01/25/23 1610 04/16/23 1535 08/31/23 1159  NA 138 141 139  K 3.4* 3.9 3.6  CL 106 103 102  CO2 26 24 27   GLUCOSE 134* 85 106*  BUN 9 8 14   CREATININE 0.79 0.87 0.86  CALCIUM 8.8* 9.2 9.5   Recent Labs    04/16/23 1535 08/31/23 1159  AST 19 14  ALT 21 14  ALKPHOS 84  --   BILITOT 0.3 0.4  PROT 6.8 7.2  ALBUMIN 4.3  --    Recent Labs    01/25/23 1610 04/16/23 1535 08/31/23 1159  WBC 4.9 4.7 4.2  NEUTROABS  --  2.7 2,230  HGB 11.3* 11.1 11.7  HCT 38.0 37.4 41.0  MCV 71.6* 73* 75.4*  PLT 295 309 308   Lab Results  Component Value Date   TSH 1.44 08/31/2023   Lab Results  Component Value Date   HGBA1C 5.8 (H) 08/31/2023   Lab Results  Component Value Date   CHOL 196 08/31/2023   HDL 54 08/31/2023   LDLCALC 123 (H) 08/31/2023   TRIG 86 08/31/2023   CHOLHDL 3.6 08/31/2023    Significant Diagnostic Results in last 30 days:  No results found.  Assessment/Plan     Hypertension Blood pressure is elevated at 150/106 and 140/110. Stress related to job insecurity may be contributing to elevated blood pressure.does not take medication at times  - Continue amlodipine  and valsartan  (Xforge 10/320 mg) once daily - Continue hydralazine  50 mg three times daily - Encourage stress management techniques such as exercise and deep breathing  Bipolar disorder with anxiety Bipolar disorder with  anxiety is poorly controlled. Reports increased irritability, crying, and difficulty sleeping. Not currently following up with psychiatrist. - Encourage follow-up with psychiatrist and therapist - Continue lamotrigine  25 mg daily - Continue doxepin  50 mg at bedtime - Continue hydroxyzine  25 mg for anxiety - Advise to seek emergency care if symptoms worsen  Asthma Asthma management requires medication refills. Reports running out of Advair. - Refill Advair - Refill albuterol  - Refill Singulair   Obesity Weight has increased from 218 to 222 pounds. Reports overeating, particularly at buffets, as a coping mechanism for stress. - Encourage regular exercise and healthy eating habits - Discuss potential referral to weight management program  Pre-diabetes Previous A1c  was 5.7, indicating pre-diabetes. Needs re-evaluation to monitor progression. - Order A1c test to reassess glucose control  Sleep apnea, untreated Sleep apnea remains untreated as CPAP machine has not been obtained. - Encourage obtaining CPAP machine to manage sleep apnea  Possible urinary tract infection with vaginal odor and urinary frequency Reports vaginal odor and increased urinary frequency. No discharge noted. Differential includes UTI, possibly related to elevated blood sugar or inadequate hydration. - Obtain urine specimen for analysis to rule out UTI - Encourage adequate hydration and proper hygiene  Dental disease with pain Reports dental pain and broken teeth, possibly related to medication side effects. No current dental follow-up due to cost concerns. - Encourage follow-up with a dentist for evaluation and management  Constipation Constipation is well-managed with Senokot. - Continue Senokot as needed   Family/ staff Communication: Reviewed plan of care with patient verbalized understanding   Labs/tests ordered:  - CBC with Differential/Platelet - CMP with eGFR([facility]) - TSH - Hgb A1C - Lipid panel -  Urine Culture; Future - POC Urinalysis Dipstick  Next Appointment : Return in about 6 months (around 03/02/2024) for medical mangement of chronic issues.SABRA   Spent 30 minutes of Face to face and non-face to face with patient  >50% time spent counseling; reviewing medical record; tests; labs; documentation and developing future plan of care.   Roxan JAYSON Plough, NP

## 2023-09-10 ENCOUNTER — Telehealth: Payer: Self-pay | Admitting: Licensed Clinical Social Worker

## 2023-09-10 ENCOUNTER — Telehealth: Payer: Self-pay | Admitting: Pharmacist

## 2023-09-10 DIAGNOSIS — I10 Essential (primary) hypertension: Secondary | ICD-10-CM

## 2023-09-10 MED ORDER — AMLODIPINE BESYLATE-VALSARTAN 10-320 MG PO TABS: 1.0000 | ORAL_TABLET | Freq: Every day | ORAL | 3 refills | Status: DC

## 2023-09-10 MED ORDER — HYDRALAZINE HCL 50 MG PO TABS: 50.0000 mg | ORAL_TABLET | Freq: Three times a day (TID) | ORAL | 3 refills | Status: DC

## 2023-09-10 NOTE — Telephone Encounter (Signed)
 Will forward message to [facility] in regards to [facility] paperwork  Copied from CRM [id number]. Topic: General - Other >> Sep 10, 2023 11:33 AM Carrielelia G wrote: Reason for CRM: Please call regarding FMLA paperwork, Question 3 needs to be corrected. Please advise

## 2023-09-10 NOTE — Telephone Encounter (Signed)
 Patient notified and agreed.

## 2023-09-10 NOTE — Telephone Encounter (Signed)
 Message routed to clinical intake for today Donzell.M/CMA as RICK.

## 2023-09-10 NOTE — Telephone Encounter (Signed)
 Patient called back.  States she did not call because she misplaced the paperwork with my phone number on it.  Said she picked up some medication from the pharmacy but it was not her blood pressure medicine.  I will resend the prescriptions for amlodipine /valsartan  and hydralazine .  She states that her phone is messing up and she has a temporary phone.  Provided me with that phone number [phone]. I advised her to pick up her prescriptions today and start them right away.  Follow-up with me on 4 September.  Reminded her again to call her insurance to verify that they do not cover a blood pressure cuff.

## 2023-09-10 NOTE — Telephone Encounter (Signed)
 Patient never called with the list of her medications she was taking. Her number in the chart is disconnected. Called and LVM on Sons VM (per DPR).

## 2023-09-10 NOTE — Telephone Encounter (Signed)
 Copied from CRM #[id number]. Topic: Clinical - Lab/Test Results >> Sep 10, 2023 11:37 AM Carrielelia G wrote: Reason for CRM:  Please call patient regarding her lab results, you spoke with her son but she stated she would like a call back.   Please advise.

## 2023-09-10 NOTE — Progress Notes (Signed)
 Heart and Vascular Care Navigation  09/10/2023  Deanna Powell [birth year] [id number]  Reason for Referral: SDOH needs Patient is participating in a Managed Medicaid Plan:No, BCBS commercial plan  Engaged with patient by telephone for initial visit for Heart and Vascular Care Coordination.                                                                                                   Assessment:                     LCSW was able to reach pt at new number today [phone]. Introduced self, role, reason for call. 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 She resides on a couch at a friend's home. She pays them rent. She uses [employer] to get to and from work. Her current income makes her ineligible for Medicaid, and previously ineligible for SNAP. She feels frustrated by not making enough to find stable housing etc. She confirms it is okay for us  to keep her son, friend and mother on emergency contact list. She is interested perhaps in [facility] but [facility] since [facility]. However cost and tracking what medications she has/needs has been difficult. Pt has hx of mental health that is intermittantly unmanaged. She feels she hasn't slept well in awhile and would like to be re-established with [facility]. I will send their scheduling team a message. She is okay with me sending resources via MyChart for housing, food, financial assistance.                   HRT/VAS Care Coordination     Patients Home Cardiology Office --  [facility]   Outpatient Care Team Social Worker   Social Worker Name: Marit Lark, [place], [phone]   Living arrangements for the past 2 months Single Family Home   Lives with: Friends   Patient Current Insurance Scientist, water quality   Patient Has Concern With Paying Medical Bills No   Does Patient Have Prescription Coverage? Yes       Social History:                                                                              SDOH Screenings   Food Insecurity: Food Insecurity Present (09/10/2023)  Housing: High Risk (09/10/2023)  Transportation Needs: No Transportation Needs (09/10/2023)  Utilities: Not At Risk (09/10/2023)  Depression (PHQ2-9): Medium Risk (08/31/2023)  Financial Resource Strain: High Risk (09/10/2023)  Tobacco Use: Low Risk  (08/31/2023)  Health Literacy: Adequate Health Literacy (09/10/2023)    SDOH Interventions: Financial Resources:  Surveyor, quantity Strain Interventions: [employer] Provided DSS for financial assistance and Environmental manager, housing resources  Food Insecurity:  Food  Insecurity Interventions: [employer] Provided  Housing Insecurity:  Housing Interventions: Programmer, applications Provided  Transportation:   Transportation Interventions: Other (Comment) (uses [employer], will send employment transportation information through county)   Other Care Navigation Interventions:     Provided Pharmacy assistance resources  Discussed [facility] pharmacy for mail delivery/tracking for fills  Patient expressed Mental Health concerns Yes, Referred to:  [facility] scheduling to be rescheduled, having challenges currently with sleep   Follow-up plan:   LCSW was able to reach scheduling for [facility] who called pt back. I will send resources on separate encounter as several non related messages have been documented on this encounter.

## 2023-09-11 NOTE — Telephone Encounter (Signed)
 She is not a patient of [facility], and we do not have her fmla paperwork.

## 2023-09-12 ENCOUNTER — Telehealth: Payer: Self-pay

## 2023-09-12 NOTE — Telephone Encounter (Signed)
 FMLA documentation has been printed off from [employer] filled out and placed in providers folder for form completion regarding patient. Routed to patient's pcp Ngetich, Roxan BROCKS, NP

## 2023-09-12 NOTE — Telephone Encounter (Signed)
 Per Chrae , documentation was left on pcp's desk for further completion.

## 2023-09-12 NOTE — Telephone Encounter (Signed)
 FMLA paper work were faxed on 09/10/2023.see previous messages.please call and verify if paper work was received.May re-faxed paperwork.

## 2023-09-12 NOTE — Telephone Encounter (Signed)
 Call returned to [facility], left message requesting a call back. Reason for my call was to ask Deanna Powell if she could fax the paperwork that she has for us  to review and give to the provider to add additional information.

## 2023-09-12 NOTE — Telephone Encounter (Signed)
 Copied from CRM #[id number]. Topic: General - Other >> Sep 10, 2023 11:33 AM Carrielelia G wrote: Reason for CRM: Please call regarding FMLA paperwork, Question 3 needs to be corrected. Please advise >> Sep 12, 2023 12:54 PM Brittney F wrote: Caller: Yomar ( FMLA Case Manager)  Calling From: [employer]   Concern:   Calling to confirm that the information that they received was incomplete. Please add the following details to ensure paperwork is accepted:   1. Question 3 Part C : Start Date needs to be listed as 07/06/2023  Parameters : Need to include several days instead of 8 hours due to the patient needing more than a day to recover.    Please make corrections and initial next to correction and fax back the documentation.   Callback Number: [phone] Fax Number: [phone]

## 2023-09-12 NOTE — Telephone Encounter (Signed)
 Faxed information back to [employer] due to no phone number and wrote  paperwork has been completed on 09/10/23

## 2023-09-13 NOTE — Telephone Encounter (Signed)
 Noted

## 2023-09-13 NOTE — Telephone Encounter (Signed)
 FMLA paperwork 3 Part C start date changed to 07/06/2023 and added several days instead of 8 days as requested. Completed paperwork placed on outgoing fax box.Please fax to [employer].

## 2023-09-19 NOTE — Progress Notes (Unsigned)
 Patient ID: Envy Meno                 DOB: 21-Jan-1980                      MRN: [id number]      HPI: Deanna Powell is a 43 y.o. female referred by Dr. Ladona to HTN clinic. PMH is significant for bipolar disorder, hypertension, reactive airway disease.  Previously financial constraints contributed to noncompliance.   Patient last saw HTN clinic 09/06/23. At this visit, patient's BP was 180/122. Patient stated that she was going through a manic episode and was under a lot of stress due to personal matters going on. It was unclear if she had been taking her BP medication as prescribed. Patient later admitted she did not have any of her blood pressure medications and was not taking.  A new rx was sent for amlodipine -valsartan  and hydralazine  for patient to pick up. Informed her to contact insurance about a BP cuff.  Today patient presents in good spirits. She has been able to pick up her medications since last HTN visit. She confirms that she has been taking her medications as directed - amlodipine -hydrochlorothiazide  in the morning, hydralazine  50 mg TID. It has been about a week since she has picked up her medications and reports still experiencing headaches and dizziness. Dizziness normally occurs when she is getting up from long period of sitting. Advised patient to slowly get up from sitting to avoid dizziness.  She has not been able to check her BP at home. Reports that she has not been able to get in touch with her insurance company for a blood pressure cuff. Reports feeling stressed as she does not have transportation of her own resulting in [place]. Current job is not paying enough either which has caused a delay in obtaining a BP cuff. States that she has been able to reduce sodium intake, not using slap yo mama seasoning much anymore.  Patient mentions how her sister was hospitalized for for CHF event. She feels like this is a wake up call for herself to get her BP under control to prevent  any hospitalizations.  Current HTN meds: Amlodipine /valsartan  10/320 daily, hydralazine  50 mg 3 times a day  Previously tried: Lisinopril , labetalol , spironolactone -hydrochlorothiazide  (stopped due to cost) BP goal: <130/80  Family History:  Family History  Problem Relation Age of Onset   Diabetes Father    Cirrhosis Father    Cancer Cousin 23       breast cancer   Cancer Cousin 36       breast cancer paternal cousin     Social History:  Social History   Socioeconomic History   Marital status: Single    Spouse name: 2   Number of children: Not on file   Years of education: Not on file   Highest education level: Not on file  Occupational History   Not on file  Tobacco Use   Smoking status: Never    Passive exposure: Never   Smokeless tobacco: Never  Vaping Use   Vaping status: Never Used  Substance and Sexual Activity   Alcohol use: Not Currently    Comment: occasionally   Drug use: No   Sexual activity: Not Currently    Birth control/protection: Surgical  Other Topics Concern   Not on file  Social History Narrative   Not on file   Social Drivers of Health   Financial Resource Strain: High Risk (09/10/2023)  Overall Financial Resource Strain (CARDIA)    Difficulty of Paying Living Expenses: Hard  Food Insecurity: Food Insecurity Present (09/10/2023)   Hunger Vital Sign    Worried About Running Out of Food in the Last Year: Sometimes true    Ran Out of Food in the Last Year: Sometimes true  Transportation Needs: No Transportation Needs (09/10/2023)   PRAPARE - Administrator, Civil Service (Medical): No    Lack of Transportation (Non-Medical): No  Physical Activity: Not on file  Stress: Not on file (01/05/2023)  Social Connections: Not on file  Intimate Partner Violence: Not on file    Diet:  Mountain dew- typically 4 bottles a day, states that she is trying to wean off of them Breakfast: boiled eggs, bacon (4) and cheese toast Lunch:  microwave dinners Dinner: take out- chinese food/mexican  Exercise:  Walks about a mile when able, typically takes 30 minutes   Wt Readings from Last 3 Encounters:  08/31/23 222 lb (100.7 kg)  06/06/23 218 lb 6.4 oz (99.1 kg)  05/18/23 217 lb 6.4 oz (98.6 kg)   BP Readings from Last 3 Encounters:  09/20/23 (!) 170/120  09/06/23 (!) 180/122  08/31/23 (!) 140/110   Pulse Readings from Last 3 Encounters:  09/20/23 80  09/06/23 91  08/31/23 84    Renal function: CrCl cannot be calculated (Unknown ideal weight.).  Past Medical History:  Diagnosis Date   Anxiety    Bipolar 1 disorder (HCC)    PTSD (post-traumatic stress disorder)     Current Outpatient Medications on File Prior to Visit  Medication Sig Dispense Refill   albuterol  (VENTOLIN  HFA) 108 (90 Base) MCG/ACT inhaler INHALE 1-2 PUFFS BY MOUTH EVERY 6 HOURS AS NEEDED FOR WHEEZE OR SHORTNESS OF BREATH 18 each 3   amLODipine -valsartan  (EXFORGE ) 10-320 MG tablet Take 1 tablet by mouth daily. 90 tablet 3   doxepin  (SINEQUAN ) 50 MG capsule Take 1 capsule (50 mg total) by mouth at bedtime. 30 capsule 2   fluticasone  (FLONASE ) 50 MCG/ACT nasal spray Place 1 spray into both nostrils daily. 16 g 6   fluticasone -salmeterol (ADVAIR) 100-50 MCG/ACT AEPB Inhale 1 puff into the lungs 2 (two) times daily. 1 each 3   hydrALAZINE  (APRESOLINE ) 50 MG tablet Take 1 tablet (50 mg total) by mouth 3 (three) times daily. 90 tablet 3   hydrOXYzine  (VISTARIL ) 25 MG capsule TAKE ONE CAPSULE BY MOUTH IN THE MORNING AND TWO AT BED TIME 270 capsule 1   lamoTRIgine  (LAMICTAL ) 25 MG tablet Take one tablet by mouth daily for one week and then 2 tab daily 60 tablet 0   montelukast  (SINGULAIR ) 10 MG tablet Take 1 tablet (10 mg total) by mouth at bedtime. 90 tablet 1   Multiple Vitamin (MULTIVITAMIN) capsule Take 1 capsule by mouth daily. 90 capsule 3   senna-docusate (SENOKOT-S) 8.6-50 MG tablet Take 1 tablet by mouth at bedtime. 30 tablet 3   No current  facility-administered medications on file prior to visit.    No Known Allergies  Blood pressure (!) 170/120, pulse 80.   Assessment/Plan: HYPERTENSION CONTROL Vitals:   09/20/23 1147 09/20/23 1151  BP: (!) 165/120 (!) 170/120    The patient's blood pressure is elevated above target today.  In order to address the patient's elevated BP: A new medication was prescribed today.      1. Hypertension -  Essential hypertension Assessment: BP is currently uncontrolled today in office BP goal is <130/80 BP has slightly decreased  with being on amlodipine -valsartan  and hydralazine  Patient reports being adherent to medication therapy- may not have seen max effect of medications yet Obtain BMP labs to assess electrolytes and renal function Encouraged patient to purchase a BP cuff to measure at home and keep a log for next visit- She was encouraged to call her insurance to see if they would cover the cost. She was advised that our pharmacy sells for $35. Rx sent to pharmacy to see if they could run rx.  Plan: Initiate hydrochlorothiazide  25 mg daily Continue amlodipine /valsartan  10/320 daily, hydralazine  50 mg 3 times a day  Follow up in HTN clinic in  2 weeks    Jenkins Graces, PharmD PGY1 Pharmacy Resident [phone]   Thank you  Melissa D Maccia, Pharm.JONETTA SARAN, CPP [facility] A Division of  [facility] [address], [place], [place] [address]  Phone: [phone]; Fax: [phone]

## 2023-09-20 ENCOUNTER — Other Ambulatory Visit (HOSPITAL_COMMUNITY): Payer: Self-pay

## 2023-09-20 ENCOUNTER — Ambulatory Visit: Attending: Cardiology | Admitting: Pharmacist

## 2023-09-20 VITALS — BP 170/120 | HR 80

## 2023-09-20 DIAGNOSIS — I10 Essential (primary) hypertension: Secondary | ICD-10-CM | POA: Diagnosis not present

## 2023-09-20 MED ORDER — HYDROCHLOROTHIAZIDE 25 MG PO TABS
25.0000 mg | ORAL_TABLET | Freq: Every day | ORAL | 3 refills | Status: DC
  Filled 2023-09-20: qty 90, 90d supply, fill #0
  Filled 2023-10-05: qty 30, 30d supply, fill #0

## 2023-09-20 MED ORDER — OMRON 3 SERIES BP MONITOR DEVI
1.0000 | Freq: Every day | 0 refills | Status: AC
  Filled 2023-09-20: qty 1, 30d supply, fill #0

## 2023-09-20 NOTE — Assessment & Plan Note (Addendum)
 Assessment: BP is currently uncontrolled today in office BP goal is <130/80 BP has slightly decreased with being on amlodipine -valsartan  and hydralazine  Patient reports being adherent to medication therapy- may not have seen max effect of medications yet Obtain BMP labs to assess electrolytes and renal function Encouraged patient to purchase a BP cuff to measure at home and keep a log for next visit- She was encouraged to call her insurance to see if they would cover the cost. She was advised that our pharmacy sells for $35. Rx sent to pharmacy to see if they could run rx.  Plan: Initiate hydrochlorothiazide  25 mg daily Continue amlodipine /valsartan  10/320 daily, hydralazine  50 mg 3 times a day  Follow up in HTN clinic in  2 weeks

## 2023-09-20 NOTE — Patient Instructions (Addendum)
 Your blood pressure goal is < 130/36mmHg   Please start taking hydrochlorothiazide  25mg  daily Please continue amlodipine /valsartan  10/320 daily, hydralazine  50 mg 3 times a day  Please try to get a blood pressure cuff and check blood pressure once -twice a day  Important lifestyle changes to control high blood pressure  Intervention  Effect on the BP   Weight loss Weight loss is one of the most effective lifestyle changes for controlling blood pressure. If you're overweight or obese, losing even a small amount of weight can help reduce blood pressure.    Blood pressure can decrease by 1 millimeter of mercury (mmHg) with each kilogram (about 2.2 pounds) of weight lost.   Exercise regularly As a general goal, aim for 30 minutes of moderate physical activity every day.    Regular physical activity can lower blood pressure by 5 - 8 mmHg.   Eat a healthy diet Eat a diet rich in whole grains, fruits, vegetables, lean meat, and low-fat dairy products. Limit processed foods, saturated fat, and sweets.    A heart-healthy diet can lower high blood pressure by 10 mmHg.   Reduce salt (sodium) in your diet Aim for 000mg  of sodium each day. Avoid deli meats, canned food, and frozen microwave meals which are high in sodium.     Limiting sodium can reduce blood pressure by 5 mmHg.   Limit alcohol One drink equals 12 ounces of beer, 5 ounces of wine, or 1.5 ounces of 80-proof liquor.    Limiting alcohol to < 1 drink a day for women or < 2 drinks a day for men can help lower blood pressure by about 4 mmHg.   To check your pressure at home you will need to:   Sit up in a chair, with feet flat on the floor and back supported. Do not cross your ankles or legs. Rest your left arm so that the cuff is about heart level. If the cuff goes on your upper arm, then just relax your arm on the table, arm of the chair, or your lap. If you have a wrist cuff, hold your wrist against your chest at heart  level. Place the cuff snugly around your arm, about 1 inch above the crease of your elbow. The cords should be inside the groove of your elbow.  Sit quietly, with the cuff in place, for about 5 minutes. Then press the power button to start a reading. Do not talk or move while the reading is taking place.  Record your readings on a sheet of paper. Although most cuffs have a memory, it is often easier to see a pattern developing when the numbers are all in front of you.  You can repeat the reading after 1-3 minutes if it is recommended.   Make sure your bladder is empty and you have not had caffeine or tobacco within the last 30 minutes   Always bring your blood pressure log with you to your appointments. If you have not brought your monitor in to be double checked for accuracy, please bring it to your next appointment.   You can find a list of validated (accurate) blood pressure cuffs at: validatebp.org

## 2023-10-01 ENCOUNTER — Other Ambulatory Visit (HOSPITAL_COMMUNITY): Payer: Self-pay

## 2023-10-04 ENCOUNTER — Ambulatory Visit: Admitting: Pharmacist

## 2023-10-04 NOTE — Progress Notes (Deleted)
 Patient ID: Deanna Powell                 DOB: June 27, 1980                      MRN: [id number]      HPI: Deanna Powell is a 43 y.o. female referred by Dr. Ladona to HTN clinic. PMH is significant for bipolar disorder, hypertension, reactive airway disease.  Previously financial constraints contributed to noncompliance.   Patient last saw HTN clinic 09/06/23. At this visit, patient's BP was 180/122. Patient stated that she was going through a manic episode and was under a lot of stress due to personal matters going on. It was unclear if she had been taking her BP medication as prescribed. Patient later admitted she did not have any of her blood pressure medications and was not taking.  A new rx was sent for amlodipine -valsartan  and hydralazine  for patient to pick up. Informed her to contact insurance about a BP cuff.  Patient was last seen 9//25.  Blood pressure in clinic 170/120.  Had not been able to get a blood pressure cuff yet.  She had resumed her blood pressure medications about a week prior.  Was working on trying to decrease her sodium intake.  Patient was sent for baseline labs and hydrochlorothiazide  25 mg daily was started.  However BMP was never drawn.  Needs BMP Dizziness, lightheadedness, headache, blurred vision, SOB, swelling Home bp? Did she get a cuff?  Today patient presents in good spirits. She has been able to pick up her medications since last HTN visit. She confirms that she has been taking her medications as directed - amlodipine -hydrochlorothiazide  in the morning, hydralazine  50 mg TID. It has been about a week since she has picked up her medications and reports still experiencing headaches and dizziness. Dizziness normally occurs when she is getting up from long period of sitting. Advised patient to slowly get up from sitting to avoid dizziness.  She has not been able to check her BP at home. Reports that she has not been able to get in touch with her insurance company for a  blood pressure cuff. Reports feeling stressed as she does not have transportation of her own resulting in [place]. Current job is not paying enough either which has caused a delay in obtaining a BP cuff. States that she has been able to reduce sodium intake, not using slap yo mama seasoning much anymore.  Patient mentions how her sister was hospitalized for for CHF event. She feels like this is a wake up call for herself to get her BP under control to prevent any hospitalizations.  Current HTN meds: Amlodipine /valsartan  10/320 daily, hydralazine  50 mg 3 times a day, hydrochlorothiazide  25mg  daily Previously tried: Lisinopril , labetalol , spironolactone -hydrochlorothiazide  (stopped due to cost) BP goal: <130/80  Family History:  Family History  Problem Relation Age of Onset   Diabetes Father    Cirrhosis Father    Cancer Cousin 52       breast cancer   Cancer Cousin 41       breast cancer paternal cousin     Social History:  Social History   Socioeconomic History   Marital status: Single    Spouse name: 2   Number of children: Not on file   Years of education: Not on file   Highest education level: Not on file  Occupational History   Not on file  Tobacco Use   Smoking status: Never  Passive exposure: Never   Smokeless tobacco: Never  Vaping Use   Vaping status: Never Used  Substance and Sexual Activity   Alcohol use: Not Currently    Comment: occasionally   Drug use: No   Sexual activity: Not Currently    Birth control/protection: Surgical  Other Topics Concern   Not on file  Social History Narrative   Not on file   Social Drivers of Health   Financial Resource Strain: High Risk (09/10/2023)   Overall Financial Resource Strain (CARDIA)    Difficulty of Paying Living Expenses: Hard  Food Insecurity: Food Insecurity Present (09/10/2023)   Hunger Vital Sign    Worried About Running Out of Food in the Last Year: Sometimes true    Ran Out of Food in the Last Year:  Sometimes true  Transportation Needs: No Transportation Needs (09/10/2023)   PRAPARE - Administrator, Civil Service (Medical): No    Lack of Transportation (Non-Medical): No  Physical Activity: Not on file  Stress: Not on file (01/05/2023)  Social Connections: Not on file  Intimate Partner Violence: Not on file    Diet:  Mountain dew- typically 4 bottles a day, states that she is trying to wean off of them Breakfast: boiled eggs, bacon (4) and cheese toast Lunch: microwave dinners Dinner: take out- chinese food/mexican  Exercise:  Walks about a mile when able, typically takes 30 minutes   Wt Readings from Last 3 Encounters:  08/31/23 222 lb (100.7 kg)  06/06/23 218 lb 6.4 oz (99.1 kg)  05/18/23 217 lb 6.4 oz (98.6 kg)   BP Readings from Last 3 Encounters:  09/20/23 (!) 170/120  09/06/23 (!) 180/122  08/31/23 (!) 140/110   Pulse Readings from Last 3 Encounters:  09/20/23 80  09/06/23 91  08/31/23 84    Renal function: CrCl cannot be calculated (Patient's most recent lab result is older than the maximum 21 days allowed.).  Past Medical History:  Diagnosis Date   Anxiety    Bipolar 1 disorder (HCC)    PTSD (post-traumatic stress disorder)     Current Outpatient Medications on File Prior to Visit  Medication Sig Dispense Refill   albuterol  (VENTOLIN  HFA) 108 (90 Base) MCG/ACT inhaler INHALE 1-2 PUFFS BY MOUTH EVERY 6 HOURS AS NEEDED FOR WHEEZE OR SHORTNESS OF BREATH 18 each 3   amLODipine -valsartan  (EXFORGE ) 10-320 MG tablet Take 1 tablet by mouth daily. 90 tablet 3   Blood Pressure Monitoring (OMRON 3 SERIES BP MONITOR) DEVI Use to measure blood pressure as directed. 1 each 0   doxepin  (SINEQUAN ) 50 MG capsule Take 1 capsule (50 mg total) by mouth at bedtime. 30 capsule 2   fluticasone  (FLONASE ) 50 MCG/ACT nasal spray Place 1 spray into both nostrils daily. 16 g 6   fluticasone -salmeterol (ADVAIR) 100-50 MCG/ACT AEPB Inhale 1 puff into the lungs 2 (two)  times daily. 1 each 3   hydrALAZINE  (APRESOLINE ) 50 MG tablet Take 1 tablet (50 mg total) by mouth 3 (three) times daily. 90 tablet 3   hydrochlorothiazide  (HYDRODIURIL ) 25 MG tablet Take 1 tablet (25 mg total) by mouth daily. 90 tablet 3   hydrOXYzine  (VISTARIL ) 25 MG capsule TAKE ONE CAPSULE BY MOUTH IN THE MORNING AND TWO AT BED TIME 270 capsule 1   lamoTRIgine  (LAMICTAL ) 25 MG tablet Take one tablet by mouth daily for one week and then 2 tab daily 60 tablet 0   montelukast  (SINGULAIR ) 10 MG tablet Take 1 tablet (10 mg total) by  mouth at bedtime. 90 tablet 1   Multiple Vitamin (MULTIVITAMIN) capsule Take 1 capsule by mouth daily. 90 capsule 3   senna-docusate (SENOKOT-S) 8.6-50 MG tablet Take 1 tablet by mouth at bedtime. 30 tablet 3   No current facility-administered medications on file prior to visit.    No Known Allergies  There were no vitals taken for this visit.   Assessment/Plan: No BP recorded.  {Refresh Note OR Click here to enter BP  :1}***   1. Hypertension -  No problem-specific Assessment & Plan notes found for this encounter.     Thank you  Shondell Poulson D Juliyah Mergen, Pharm.JONETTA SARAN, CPP [facility] Division of [facility] [address], [place], [place] [address]  Phone: [phone]; Fax: [phone]

## 2023-10-05 ENCOUNTER — Ambulatory Visit: Attending: Cardiovascular Disease | Admitting: Pharmacist

## 2023-10-05 ENCOUNTER — Other Ambulatory Visit (HOSPITAL_COMMUNITY): Payer: Self-pay

## 2023-10-05 VITALS — BP 180/120

## 2023-10-05 DIAGNOSIS — I10 Essential (primary) hypertension: Secondary | ICD-10-CM

## 2023-10-05 LAB — BASIC METABOLIC PANEL WITH GFR
BUN/Creatinine Ratio: 14 (ref 9–23)
BUN: 13 mg/dL (ref 6–24)
CO2: 22 mmol/L (ref 20–29)
Calcium: 9.2 mg/dL (ref 8.7–10.2)
Chloride: 105 mmol/L (ref 96–106)
Creatinine, Ser: 0.92 mg/dL (ref 0.57–1.00)
Glucose: 91 mg/dL (ref 70–99)
Potassium: 4 mmol/L (ref 3.5–5.2)
Sodium: 143 mmol/L (ref 134–144)
eGFR: 78 mL/min/1.73 (ref >59)

## 2023-10-05 NOTE — Assessment & Plan Note (Addendum)
 Assessment: Blood pressure still significantly elevated in clinic today Patient did not start hydrochlorothiazide  as directed last time No signs and symptoms of hypertensive emergency Patient unable to get a blood pressure cuff through her insurance per her report.  Provided with a blood pressure cuff and batteries from the patient care fund.  I taught patient how to use blood pressure cuff  Plan: Patient was instructed to go downstairs and pick up her hydrochlorothiazide  and start right away Continue amlodipine /valsartan  10/320 daily, hydralazine  50 mg 3 times a day Start checking blood pressure at home Get BMP today Since her appointment next time is later in the day and she will have to get to work I asked her to go to the lab before her appointment Follow-up in 1 week

## 2023-10-05 NOTE — Patient Instructions (Addendum)
 Your blood pressure goal is < 130/1mmHg   Please start hydrochlorothiazide  25mg  daily Continue Amlodipine /valsartan  10/320 daily, hydralazine  50 mg 3 times a day  Start checking blood pressure twice a day Please go to the lab today and before your next appointment   Important lifestyle changes to control high blood pressure  Intervention  Effect on the BP   Weight loss Weight loss is one of the most effective lifestyle changes for controlling blood pressure. If you're overweight or obese, losing even a small amount of weight can help reduce blood pressure.    Blood pressure can decrease by 1 millimeter of mercury (mmHg) with each kilogram (about 2.2 pounds) of weight lost.   Exercise regularly As a general goal, aim for 30 minutes of moderate physical activity every day.    Regular physical activity can lower blood pressure by 5 - 8 mmHg.   Eat a healthy diet Eat a diet rich in whole grains, fruits, vegetables, lean meat, and low-fat dairy products. Limit processed foods, saturated fat, and sweets.    A heart-healthy diet can lower high blood pressure by 10 mmHg.   Reduce salt (sodium) in your diet Aim for 000mg  of sodium each day. Avoid deli meats, canned food, and frozen microwave meals which are high in sodium.     Limiting sodium can reduce blood pressure by 5 mmHg.   Limit alcohol One drink equals 12 ounces of beer, 5 ounces of wine, or 1.5 ounces of 80-proof liquor.    Limiting alcohol to < 1 drink a day for women or < 2 drinks a day for men can help lower blood pressure by about 4 mmHg.   To check your pressure at home you will need to:   Sit up in a chair, with feet flat on the floor and back supported. Do not cross your ankles or legs. Rest your left arm so that the cuff is about heart level. If the cuff goes on your upper arm, then just relax your arm on the table, arm of the chair, or your lap. If you have a wrist cuff, hold your wrist against your chest at  heart level. Place the cuff snugly around your arm, about 1 inch above the crease of your elbow. The cords should be inside the groove of your elbow.  Sit quietly, with the cuff in place, for about 5 minutes. Then press the power button to start a reading. Do not talk or move while the reading is taking place.  Record your readings on a sheet of paper. Although most cuffs have a memory, it is often easier to see a pattern developing when the numbers are all in front of you.  You can repeat the reading after 1-3 minutes if it is recommended.   Make sure your bladder is empty and you have not had caffeine or tobacco within the last 30 minutes   Always bring your blood pressure log with you to your appointments. If you have not brought your monitor in to be double checked for accuracy, please bring it to your next appointment.   You can find a list of validated (accurate) blood pressure cuffs at: validatebp.org

## 2023-10-05 NOTE — Progress Notes (Signed)
 Patient ID: Deanna Powell                 DOB: [birth year]                      MRN: [id number]      HPI: Deanna Powell is a 43 y.o. female referred by Dr. Ladona to HTN clinic. PMH is significant for bipolar disorder, hypertension, reactive airway disease.  Previously financial constraints contributed to noncompliance.   Patient last saw HTN clinic 09/06/23. At this visit, patient's BP was 180/122. Patient stated that she was going through a manic episode and was under a lot of stress due to personal matters going on. It was unclear if she had been taking her BP medication as prescribed. Patient later admitted she did not have any of her blood pressure medications and was not taking.  A new rx was sent for amlodipine -valsartan  and hydralazine  for patient to pick up. Informed her to contact insurance about a BP cuff.  Patient was last seen 9//25.  Blood pressure in clinic 170/120.  Had not been able to get a blood pressure cuff yet.  She had resumed her blood pressure medications about a week prior.  Was working on trying to decrease her sodium intake.  Patient was sent for baseline labs and hydrochlorothiazide  25 mg daily was started.  However BMP was never drawn.  Patient presents today to clinic for follow-up.  Still unable to get a blood pressure cuff.  She also did not get her hydrochlorothiazide  as when she went to the pharmacy they told her it was $39.  However this is the cause of the blood pressure cuff and not her medication.  She also did not get her labs drawn last time because the wait was too long and she had to go to work.  Still concerned about costs as she still has to [employer].  I gave her information on using [employer].   Denies any dizziness or headaches.  Reports increase in urination.  She reports compliance with all of her other blood pressure medicines.  Reports that she started exercising.   Current HTN meds: Amlodipine /valsartan  10/320 daily, hydralazine  50 mg 3 times a day,  hydrochlorothiazide  25mg  daily (not taking) Previously tried: Lisinopril , labetalol , spironolactone -hydrochlorothiazide  (stopped due to cost) BP goal: <130/80  Family History:  Family History  Problem Relation Age of Onset   Diabetes Father    Cirrhosis Father    Cancer Cousin 50       breast cancer   Cancer Cousin 33       breast cancer paternal cousin     Social History:  Social History   Socioeconomic History   Marital status: Single    Spouse name: 2   Number of children: Not on file   Years of education: Not on file   Highest education level: Not on file  Occupational History   Not on file  Tobacco Use   Smoking status: Never    Passive exposure: Never   Smokeless tobacco: Never  Vaping Use   Vaping status: Never Used  Substance and Sexual Activity   Alcohol use: Not Currently    Comment: occasionally   Drug use: No   Sexual activity: Not Currently    Birth control/protection: Surgical  Other Topics Concern   Not on file  Social History Narrative   Not on file   Social Drivers of Health   Financial Resource Strain: High Risk (09/10/2023)  Overall Financial Resource Strain (CARDIA)    Difficulty of Paying Living Expenses: Hard  Food Insecurity: Food Insecurity Present (09/10/2023)   Hunger Vital Sign    Worried About Running Out of Food in the Last Year: Sometimes true    Ran Out of Food in the Last Year: Sometimes true  Transportation Needs: No Transportation Needs (09/10/2023)   PRAPARE - Administrator, Civil Service (Medical): No    Lack of Transportation (Non-Medical): No  Physical Activity: Not on file  Stress: Not on file (01/05/2023)  Social Connections: Not on file  Intimate Partner Violence: Not on file    Diet:  Mountain dew- typically 4 bottles a day, states that she is trying to wean off of them Breakfast: boiled eggs, bacon (4) and cheese toast Lunch: microwave dinners Dinner: take out- chinese food/mexican  Exercise:   Walks about a mile when able, typically takes 30 minutes   Wt Readings from Last 3 Encounters:  08/31/23 222 lb (100.7 kg)  06/06/23 218 lb 6.4 oz (99.1 kg)  05/18/23 217 lb 6.4 oz (98.6 kg)   BP Readings from Last 3 Encounters:  10/05/23 (!) 180/120  09/20/23 (!) 170/120  09/06/23 (!) 180/122   Pulse Readings from Last 3 Encounters:  09/20/23 80  09/06/23 91  08/31/23 84    Renal function: CrCl cannot be calculated (Patient's most recent lab result is older than the maximum 21 days allowed.).  Past Medical History:  Diagnosis Date   Anxiety    Bipolar 1 disorder (HCC)    PTSD (post-traumatic stress disorder)     Current Outpatient Medications on File Prior to Visit  Medication Sig Dispense Refill   albuterol  (VENTOLIN  HFA) 108 (90 Base) MCG/ACT inhaler INHALE 1-2 PUFFS BY MOUTH EVERY 6 HOURS AS NEEDED FOR WHEEZE OR SHORTNESS OF BREATH 18 each 3   amLODipine -valsartan  (EXFORGE ) 10-320 MG tablet Take 1 tablet by mouth daily. 90 tablet 3   Blood Pressure Monitoring (OMRON 3 SERIES BP MONITOR) DEVI Use to measure blood pressure as directed. 1 each 0   doxepin  (SINEQUAN ) 50 MG capsule Take 1 capsule (50 mg total) by mouth at bedtime. 30 capsule 2   fluticasone  (FLONASE ) 50 MCG/ACT nasal spray Place 1 spray into both nostrils daily. 16 g 6   fluticasone -salmeterol (ADVAIR) 100-50 MCG/ACT AEPB Inhale 1 puff into the lungs 2 (two) times daily. 1 each 3   hydrALAZINE  (APRESOLINE ) 50 MG tablet Take 1 tablet (50 mg total) by mouth 3 (three) times daily. 90 tablet 3   hydrochlorothiazide  (HYDRODIURIL ) 25 MG tablet Take 1 tablet (25 mg total) by mouth daily. 90 tablet 3   hydrOXYzine  (VISTARIL ) 25 MG capsule TAKE ONE CAPSULE BY MOUTH IN THE MORNING AND TWO AT BED TIME 270 capsule 1   lamoTRIgine  (LAMICTAL ) 25 MG tablet Take one tablet by mouth daily for one week and then 2 tab daily 60 tablet 0   montelukast  (SINGULAIR ) 10 MG tablet Take 1 tablet (10 mg total) by mouth at bedtime. 90  tablet 1   Multiple Vitamin (MULTIVITAMIN) capsule Take 1 capsule by mouth daily. 90 capsule 3   senna-docusate (SENOKOT-S) 8.6-50 MG tablet Take 1 tablet by mouth at bedtime. 30 tablet 3   No current facility-administered medications on file prior to visit.    No Known Allergies  Blood pressure (!) 180/120.   Assessment/Plan: HYPERTENSION CONTROL Vitals:   10/05/23 1218 10/05/23 1219  BP: (!) 170/130 (!) 180/120    The patient's blood  pressure is elevated above target today.  In order to address the patient's elevated BP: A new medication was prescribed today.      1. Hypertension -  Essential hypertension Assessment: Blood pressure still significantly elevated in clinic today Patient did not start hydrochlorothiazide  as directed last time No signs and symptoms of hypertensive emergency Patient unable to get a blood pressure cuff through her insurance per her report.  Provided with a blood pressure cuff and batteries from the patient care fund.  I taught patient how to use blood pressure cuff  Plan: Patient was instructed to go downstairs and pick up her hydrochlorothiazide  and start right away Continue amlodipine /valsartan  10/320 daily, hydralazine  50 mg 3 times a day Start checking blood pressure at home Get BMP today Since her appointment next time is later in the day and she will have to get to work I asked her to go to the lab before her appointment Follow-up in 1 week    Thank you  Donelle Hise D Eual Lindstrom, Pharm.JONETTA SARAN, CPP [facility] A Division of [facility] [address], [place], [place] [address]  Phone: [phone]; Fax: [phone]

## 2023-10-08 ENCOUNTER — Ambulatory Visit: Payer: Self-pay | Admitting: Pharmacist

## 2023-10-08 ENCOUNTER — Ambulatory Visit: Payer: Self-pay

## 2023-10-08 NOTE — Telephone Encounter (Signed)
 Message routed to PCP Ngetich, Elijio [place], NP as FYI

## 2023-10-08 NOTE — Telephone Encounter (Signed)
 FYI Only or Action Required?: FYI only for provider.  Patient was last seen in primary care on 08/31/2023 by Ngetich, Roxan BROCKS, NP.  Called Nurse Triage reporting Facial Swelling.  Symptoms began today.  Interventions attempted: Nothing.  Symptoms are: worsening.  Triage Disposition: See HCP Within 4 Hours (Or PCP Triage)  Patient/caregiver understands and will follow disposition?: Yes      Copied from CRM #[id number]. Topic: Clinical - Red Word Triage >> Oct 08, 2023  2:32 PM Cherylann RAMAN wrote: Red Word that prompted transfer to Nurse Triage: Patient woke to the left side of her face being more swollen the right side of her face. She also indicates that up under her chin area is swollen.     Reason for Disposition  SEVERE face swelling (e.g., entire face)  Answer Assessment - Initial Assessment Questions Based on severity of swelling from patient's description I advised her to go to the ED for evaluation. Patient is agreeable with this plan.    1. ONSET: When did the swelling start? (e.g., minutes, hours, days)     Today  2. LOCATION: What part of the face is swollen? (e.g., cheek, entire face, jaw joint area, under jaw)     Entire face, left worse than right   3. SEVERITY: How swollen is it?     Moderate to severe I look like the elephant man 4. ITCHING: Is there any itching? If Yes, ask: How much?   (Scale 1-10; mild, moderate or severe)     Mild 5. PAIN: Is the swelling painful to touch? If Yes, ask: How painful is it?   (Scale 0-10; mild, moderate or severe)     Some pain 6. FEVER: Do you have a fever? If Yes, ask: What is it, how was it measured, and when did it start?      No 7. CAUSE: What do you think is causing the face swelling?     Unsure if due to new medication  8. NEW MEDICINES: Have there been any new medicines started recently?     Hydrochlorothiazide   9. RECURRENT SYMPTOM: Have you had face swelling before? If Yes, ask: When was  the last time? What happened that time?     No 10. OTHER SYMPTOMS: Do you have any other symptoms? (e.g., leg swelling, toothache)       No  Protocols used: Face Swelling-A-AH

## 2023-10-08 NOTE — Telephone Encounter (Signed)
 Karla Naegeli, RN to [facility]  (Selected Message)     10/08/23  2:42 PM FYI - Advised to go to the ED

## 2023-10-08 NOTE — Telephone Encounter (Signed)
 Noted. I called patient to notify her you are in agreeable to ED evaluation. Her phone went to voicemail and voicemail box is full. Im unable to leave message for patient.

## 2023-10-08 NOTE — Telephone Encounter (Signed)
 Agree with plan to send to ED.

## 2023-10-09 ENCOUNTER — Ambulatory Visit (HOSPITAL_COMMUNITY): Admitting: Family

## 2023-10-09 NOTE — Telephone Encounter (Signed)
 I have made an additional attempt to reach patient and voicemail box is full. Im unable to leave a voicemail for patient.

## 2023-10-11 ENCOUNTER — Telehealth: Payer: Self-pay | Admitting: Pharmacist

## 2023-10-11 NOTE — Telephone Encounter (Signed)
 Patient had to cancel appt tomorrow with PharmD because of a work conflict. She wanted to know if she could come in earlier in the day but I don't see anything available. I rescheduled her to 10/29 as this was the next available with Melissa. Patient was pretty upset about having to reschedule, I added her to the wait list in case of a cancellation. FYI

## 2023-10-11 NOTE — Telephone Encounter (Unsigned)
 Rescheduled for 7:30 AM tomorrow Reports taking BP meds on and off due to swelling on her cheek Denies and SOB, swelling of lips or tongue  Started Sunday Started hydrochlorothiazide  on Friday  We got disconnected and went straight to VM when I tried to call back x 2

## 2023-10-12 ENCOUNTER — Ambulatory Visit: Attending: Cardiovascular Disease | Admitting: Pharmacist

## 2023-10-12 ENCOUNTER — Ambulatory Visit: Admitting: Pharmacist

## 2023-10-12 ENCOUNTER — Other Ambulatory Visit (HOSPITAL_COMMUNITY): Payer: Self-pay

## 2023-10-12 VITALS — BP 180/110 | HR 86

## 2023-10-12 DIAGNOSIS — I10 Essential (primary) hypertension: Secondary | ICD-10-CM | POA: Diagnosis not present

## 2023-10-12 MED ORDER — HYDRALAZINE HCL 100 MG PO TABS
100.0000 mg | ORAL_TABLET | Freq: Two times a day (BID) | ORAL | 3 refills | Status: DC
  Filled 2023-10-12: qty 60, 30d supply, fill #0
  Filled 2023-10-19: qty 60, 30d supply, fill #1

## 2023-10-12 NOTE — Progress Notes (Signed)
 Patient ID: Deanna Powell                 DOB: 05-30-1980                      MRN: [id number]      HPI: Deanna Powell is a 43 y.o. female referred by Dr. Ladona to HTN clinic. PMH is significant for bipolar disorder, hypertension, reactive airway disease.  Previously financial constraints contributed to noncompliance.   Patient last saw HTN clinic 09/06/23. At this visit, patient's BP was 180/122. Patient stated that she was going through a manic episode and was under a lot of stress due to personal matters going on. It was unclear if she had been taking her BP medication as prescribed. Patient later admitted she did not have any of her blood pressure medications and was not taking.  A new rx was sent for amlodipine -valsartan  and hydralazine  for patient to pick up. Informed her to contact insurance about a BP cuff.  Patient was last seen 9//25.  Blood pressure in clinic 170/120.  Had not been able to get a blood pressure cuff yet.  She had resumed her blood pressure medications about a week prior.  Was working on trying to decrease her sodium intake.  Patient was sent for baseline labs and hydrochlorothiazide  25 mg daily was started.  However BMP was never drawn.  I last saw patient 10/08/23.  She had not started the hydrochlorothiazide .  Blood pressure was 180/120 in clinic.  No home blood pressures were available as she still had not been able to get a blood pressure cuff.  She was given a cuff from the patient care fund she could not afford 1 and her insurance did not cover.  She was asked to start hydrochlorothiazide .  Patient presents today for follow-up.  Reports at home her blood pressure has been about 150/105.  She denies any dizziness or lightheadedness, headaches.  She reports that 2 days after starting the hydrochlorothiazide  her cheeks were swollen.  She denies any new skin care products or soaps.  She has taken hydrochlorothiazide  intermittently since then.  She did take 1 yesterday evening but  had not taken it on Wednesday or Thursday.  Reports compliance with her other blood pressure medications.  Has been experiencing some blurred vision which may be due to her excessively high blood pressure. She missed her appointment with behavioral health because she got confused about the day.  She states she called to reschedule but is waiting for them to call her back.  Reports doing dance exercise videos at home 3 times a week.  Has not had any [facility] this week, trying to cut back.  Reports taking her hydralazine  at 10 AM, 5 PM and 12 AM.  Has not taken morning medications yet.  Generally takes them around 10 AM and it is now about 8 AM.   Current HTN meds: Amlodipine /valsartan  10/320 daily, hydralazine  50 mg 3 times a day, hydrochlorothiazide  25mg  daily (not taking) Previously tried: Lisinopril , labetalol , spironolactone -hydrochlorothiazide  (stopped due to cost) BP goal: <130/80  Family History:  Family History  Problem Relation Age of Onset   Diabetes Father    Cirrhosis Father    Cancer Cousin 5       breast cancer   Cancer Cousin 61       breast cancer paternal cousin     Social History:  Social History   Socioeconomic History   Marital status: Single  Spouse name: 2   Number of children: Not on file   Years of education: Not on file   Highest education level: Not on file  Occupational History   Not on file  Tobacco Use   Smoking status: Never    Passive exposure: Never   Smokeless tobacco: Never  Vaping Use   Vaping status: Never Used  Substance and Sexual Activity   Alcohol use: Not Currently    Comment: occasionally   Drug use: No   Sexual activity: Not Currently    Birth control/protection: Surgical  Other Topics Concern   Not on file  Social History Narrative   Not on file   Social Drivers of Health   Financial Resource Strain: High Risk (09/10/2023)   Overall Financial Resource Strain (CARDIA)    Difficulty of Paying Living Expenses: Hard   Food Insecurity: Food Insecurity Present (09/10/2023)   Hunger Vital Sign    Worried About Running Out of Food in the Last Year: Sometimes true    Ran Out of Food in the Last Year: Sometimes true  Transportation Needs: No Transportation Needs (09/10/2023)   PRAPARE - Administrator, Civil Service (Medical): No    Lack of Transportation (Non-Medical): No  Physical Activity: Not on file  Stress: Not on file (01/05/2023)  Social Connections: Not on file  Intimate Partner Violence: Not on file    Diet:  Mountain dew- typically 4 bottles a day, states that she is trying to wean off of them Breakfast: boiled eggs, bacon (4) and cheese toast Lunch: microwave dinners Dinner: take out- chinese food/mexican  Exercise:  Walks about a mile when able, typically takes 30 minutes Youtube workout videos- 3 times a week  Wt Readings from Last 3 Encounters:  08/31/23 222 lb (100.7 kg)  06/06/23 218 lb 6.4 oz (99.1 kg)  05/18/23 217 lb 6.4 oz (98.6 kg)   BP Readings from Last 3 Encounters:  10/12/23 (!) 180/110  10/05/23 (!) 180/120  09/20/23 (!) 170/120   Pulse Readings from Last 3 Encounters:  10/12/23 86  09/20/23 80  09/06/23 91    Renal function: CrCl cannot be calculated (Unknown ideal weight.).  Past Medical History:  Diagnosis Date   Anxiety    Bipolar 1 disorder (HCC)    PTSD (post-traumatic stress disorder)     Current Outpatient Medications on File Prior to Visit  Medication Sig Dispense Refill   doxepin  (SINEQUAN ) 50 MG capsule Take 1 capsule (50 mg total) by mouth at bedtime. 30 capsule 2   lamoTRIgine  (LAMICTAL ) 25 MG tablet Take one tablet by mouth daily for one week and then 2 tab daily 60 tablet 0   albuterol  (VENTOLIN  HFA) 108 (90 Base) MCG/ACT inhaler INHALE 1-2 PUFFS BY MOUTH EVERY 6 HOURS AS NEEDED FOR WHEEZE OR SHORTNESS OF BREATH 18 each 3   amLODipine -valsartan  (EXFORGE ) 10-320 MG tablet Take 1 tablet by mouth daily. 90 tablet 3   Blood Pressure  Monitoring (OMRON 3 SERIES BP MONITOR) DEVI Use to measure blood pressure as directed. 1 each 0   fluticasone  (FLONASE ) 50 MCG/ACT nasal spray Place 1 spray into both nostrils daily. 16 g 6   fluticasone -salmeterol (ADVAIR) 100-50 MCG/ACT AEPB Inhale 1 puff into the lungs 2 (two) times daily. 1 each 3   hydrochlorothiazide  (HYDRODIURIL ) 25 MG tablet Take 1 tablet (25 mg total) by mouth daily. 90 tablet 3   hydrOXYzine  (VISTARIL ) 25 MG capsule TAKE ONE CAPSULE BY MOUTH IN THE MORNING AND TWO  AT BED TIME 270 capsule 1   montelukast  (SINGULAIR ) 10 MG tablet Take 1 tablet (10 mg total) by mouth at bedtime. 90 tablet 1   Multiple Vitamin (MULTIVITAMIN) capsule Take 1 capsule by mouth daily. 90 capsule 3   senna-docusate (SENOKOT-S) 8.6-50 MG tablet Take 1 tablet by mouth at bedtime. 30 tablet 3   No current facility-administered medications on file prior to visit.    No Known Allergies  Blood pressure (!) 180/110, pulse 86.   Assessment/Plan: HYPERTENSION CONTROL Vitals:   10/12/23 0753 10/12/23 0754  BP: (!) 180/110 (!) 180/110    The patient's blood pressure is elevated above target today.  In order to address the patient's elevated BP:       1. Hypertension -  Essential hypertension Assessment: Although her blood pressures are still elevated they are improved Clinic blood pressure still remains significantly elevated and no change from last visit However patient was seen about 1 week ago and has only intermittently taking hydrochlorothiazide  due to thoughts that it might be causing her cheeks to swell Denies any shortness of breath lip or tongue swelling Has increased her exercise and I congratulated her on this Also working on cutting back on caffeine Still not sleeping very well-reminded her several times to make sure that she reschedules her behavioral health visit Patient comfortable continuing hydrochlorothiazide    Plan: Increase hydralazine  to 100 mg 3 times a  day Continue amlodipine /valsartan  10/20 daily and hydrochlorothiazide  25 mg daily Follow-up in about 1 week Patient provided with a blood pressure log and asked to check her blood pressure at home recorded on the log and bring it to her next reading      Thank you  Alaura Schippers D Yuji Walth, Pharm.JONETTA SARAN, CPP [facility] Division of [facility] [address], [place], [place] [address]  Phone: [phone]; Fax: [phone]

## 2023-10-12 NOTE — Assessment & Plan Note (Addendum)
 Assessment: Although her blood pressures are still elevated they are improved Clinic blood pressure still remains significantly elevated and no change from last visit However patient was seen about 1 week ago and has only intermittently taking hydrochlorothiazide  due to thoughts that it might be causing her cheeks to swell Denies any shortness of breath lip or tongue swelling Has increased her exercise and I congratulated her on this Also working on cutting back on caffeine Still not sleeping very well-reminded her several times to make sure that she reschedules her behavioral health visit Patient comfortable continuing hydrochlorothiazide    Plan: Increase hydralazine  to 100 mg 3 times a day Continue amlodipine /valsartan  10/20 daily and hydrochlorothiazide  25 mg daily Follow-up in about 1 week Patient provided with a blood pressure log and asked to check her blood pressure at home recorded on the log and bring it to her next reading

## 2023-10-12 NOTE — Patient Instructions (Addendum)
 Your blood pressure goal is < 130/37mmHg   Increase hydralazine  to 100mg  three times a day Continue taking amlodipine /valsartan  10/320 daily, hydrochlorothiazide  25mg  daily Please check blood pressure at home and write down the readings. Please bring these with you to your next appointment Good job staying off the [place]!  Important lifestyle changes to control high blood pressure  Intervention  Effect on the BP   Weight loss Weight loss is one of the most effective lifestyle changes for controlling blood pressure. If you're overweight or obese, losing even a small amount of weight can help reduce blood pressure.    Blood pressure can decrease by 1 millimeter of mercury (mmHg) with each kilogram (about 2.2 pounds) of weight lost.   Exercise regularly As a general goal, aim for 30 minutes of moderate physical activity every day.    Regular physical activity can lower blood pressure by 5 - 8 mmHg.   Eat a healthy diet Eat a diet rich in whole grains, fruits, vegetables, lean meat, and low-fat dairy products. Limit processed foods, saturated fat, and sweets.    A heart-healthy diet can lower high blood pressure by 10 mmHg.   Reduce salt (sodium) in your diet Aim for 000mg  of sodium each day. Avoid deli meats, canned food, and frozen microwave meals which are high in sodium.     Limiting sodium can reduce blood pressure by 5 mmHg.   Limit alcohol One drink equals 12 ounces of beer, 5 ounces of wine, or 1.5 ounces of 80-proof liquor.    Limiting alcohol to < 1 drink a day for women or < 2 drinks a day for men can help lower blood pressure by about 4 mmHg.   To check your pressure at home you will need to:   Sit up in a chair, with feet flat on the floor and back supported. Do not cross your ankles or legs. Rest your left arm so that the cuff is about heart level. If the cuff goes on your upper arm, then just relax your arm on the table, arm of the chair, or your lap. If you  have a wrist cuff, hold your wrist against your chest at heart level. Place the cuff snugly around your arm, about 1 inch above the crease of your elbow. The cords should be inside the groove of your elbow.  Sit quietly, with the cuff in place, for about 5 minutes. Then press the power button to start a reading. Do not talk or move while the reading is taking place.  Record your readings on a sheet of paper. Although most cuffs have a memory, it is often easier to see a pattern developing when the numbers are all in front of you.  You can repeat the reading after 1-3 minutes if it is recommended.   Make sure your bladder is empty and you have not had caffeine or tobacco within the last 30 minutes   Always bring your blood pressure log with you to your appointments. If you have not brought your monitor in to be double checked for accuracy, please bring it to your next appointment.   You can find a list of validated (accurate) blood pressure cuffs at: validatebp.org

## 2023-10-17 NOTE — Progress Notes (Signed)
 Deanna Powell                                          MRN: [id number]   10/17/2023   The [facility] Quality Team Specialist reviewed this patient medical record for the purposes of chart review for care gap closure. The following were reviewed: chart review for care gap closure-controlling blood pressure.    [facility] Quality Team

## 2023-10-20 ENCOUNTER — Emergency Department (HOSPITAL_COMMUNITY)

## 2023-10-20 ENCOUNTER — Emergency Department (HOSPITAL_COMMUNITY)
Admission: EM | Admit: 2023-10-20 | Discharge: 2023-10-20 | Disposition: A | Discharge status: Home / Self Care | Attending: Emergency Medicine | Admitting: Emergency Medicine

## 2023-10-20 ENCOUNTER — Other Ambulatory Visit: Payer: Self-pay

## 2023-10-20 ENCOUNTER — Encounter (HOSPITAL_COMMUNITY): Payer: Self-pay | Admitting: Emergency Medicine

## 2023-10-20 DIAGNOSIS — R519 Headache, unspecified: Secondary | ICD-10-CM | POA: Diagnosis not present

## 2023-10-20 DIAGNOSIS — J329 Chronic sinusitis, unspecified: Secondary | ICD-10-CM | POA: Diagnosis not present

## 2023-10-20 DIAGNOSIS — R202 Paresthesia of skin: Secondary | ICD-10-CM | POA: Insufficient documentation

## 2023-10-20 DIAGNOSIS — Z79899 Other long term (current) drug therapy: Secondary | ICD-10-CM | POA: Insufficient documentation

## 2023-10-20 DIAGNOSIS — G4486 Cervicogenic headache: Secondary | ICD-10-CM | POA: Diagnosis not present

## 2023-10-20 DIAGNOSIS — R200 Anesthesia of skin: Secondary | ICD-10-CM | POA: Diagnosis not present

## 2023-10-20 DIAGNOSIS — I10 Essential (primary) hypertension: Secondary | ICD-10-CM | POA: Diagnosis not present

## 2023-10-20 DIAGNOSIS — Z8673 Personal history of transient ischemic attack (TIA), and cerebral infarction without residual deficits: Secondary | ICD-10-CM | POA: Diagnosis not present

## 2023-10-20 DIAGNOSIS — R29818 Other symptoms and signs involving the nervous system: Secondary | ICD-10-CM | POA: Diagnosis not present

## 2023-10-20 LAB — CBC
HCT: 37.9 % (ref 36.0–46.0)
Hemoglobin: 11.2 g/dL — ABNORMAL LOW (ref 12.0–15.0)
MCH: 21.4 pg — ABNORMAL LOW (ref 26.0–34.0)
MCHC: 29.6 g/dL — ABNORMAL LOW (ref 30.0–36.0)
MCV: 72.5 fL — ABNORMAL LOW (ref 80.0–100.0)
Platelets: 343 K/uL (ref 150–400)
RBC: 5.23 MIL/uL — ABNORMAL HIGH (ref 3.87–5.11)
RDW: 17 % — ABNORMAL HIGH (ref 11.5–15.5)
WBC: 9.9 K/uL (ref 4.0–10.5)
nRBC: 0 % (ref 0.0–0.2)

## 2023-10-20 LAB — DIFFERENTIAL
Abs Immature Granulocytes: 0.03 K/uL (ref 0.00–0.07)
Basophils Absolute: 0.1 K/uL (ref 0.0–0.1)
Basophils Relative: 1 %
Eosinophils Absolute: 0.1 K/uL (ref 0.0–0.5)
Eosinophils Relative: 1 %
Immature Granulocytes: 0 %
Lymphocytes Relative: 21 %
Lymphs Abs: 2 K/uL (ref 0.7–4.0)
Monocytes Absolute: 0.5 K/uL (ref 0.1–1.0)
Monocytes Relative: 5 %
Neutro Abs: 7.3 K/uL (ref 1.7–7.7)
Neutrophils Relative %: 72 %

## 2023-10-20 LAB — I-STAT CHEM 8, ED
BUN: 5 mg/dL — ABNORMAL LOW (ref 6–20)
Calcium, Ion: 1.07 mmol/L — ABNORMAL LOW (ref 1.15–1.40)
Chloride: 105 mmol/L (ref 98–111)
Creatinine, Ser: 0.9 mg/dL (ref 0.44–1.00)
Glucose, Bld: 103 mg/dL — ABNORMAL HIGH (ref 70–99)
HCT: 38 % (ref 36.0–46.0)
Hemoglobin: 12.9 g/dL (ref 12.0–15.0)
Potassium: 3.6 mmol/L (ref 3.5–5.1)
Sodium: 139 mmol/L (ref 135–145)
TCO2: 21 mmol/L — ABNORMAL LOW (ref 22–32)

## 2023-10-20 LAB — COMPREHENSIVE METABOLIC PANEL WITH GFR
ALT: 17 U/L (ref 0–44)
AST: 24 U/L (ref 15–41)
Albumin: 3.7 g/dL (ref 3.5–5.0)
Alkaline Phosphatase: 58 U/L (ref 38–126)
Anion gap: 12 (ref 5–15)
BUN: 5 mg/dL — ABNORMAL LOW (ref 6–20)
CO2: 21 mmol/L — ABNORMAL LOW (ref 22–32)
Calcium: 8.7 mg/dL — ABNORMAL LOW (ref 8.9–10.3)
Chloride: 104 mmol/L (ref 98–111)
Creatinine, Ser: 0.99 mg/dL (ref 0.44–1.00)
GFR, Estimated: >60 mL/min (ref >60)
Glucose, Bld: 104 mg/dL — ABNORMAL HIGH (ref 70–99)
Potassium: 3.6 mmol/L (ref 3.5–5.1)
Sodium: 137 mmol/L (ref 135–145)
Total Bilirubin: 0.4 mg/dL (ref 0.0–1.2)
Total Protein: 7 g/dL (ref 6.5–8.1)

## 2023-10-20 LAB — APTT: aPTT: 27 s (ref 24–36)

## 2023-10-20 LAB — RAPID URINE DRUG SCREEN, HOSP PERFORMED
Amphetamines: NOT DETECTED
Barbiturates: NOT DETECTED
Benzodiazepines: NOT DETECTED
Cocaine: NOT DETECTED
Opiates: NOT DETECTED
Tetrahydrocannabinol: NOT DETECTED

## 2023-10-20 LAB — PROTIME-INR
INR: 1.1 (ref 0.8–1.2)
Prothrombin Time: 14.3 s (ref 11.4–15.2)

## 2023-10-20 LAB — ETHANOL: Alcohol, Ethyl (B): <15 mg/dL (ref <15)

## 2023-10-20 MED ORDER — ACETAMINOPHEN 500 MG PO TABS
ORAL_TABLET | ORAL | Status: AC
  Filled 2023-10-20: qty 2

## 2023-10-20 MED ORDER — ACETAMINOPHEN 500 MG PO TABS
1000.0000 mg | ORAL_TABLET | Freq: Once | ORAL | Status: AC
  Administered 2023-10-20: 1000 mg via ORAL
  Filled 2023-10-20: qty 2

## 2023-10-20 NOTE — Discharge Instructions (Addendum)
 You have been evaluated for your symptoms.  Fortunately MRI of the brain did not show any acute changes.  Your headache and numbness may be signs of complicated migraine.  Please follow-up closely with neurology outpatient for evaluation.  Return to ER if your condition worsen or if you have other concern.

## 2023-10-20 NOTE — ED Notes (Signed)
 Pt to be evaluated in triage by Rob, PA per phone conversation

## 2023-10-20 NOTE — ED Notes (Signed)
 This RN reviewed discharge instructions with patient. She verbalized understanding and denied any further questions. PT well appearing upon discharge and reports no pain. Pt ambulated with stable gait to exit. Pt endorses ride home.

## 2023-10-20 NOTE — ED Provider Notes (Signed)
 [place] EMERGENCY DEPARTMENT AT [facility] Provider Note   CSN: [id number] Arrival date & time: 10/20/23  9876     Patient presents with: Hypertension and Numbness   Deanna Powell is a 43 y.o. female.   The history is provided by the patient and medical records. No language interpreter was used.  Hypertension     43 year old female history of essential hypertension, bipolar, PTSD, anxiety, presenting with complaint of numbness.  Patient states around 3 PM yesterday afternoon while at work she developed headache.  She describes headaches as a throbbing sensation throughout her head and with some blurred vision and she also experiencing tingling sensation to the left side of her arm.  Symptoms mild persist throughout the shift and when she came home around midnight she checked her blood pressure and noticed that it was elevated in the 180s systolic prompting this ER visit.  During this visit she still endorsed having a throbbing headache with tingling sensation to her left arm and now some tingling sensation to her left feet as well.  Blurry vision seems to be improving.  She did endorse 1 bouts of loose stools and some abdominal cramping earlier in the day.  She does not complain of double vision, confusion, neck stiffness, chest pain, trouble breathing, productive cough or urinary symptoms.  She does have history of high blood pressure and does take her blood pressure medication.  No prior history of stroke.  No history of complicated migraine.  She denies any light or sound sensitivity.  Prior to Admission medications   Medication Sig Start Date End Date Taking? Authorizing Provider  albuterol  (VENTOLIN  HFA) 108 (90 Base) MCG/ACT inhaler INHALE 1-2 PUFFS BY MOUTH EVERY 6 HOURS AS NEEDED FOR WHEEZE OR SHORTNESS OF BREATH 08/31/23   Ngetich, Dinah C, NP  amLODipine -valsartan  (EXFORGE ) 10-320 MG tablet Take 1 tablet by mouth daily. 09/10/23   Ladona Heinz, MD  Blood Pressure  Monitoring (OMRON 3 SERIES BP MONITOR) DEVI Use to measure blood pressure as directed. 09/20/23   Maccia, Melissa D, RPH-CPP  doxepin  (SINEQUAN ) 50 MG capsule Take 1 capsule (50 mg total) by mouth at bedtime. 04/20/23 04/19/24  Ezzard Staci SAILOR, NP  fluticasone  (FLONASE ) 50 MCG/ACT nasal spray Place 1 spray into both nostrils daily. 08/31/23   Ngetich, Dinah C, NP  fluticasone -salmeterol (ADVAIR) 100-50 MCG/ACT AEPB Inhale 1 puff into the lungs 2 (two) times daily. 08/31/23   Ngetich, Dinah C, NP  hydrALAZINE  (APRESOLINE ) 100 MG tablet Take 1 tablet (100 mg total) by mouth 2 (two) times daily. 10/12/23   Ladona Heinz, MD  hydrochlorothiazide  (HYDRODIURIL ) 25 MG tablet Take 1 tablet (25 mg total) by mouth daily. 09/20/23 12/19/23  Ladona Heinz, MD  hydrOXYzine  (VISTARIL ) 25 MG capsule TAKE ONE CAPSULE BY MOUTH IN THE MORNING AND TWO AT BED TIME 02/23/23   Ngetich, Dinah C, NP  lamoTRIgine  (LAMICTAL ) 25 MG tablet Take one tablet by mouth daily for one week and then 2 tab daily 05/01/23   Ngetich, Dinah C, NP  montelukast  (SINGULAIR ) 10 MG tablet Take 1 tablet (10 mg total) by mouth at bedtime. 08/31/23   Ngetich, Dinah C, NP  Multiple Vitamin (MULTIVITAMIN) capsule Take 1 capsule by mouth daily. 05/18/22   Ngetich, Dinah C, NP  senna-docusate (SENOKOT-S) 8.6-50 MG tablet Take 1 tablet by mouth at bedtime. 08/31/23   Ngetich, Dinah C, NP    Allergies: Patient has no known allergies.    Review of Systems  All other systems reviewed and  are negative.   Updated Vital Signs BP (!) 136/109 (BP Location: Right Arm)   Pulse 95   Temp 99.9 F (37.7 C) (Oral)   Resp 17   Wt 100.7 kg   SpO2 100%   BMI 36.94 kg/m   Physical Exam Vitals and nursing note reviewed.  Constitutional:      General: She is not in acute distress.    Appearance: She is well-developed. She is obese.  HENT:     Head: Atraumatic.  Eyes:     Extraocular Movements: Extraocular movements intact.     Conjunctiva/sclera: Conjunctivae normal.      Pupils: Pupils are equal, round, and reactive to light.  Cardiovascular:     Rate and Rhythm: Normal rate and regular rhythm.     Pulses: Normal pulses.     Heart sounds: Normal heart sounds.  Pulmonary:     Effort: Pulmonary effort is normal.     Breath sounds: No wheezing, rhonchi or rales.  Abdominal:     Palpations: Abdomen is soft.     Tenderness: There is no abdominal tenderness.  Musculoskeletal:     Cervical back: Neck supple.     Comments: 5 out of 5 strength to all 4 extremities with equal effort  Skin:    Findings: No rash.  Neurological:     Mental Status: She is alert.     Comments: Neurologic exam:  Speech clear, pupils equal round reactive to light, extraocular movements intact  Normal peripheral visual fields Cranial nerves III through XII normal including no facial droop Follows commands, moves all extremities x4, normal strength to bilateral upper and lower extremities at all major muscle groups including grip Sensation slightly diminished in to left side of face and left arm compared to the right. Coordination intact, no limb ataxia No pronator drift Gait not tested   Psychiatric:        Mood and Affect: Mood normal.     (all labs ordered are listed, but only abnormal results are displayed) Labs Reviewed  CBC - Abnormal; Notable for the following components:      Result Value   RBC 5.23 (*)    Hemoglobin 11.2 (*)    MCV 72.5 (*)    MCH 21.4 (*)    MCHC 29.6 (*)    RDW 17.0 (*)    All other components within normal limits  COMPREHENSIVE METABOLIC PANEL WITH GFR - Abnormal; Notable for the following components:   CO2 21 (*)    Glucose, Bld 104 (*)    BUN 5 (*)    Calcium 8.7 (*)    All other components within normal limits  I-STAT CHEM 8, ED - Abnormal; Notable for the following components:   BUN 5 (*)    Glucose, Bld 103 (*)    Calcium, Ion 1.07 (*)    TCO2 21 (*)    All other components within normal limits  ETHANOL  PROTIME-INR  APTT   DIFFERENTIAL  RAPID URINE DRUG SCREEN, HOSP PERFORMED    EKG: EKG Interpretation Date/Time:  Saturday October 20 2023 01:54:20 EDT Ventricular Rate:  95 PR Interval:  140 QRS Duration:  94 QT Interval:  368 QTC Calculation: 462 R Axis:   53  Text Interpretation: Normal sinus rhythm ST & T wave abnormality, consider inferior ischemia Prolonged QT Abnormal ECG When compared with ECG of 13-Mar-2023 11:22, PREVIOUS ECG IS PRESENT Confirmed by Haze Lonni PARAS [phone]) on 10/20/2023 4:38:19 AM  Radiology: MR BRAIN WO CONTRAST  Result Date: 10/20/2023 CLINICAL DATA:  Provided history: Neuro deficit, acute, stroke suspected. EXAM: MRI HEAD WITHOUT CONTRAST TECHNIQUE: Multiplanar, multiecho pulse sequences of the brain and surrounding structures were obtained without intravenous contrast. COMPARISON:  Head CT 10/20/2023. FINDINGS: Mild intermittent motion degradation. Within this limitation, findings are as follows. Brain: Cerebral volume is normal. Partially empty sella turcica. No cortical encephalomalacia is identified. No significant cerebral white matter disease. There is no acute infarct. No evidence of an intracranial mass. No chronic intracranial blood products. No extra-axial fluid collection. No midline shift. Vascular: Maintained flow voids within the proximal large arterial vessels. Skull and upper cervical spine: No focal worrisome marrow lesion. Sinuses/Orbits: No mass or acute finding within the imaged orbits. Mild mucosal thickening, and small mucous retention cysts, within the maxillary sinuses. Minimal mucosal thickening within the right sphenoid sinus. IMPRESSION: 1. Mildly motion degraded exam. 2. No evidence of an acute intracranial abnormality. 3. Partially empty sella turcica. This finding can reflect incidental anatomic variation, or alternatively, it can be associated with chronic idiopathic intracranial hypertension (pseudotumor cerebri). 4. Paranasal sinus disease as  described. Electronically Signed   By: Rockey Childs D.O.   On: 10/20/2023 12:29   CT HEAD WO CONTRAST Result Date: 10/20/2023 EXAM: CT HEAD WITHOUT CONTRAST 10/20/2023 02:43:00 AM TECHNIQUE: CT of the head was performed without the administration of intravenous contrast. Automated exposure control, iterative reconstruction, and/or weight based adjustment of the mA/kV was utilized to reduce the radiation dose to as low as reasonably achievable. COMPARISON: 04/26/21 CLINICAL HISTORY: Neuro deficit, acute, stroke suspected. Chief complaints: Hypertension, Numbness. FINDINGS: BRAIN AND VENTRICLES: No acute hemorrhage or evidence of acute infarct. No hydrocephalus or extra-axial collection. No mass effect or midline shift. ORBITS: No acute abnormality. SINUSES: No acute abnormality. SOFT TISSUES AND SKULL: No acute soft tissue abnormality or skull fracture. IMPRESSION: 1. No acute intracranial abnormality Electronically signed by: Franky Stanford MD 10/20/2023 03:08 AM EDT RP Workstation: [id number]     Procedures   Medications Ordered in the ED  acetaminophen  (TYLENOL ) tablet 1,000 mg ( Oral Canceled Entry 10/20/23 0851)                                    Medical Decision Making Amount and/or Complexity of Data Reviewed Radiology: ordered.  Risk OTC drugs.   BP (!) 136/109 (BP Location: Right Arm)   Pulse 95   Temp 99.9 F (37.7 C) (Oral)   Resp 17   Wt 100.7 kg   SpO2 100%   BMI 36.94 kg/m   68:5 AM 43 year old female history of essential hypertension, bipolar, PTSD, anxiety, presenting with complaint of numbness.  Patient states around 3 PM yesterday afternoon while at work she developed headache.  She describes headaches as a throbbing sensation throughout her head and with some blurred vision and she also experiencing tingling sensation to the left side of her arm.  Symptoms mild persist throughout the shift and when she came home around midnight she checked her blood pressure and  noticed that it was elevated in the 180s systolic prompting this ER visit.  During this visit she still endorsed having a throbbing headache with tingling sensation to her left arm and now some tingling sensation to her left feet as well.  Blurry vision seems to be improving.  She did endorse 1 bouts of loose stools and some abdominal cramping earlier in the day.  She does not complain  of double vision, confusion, neck stiffness, chest pain, trouble breathing, productive cough or urinary symptoms.  She does have history of high blood pressure and does take her blood pressure medication.  No prior history of stroke.  No history of complicated migraine.  She denies any light or sound sensitivity.  On exam patient does not have any objective deficit except for subjective decrease sensation to left side of face and left arm however with equal strength.  No facial droop.  -Labs ordered, independently viewed and interpreted by me.  Labs remarkable for labs overall reassuring -The patient was maintained on a cardiac monitor.  I personally viewed and interpreted the cardiac monitored which showed an underlying rhythm of: Normal sinus rhythm -Imaging independently viewed and interpreted by me and I agree with radiologist's interpretation.  Result remarkable for initial head CT showing no acute abnormality.  Brain MRI read currently pending. -This patient presents to the ED for concern of numbness, this involves an extensive number of treatment options, and is a complaint that carries with it a high risk of complications and morbidity.  The differential diagnosis includes complicated migraine, subarachnoid hemorrhage -Co morbidities that complicate the patient evaluation includes HTN, bipolar, PTSD -Treatment includes tylenol  -Reevaluation of the patient after these medicines showed that the patient improved -PCP office notes or outside notes reviewed -Escalation to admission/observation considered: patients feels  much better, is comfortable with discharge, and will follow up with PCP -Prescription medication considered, patient comfortable with home medication -Social Determinant of Health considered which includes food insecurity, housing insecurity, financial hardship      Final diagnoses:  Paresthesia  Cervicogenic headache    ED Discharge Orders     None          Nivia Colon, PA-C 10/20/23 1507    Levander Houston, MD 10/23/23 1113

## 2023-10-20 NOTE — ED Notes (Signed)
 Provided patient with sandwhich and drink.

## 2023-10-20 NOTE — ED Notes (Signed)
 CCMD called.

## 2023-10-20 NOTE — ED Provider Triage Note (Signed)
 Emergency Medicine Provider Triage Evaluation Note  Deanna Powell , a 43 y.o. female  was evaluated in triage.  Pt complains of left arm numbness, headache, high BP and blurry vision.  Onset was this afternoon at 3pm.  She states that the symptoms have been persistent since then, except that the blurry vision has resolved.  She still has numbness sensation in the RUE.  She says she feels off, but denies weakness/strength deficit.  Review of Systems  Positive: Numbness, headache, blurry vision Negative: weakness  Physical Exam  Wt 100.7 kg   BMI 36.94 kg/m  Gen:   Awake, no distress   Resp:  Normal effort  MSK:   Moves extremities without difficulty  Other:  CN 3-12 intact, normal strength in extremities, decreased sensation in left upper extremity.  Medical Decision Making  Medically screening exam initiated at 1:41 AM.  Appropriate orders placed.  Ioma Chismar was informed that the remainder of the evaluation will be completed by another provider, this initial triage assessment does not replace that evaluation, and the importance of remaining in the ED until their evaluation is complete.  LKW 3pm yesterday, so ~11 hours ago, not made code stroke.   Vicky Charleston, PA-C 10/20/23 9854

## 2023-10-20 NOTE — ED Triage Notes (Signed)
 Pt in ambulatory with reported HTN and L arm numbness that began at 2300 while at work tonight. Pt also reported HA, took all bp meds yesterday. No unilateral weakness noted, speech clear

## 2023-10-22 ENCOUNTER — Ambulatory Visit: Attending: Cardiology | Admitting: Pharmacist

## 2023-10-22 ENCOUNTER — Other Ambulatory Visit (HOSPITAL_COMMUNITY): Payer: Self-pay

## 2023-10-22 DIAGNOSIS — I10 Essential (primary) hypertension: Secondary | ICD-10-CM

## 2023-10-22 MED ORDER — AMLODIPINE BESYLATE-VALSARTAN 10-320 MG PO TABS: 1.0000 | ORAL_TABLET | Freq: Every day | ORAL | 3 refills | Status: DC

## 2023-10-22 MED ORDER — LABETALOL HCL 100 MG PO TABS: 100.0000 mg | ORAL_TABLET | Freq: Two times a day (BID) | ORAL | 0 refills | Status: DC

## 2023-10-22 MED ORDER — HYDRALAZINE HCL 50 MG PO TABS
50.0000 mg | ORAL_TABLET | Freq: Three times a day (TID) | ORAL | 3 refills | Status: DC
  Filled 2023-10-22: qty 90, 30d supply, fill #0

## 2023-10-22 MED ORDER — HYDRALAZINE HCL 50 MG PO TABS: 50.0000 mg | ORAL_TABLET | Freq: Three times a day (TID) | ORAL | 3 refills | Status: AC

## 2023-10-22 MED ORDER — LABETALOL HCL 100 MG PO TABS
100.0000 mg | ORAL_TABLET | Freq: Two times a day (BID) | ORAL | 0 refills | Status: DC
  Filled 2023-10-22: qty 60, 30d supply, fill #0

## 2023-10-22 MED ORDER — HYDROCHLOROTHIAZIDE 25 MG PO TABS: 25.0000 mg | ORAL_TABLET | Freq: Every day | ORAL | 3 refills | Status: DC

## 2023-10-22 NOTE — Progress Notes (Signed)
 Patient ID: Deanna Powell                 DOB: 09-13-1980                      MRN: [id number]      HPI: Deanna Powell is a 43 y.o. female referred by Dr. Ladona to HTN clinic. PMH is significant for bipolar disorder, hypertension, reactive airway disease.  Previously financial constraints contributed to noncompliance.   Patient last saw HTN clinic 09/06/23. At this visit, patient's BP was 180/122. Patient stated that she was going through a manic episode and was under a lot of stress due to personal matters going on. It was unclear if she had been taking her BP medication as prescribed. Patient later admitted she did not have any of her blood pressure medications and was not taking.  A new rx was sent for amlodipine -valsartan  and hydralazine  for patient to pick up. Informed her to contact insurance about a BP cuff.  Patient seen 9//25.  Blood pressure in clinic 170/120.  Had not been able to get a blood pressure cuff yet.  She had resumed her blood pressure medications about a week prior.  Was working on trying to decrease her sodium intake.  Patient was sent for baseline labs and hydrochlorothiazide  25 mg daily was started.  However BMP was never drawn.  I last saw patient 10/08/23.  She had not started the hydrochlorothiazide .  Blood pressure was 180/120 in clinic.  No home blood pressures were available as she still had not been able to get a blood pressure cuff.  She was given a cuff from the patient care fund she could not afford 1 and her insurance did not cover.  She was asked to start hydrochlorothiazide .  Last seen in clinic 10/12/2023.  Blood pressure at that visit was 180/110 but had not been taking her hydrochlorothiazide  consistently due to concerns it may be causing her cheeks to swell.  She was resumed on HCTZ and hydralazine  was increased to 100 mg 3 times daily.  Patient presents today for follow-up.  She reports that she was seen in the ER 2 days ago for dizziness/numbness/blurry vision.   Her blood pressure at home was 182/113 with a heart rate of 94.  Blood pressure in the ER was 136/109.  MRI lab work unremarkable.  She reports headache ever since increasing the hydralazine  dose.  Still has headache but dizziness and her vision has improved.  Blood pressure in clinic today 168/100.  Patient reports home blood pressures in the 150s/ 110.  Heart rates in the 90s over 100.  Reports she continues to avoid [facility] and other caffeinated beverages.  She has yet to reschedule her behavioral health appointment.  We were able to reschedule while in the clinic today.  Our [facility] pharmacy downstairs was not able to process her prescriptions so everything was transferred back to [facility].  She shows me her bottle of hydralazine  100 mg tablets which appears as though liquid was spilled on them.  Took her medications at 10 AM this morning.   Current HTN meds: Amlodipine /valsartan  10/320 daily, hydralazine  100 mg 3 times a day, hydrochlorothiazide  25mg  daily Previously tried: Lisinopril , labetalol , spironolactone -hydrochlorothiazide  (stopped due to cost) BP goal: <130/80  Family History:  Family History  Problem Relation Age of Onset   Diabetes Father    Cirrhosis Father    Cancer Cousin 42       breast cancer  Cancer Cousin 10       breast cancer paternal cousin     Social History:  Social History   Socioeconomic History   Marital status: Single    Spouse name: 2   Number of children: Not on file   Years of education: Not on file   Highest education level: Not on file  Occupational History   Not on file  Tobacco Use   Smoking status: Never    Passive exposure: Never   Smokeless tobacco: Never  Vaping Use   Vaping status: Never Used  Substance and Sexual Activity   Alcohol use: Not Currently    Comment: occasionally   Drug use: No   Sexual activity: Not Currently    Birth control/protection: Surgical  Other Topics Concern   Not on file  Social History Narrative   Not  on file   Social Drivers of Health   Financial Resource Strain: High Risk (09/10/2023)   Overall Financial Resource Strain (CARDIA)    Difficulty of Paying Living Expenses: Hard  Food Insecurity: Food Insecurity Present (09/10/2023)   Hunger Vital Sign    Worried About Running Out of Food in the Last Year: Sometimes true    Ran Out of Food in the Last Year: Sometimes true  Transportation Needs: No Transportation Needs (09/10/2023)   PRAPARE - Administrator, Civil Service (Medical): No    Lack of Transportation (Non-Medical): No  Physical Activity: Not on file  Stress: Not on file (01/05/2023)  Social Connections: Not on file  Intimate Partner Violence: Not on file    Diet:  Mountain dew- typically 4 bottles a day, states that she is trying to wean off of them Breakfast: boiled eggs, bacon (4) and cheese toast Lunch: microwave dinners Dinner: take out- chinese food/mexican  Exercise:  Walks about a mile when able, typically takes 30 minutes Youtube workout videos- 3 times a week  Wt Readings from Last 3 Encounters:  10/20/23 222 lb 0.1 oz (100.7 kg)  08/31/23 222 lb (100.7 kg)  06/06/23 218 lb 6.4 oz (99.1 kg)   BP Readings from Last 3 Encounters:  10/22/23 (!) 168/100  10/20/23 (!) 141/94  10/12/23 (!) 180/110   Pulse Readings from Last 3 Encounters:  10/22/23 (!) 110  10/20/23 92  10/12/23 86    Renal function: Estimated Creatinine Clearance: 92.8 mL/min (by C-G formula based on SCr of 0.9 mg/dL).  Past Medical History:  Diagnosis Date   Anxiety    Bipolar 1 disorder (HCC)    PTSD (post-traumatic stress disorder)     Current Outpatient Medications on File Prior to Visit  Medication Sig Dispense Refill   albuterol  (VENTOLIN  HFA) 108 (90 Base) MCG/ACT inhaler INHALE 1-2 PUFFS BY MOUTH EVERY 6 HOURS AS NEEDED FOR WHEEZE OR SHORTNESS OF BREATH 18 each 3   Blood Pressure Monitoring (OMRON 3 SERIES BP MONITOR) DEVI Use to measure blood pressure as  directed. 1 each 0   doxepin  (SINEQUAN ) 50 MG capsule Take 1 capsule (50 mg total) by mouth at bedtime. 30 capsule 2   fluticasone  (FLONASE ) 50 MCG/ACT nasal spray Place 1 spray into both nostrils daily. 16 g 6   fluticasone -salmeterol (ADVAIR) 100-50 MCG/ACT AEPB Inhale 1 puff into the lungs 2 (two) times daily. 1 each 3   hydrOXYzine  (VISTARIL ) 25 MG capsule TAKE ONE CAPSULE BY MOUTH IN THE MORNING AND TWO AT BED TIME 270 capsule 1   lamoTRIgine  (LAMICTAL ) 25 MG tablet Take one tablet by  mouth daily for one week and then 2 tab daily 60 tablet 0   montelukast  (SINGULAIR ) 10 MG tablet Take 1 tablet (10 mg total) by mouth at bedtime. 90 tablet 1   Multiple Vitamin (MULTIVITAMIN) capsule Take 1 capsule by mouth daily. 90 capsule 3   senna-docusate (SENOKOT-S) 8.6-50 MG tablet Take 1 tablet by mouth at bedtime. 30 tablet 3   No current facility-administered medications on file prior to visit.    No Known Allergies  Blood pressure (!) 168/100, pulse (!) 110.   Assessment/Plan: HYPERTENSION CONTROL Vitals:   10/22/23 1148 10/22/23 1149  BP: (!) 180/94 (!) 168/100    The patient's blood pressure is elevated above target today.  In order to address the patient's elevated BP:       1. Hypertension -  Essential hypertension Assessment: Blood pressure elevated at home and in the clinic Patient reports compliance with her medications Has stopped drinking [facility], some of her symptoms may be coming from caffeine withdrawal Dizziness has improved but headache persists since increasing hydralazine  to 100 mg 3 times daily  Plan: Start labetalol  100 mg twice daily Decrease hydralazine  to 50 mg 3 times daily Continue amlodipine /valsartan  10/320 daily hydrochlorothiazide  25 mg daily I have asked patient to call me with blood pressure readings in 1 week as I anticipate increase in the labetalol  dose at that time Follow-up in clinic in 3 weeks     Thank you  Keo Schirmer D Lalena Salas,  Pharm.JONETTA SARAN, CPP [facility] A Division of [facility] [address] [place], [place] [address]  Phone: [phone]; Fax: [phone]

## 2023-10-22 NOTE — Assessment & Plan Note (Signed)
 Assessment: Blood pressure elevated at home and in the clinic Patient reports compliance with her medications Has stopped drinking [facility], some of her symptoms may be coming from caffeine withdrawal Dizziness has improved but headache persists since increasing hydralazine  to 100 mg 3 times daily  Plan: Start labetalol  100 mg twice daily Decrease hydralazine  to 50 mg 3 times daily Continue amlodipine /valsartan  10/320 daily hydrochlorothiazide  25 mg daily I have asked patient to call me with blood pressure readings in 1 week as I anticipate increase in the labetalol  dose at that time Follow-up in clinic in 3 weeks

## 2023-10-22 NOTE — Patient Instructions (Addendum)
 Start taking labetalol  100mg  twice a day Decrease hydralazine  to 50mg  three times a day Continue Amlodipine /valsartan  10/320 daily, hydrochlorothiazide  25mg  daily  Continue checking blood pressure at home  Please call me in 1 week with your blood pressures [phone])  Your blood pressure goal is < 130/38mmHg  Important lifestyle changes to control high blood pressure  Intervention  Effect on the BP   Weight loss Weight loss is one of the most effective lifestyle changes for controlling blood pressure. If you're overweight or obese, losing even a small amount of weight can help reduce blood pressure.    Blood pressure can decrease by 1 millimeter of mercury (mmHg) with each kilogram (about 2.2 pounds) of weight lost.   Exercise regularly As a general goal, aim for 30 minutes of moderate physical activity every day.    Regular physical activity can lower blood pressure by 5 - 8 mmHg.   Eat a healthy diet Eat a diet rich in whole grains, fruits, vegetables, lean meat, and low-fat dairy products. Limit processed foods, saturated fat, and sweets.    A heart-healthy diet can lower high blood pressure by 10 mmHg.   Reduce salt (sodium) in your diet Aim for 000mg  of sodium each day. Avoid deli meats, canned food, and frozen microwave meals which are high in sodium.     Limiting sodium can reduce blood pressure by 5 mmHg.   Limit alcohol One drink equals 12 ounces of beer, 5 ounces of wine, or 1.5 ounces of 80-proof liquor.    Limiting alcohol to < 1 drink a day for women or < 2 drinks a day for men can help lower blood pressure by about 4 mmHg.   To check your pressure at home you will need to:   Sit up in a chair, with feet flat on the floor and back supported. Do not cross your ankles or legs. Rest your left arm so that the cuff is about heart level. If the cuff goes on your upper arm, then just relax your arm on the table, arm of the chair, or your lap. If you have a  wrist cuff, hold your wrist against your chest at heart level. Place the cuff snugly around your arm, about 1 inch above the crease of your elbow. The cords should be inside the groove of your elbow.  Sit quietly, with the cuff in place, for about 5 minutes. Then press the power button to start a reading. Do not talk or move while the reading is taking place.  Record your readings on a sheet of paper. Although most cuffs have a memory, it is often easier to see a pattern developing when the numbers are all in front of you.  You can repeat the reading after 1-3 minutes if it is recommended.   Make sure your bladder is empty and you have not had caffeine or tobacco within the last 30 minutes   Always bring your blood pressure log with you to your appointments. If you have not brought your monitor in to be double checked for accuracy, please bring it to your next appointment.   You can find a list of validated (accurate) blood pressure cuffs at: validatebp.org

## 2023-10-23 ENCOUNTER — Encounter (HOSPITAL_COMMUNITY): Payer: Self-pay | Admitting: Family

## 2023-10-23 ENCOUNTER — Ambulatory Visit (HOSPITAL_COMMUNITY): Admitting: Family

## 2023-10-23 ENCOUNTER — Other Ambulatory Visit: Payer: Self-pay

## 2023-10-23 DIAGNOSIS — F319 Bipolar disorder, unspecified: Secondary | ICD-10-CM

## 2023-10-23 DIAGNOSIS — F4310 Post-traumatic stress disorder, unspecified: Secondary | ICD-10-CM | POA: Diagnosis not present

## 2023-10-23 MED ORDER — ARIPIPRAZOLE 5 MG PO TABS: 5.0000 mg | ORAL_TABLET | Freq: Every day | ORAL | 2 refills | Status: AC

## 2023-10-23 MED ORDER — DOXEPIN HCL 50 MG PO CAPS: 50.0000 mg | ORAL_CAPSULE | Freq: Every day | ORAL | 2 refills | Status: AC

## 2023-10-23 MED ORDER — LAMOTRIGINE 25 MG PO TABS: ORAL_TABLET | ORAL | 0 refills | Status: AC

## 2023-10-23 NOTE — Progress Notes (Unsigned)
 [facility] MD/PA/NP OP Progress Note  10/23/2023 8:21 AM Deanna Powell  MRN:  [id number]  Chief Complaint: Medication management  HPI: Deanna Powell 43 year old African-American female presents for medication management appointment.  She carries a diagnosis related to bipolar affective disorder, generalized anxiety disorder and major depressive disorder.  She was seen and evaluated face-to-face by this provider.  Per chart review patient was last seen 6 months prior.  Denied that she had been taking her medications as directed.  States she last had medications maybe 2 months ago.  She reports multiple psychosocial stressors to include financial, health and housing.   Deanna Powell was tearful throughout this assessment as she states she has been dealing with being homeless.  Recently followed by cardiology with a text to get blood pressure down.  She reports she is currently employed through [employer] states that she does not make enough for the current housing market.  Reports her driver's license was suspended 2 years prior so she is having a difficult time finding auto insurance.  As she attempts to get her driver's license reinstated.  Deanna Powell is alert oriented x 3.  Denied auditory or visual hallucinations.  Reports hopelessness and feelings of self-doubt.  Denied that she has followed up with therapy services.  Denied auditory or visual hallucinations.  Denied paranoia or delusional thought content.  Discussed restarting Abilify  5 mg daily, doxepin  50 mg nightly and Lamictal  25 mg taper dosing to 50 mg.  Patient to follow-up 2 months for medication adherence/tolerability.   Visit Diagnosis:    ICD-10-CM   1. PTSD (post-traumatic stress disorder)  F43.10 lamoTRIgine  (LAMICTAL ) 25 MG tablet    2. Bipolar I disorder (HCC)  F31.9 lamoTRIgine  (LAMICTAL ) 25 MG tablet      Past Psychiatric History:   Past Medical History:  Past Medical History:  Diagnosis Date   Anxiety    Bipolar 1 disorder (HCC)     PTSD (post-traumatic stress disorder)     Past Surgical History:  Procedure Laterality Date   ABDOMINAL HYSTERECTOMY     Fibroids/heavy menses    Family Psychiatric History:   Family History:  Family History  Problem Relation Age of Onset   Diabetes Father    Cirrhosis Father    Cancer Cousin 36       breast cancer   Cancer Cousin 77       breast cancer paternal cousin    Social History:  Social History   Socioeconomic History   Marital status: Single    Spouse name: 2   Number of children: Not on file   Years of education: Not on file   Highest education level: Not on file  Occupational History   Not on file  Tobacco Use   Smoking status: Never    Passive exposure: Never   Smokeless tobacco: Never  Vaping Use   Vaping status: Never Used  Substance and Sexual Activity   Alcohol use: Not Currently    Comment: occasionally   Drug use: No   Sexual activity: Not Currently    Birth control/protection: Surgical  Other Topics Concern   Not on file  Social History Narrative   Not on file   Social Drivers of Health   Financial Resource Strain: High Risk (09/10/2023)   Overall Financial Resource Strain (CARDIA)    Difficulty of Paying Living Expenses: Hard  Food Insecurity: Food Insecurity Present (09/10/2023)   Hunger Vital Sign    Worried About Running Out of Food in the Last Year:  Sometimes true    Ran Out of Food in the Last Year: Sometimes true  Transportation Needs: No Transportation Needs (09/10/2023)   PRAPARE - Administrator, Civil Service (Medical): No    Lack of Transportation (Non-Medical): No  Physical Activity: Not on file  Stress: Not on file (01/05/2023)  Social Connections: Not on file    Allergies: No Known Allergies  Metabolic Disorder Labs: Lab Results  Component Value Date   HGBA1C 5.8 (H) 08/31/2023   MPG 120 08/31/2023   No results found for: PROLACTIN Lab Results  Component Value Date   CHOL 196 08/31/2023    TRIG 86 08/31/2023   HDL 54 08/31/2023   CHOLHDL 3.6 08/31/2023   VLDL 84.3 02/02/2021   LDLCALC 123 (H) 08/31/2023   LDLCALC 99 04/16/2023   Lab Results  Component Value Date   TSH 1.44 08/31/2023   TSH 2.540 03/22/2023    Therapeutic Level Labs: No results found for: LITHIUM Lab Results  Component Value Date   VALPROATE <4 (L) 12/24/2017   No results found for: CBMZ  Current Medications: Current Outpatient Medications  Medication Sig Dispense Refill   albuterol  (VENTOLIN  HFA) 108 (90 Base) MCG/ACT inhaler INHALE 1-2 PUFFS BY MOUTH EVERY 6 HOURS AS NEEDED FOR WHEEZE OR SHORTNESS OF BREATH 18 each 3   amLODipine -valsartan  (EXFORGE ) 10-320 MG tablet Take 1 tablet by mouth daily. 90 tablet 3   ARIPiprazole  (ABILIFY ) 5 MG tablet Take 1 tablet (5 mg total) by mouth daily. 30 tablet 2   Blood Pressure Monitoring (OMRON 3 SERIES BP MONITOR) DEVI Use to measure blood pressure as directed. 1 each 0   fluticasone  (FLONASE ) 50 MCG/ACT nasal spray Place 1 spray into both nostrils daily. 16 g 6   fluticasone -salmeterol (ADVAIR) 100-50 MCG/ACT AEPB Inhale 1 puff into the lungs 2 (two) times daily. 1 each 3   hydrALAZINE  (APRESOLINE ) 50 MG tablet Take 1 tablet (50 mg total) by mouth 3 (three) times daily. 270 tablet 3   hydrochlorothiazide  (HYDRODIURIL ) 25 MG tablet Take 1 tablet (25 mg total) by mouth daily. 90 tablet 3   hydrOXYzine  (VISTARIL ) 25 MG capsule TAKE ONE CAPSULE BY MOUTH IN THE MORNING AND TWO AT BED TIME 270 capsule 1   labetalol  (NORMODYNE ) 100 MG tablet Take 1 tablet (100 mg total) by mouth 2 (two) times daily. 60 tablet 0   montelukast  (SINGULAIR ) 10 MG tablet Take 1 tablet (10 mg total) by mouth at bedtime. 90 tablet 1   Multiple Vitamin (MULTIVITAMIN) capsule Take 1 capsule by mouth daily. 90 capsule 3   senna-docusate (SENOKOT-S) 8.6-50 MG tablet Take 1 tablet by mouth at bedtime. 30 tablet 3   doxepin  (SINEQUAN ) 50 MG capsule Take 1 capsule (50 mg total) by mouth at  bedtime. 30 capsule 2   lamoTRIgine  (LAMICTAL ) 25 MG tablet Take one tablet by mouth daily for one week and then 2 tab daily 60 tablet 0   No current facility-administered medications for this visit.     Musculoskeletal: Strength & Muscle Tone: within normal limits Gait & Station: normal Patient leans: N/A  Psychiatric Specialty Exam: Review of Systems  Blood pressure (!) 162/111, pulse 99, height 5' 6 (1.676 m), weight 216 lb (98 kg).Body mass index is 34.86 kg/m.  General Appearance: Casual  Eye Contact:  Good  Speech:  Clear and Coherent  Volume:  Normal  Mood:  Anxious and Depressed  Affect:  Congruent  Thought Process:  Coherent  Orientation:  Full (Time,  Place, and Person)  Thought Content: Logical   Suicidal Thoughts:  Yes.  without intent/plan  Homicidal Thoughts:  No  Memory:  Immediate;   Good Recent;   Good  Judgement:  Good  Insight:  Good  Psychomotor Activity:  Normal  Concentration:  Concentration: Good  Recall:  Good  Fund of Knowledge: Good  Language: Good  Akathisia:  No  Handed:  Right  AIMS (if indicated): not done  Assets:  Communication Skills Desire for Improvement  ADL's:  Intact  Cognition: WNL  Sleep:  Poor   Screenings: GAD-7    Flowsheet Row Office Visit from 03/22/2023 in [facility]  Total GAD-7 Score 21   PHQ2-9    Flowsheet Row Office Visit from 08/31/2023 in [facility] Office Visit from 05/01/2023 in [facility] Video Visit from 04/20/2023 in [facility] Office Visit from 03/22/2023 in [facility] Office Visit from 01/31/2023 in [facility]  PHQ-2 Total Score 2 5 2 6 2   PHQ-9 Total Score 8 19 20 24 10    Flowsheet Row ED from 10/20/2023 in [facility] at [facility] ED from 01/25/2023 in [facility] Department at [facility] ED from 01/13/2022 in [facility] at [facility]  C-SSRS RISK CATEGORY No Risk No Risk No Risk     Assessment and Plan: Deanna Powell 43 year old African-American female presents for medication management follow-up appointment.  Patient's had 2 missed appointments previously.  Last seen and evaluated by this provider 6 months prior where she was previously prescribed Abilify , Lamictal  and doxepin .  Reports she felt that medication combination helped stabilize her mood and would like to be restarted on medications.  Stated she last had medications 2 months prior.  Does have thoughts of despair denying plan or intent with suicidal ideations.  Denies auditory visual hallucinations.  She reports limited family support.  Will continue to monitor for symptoms.  Support, encouragement and reassurance was provided.  Collaboration of Care: Collaboration of Care: Medication Management AEB restarted previous medications Lamictal  25 mg patient to taper to 50 mg after 2 weeks, restarted doxepin  50 mg and Abilify  5 mg daily.  Patient to follow-up 2 months for medication adherence/tolerability.  Patient/Guardian was advised Release of Information must be obtained prior to any record release in order to collaborate their care with an outside provider. Patient/Guardian was advised if they have not already done so to contact the registration department to sign all necessary forms in order for us  to release information regarding their care.   Consent: Patient/Guardian gives verbal consent for treatment and assignment of benefits for services provided during this visit. Patient/Guardian expressed understanding and agreed to proceed.    Staci LOISE Kerns, NP 10/23/2023, 8:21 AM

## 2023-10-31 ENCOUNTER — Telehealth: Payer: Self-pay | Admitting: Pharmacist

## 2023-10-31 NOTE — Telephone Encounter (Signed)
 Tried to call patient to follow-up on her blood pressure and to attempt to titrate her labetalol .  No answer and voicemail was full.  Will try again later

## 2023-11-12 ENCOUNTER — Ambulatory Visit: Attending: Cardiology | Admitting: Pharmacist

## 2023-11-12 DIAGNOSIS — I10 Essential (primary) hypertension: Secondary | ICD-10-CM

## 2023-11-12 MED ORDER — HYDROCHLOROTHIAZIDE 25 MG PO TABS: 25.0000 mg | ORAL_TABLET | Freq: Every day | ORAL | 3 refills | Status: AC

## 2023-11-12 MED ORDER — AMLODIPINE BESYLATE-VALSARTAN 10-320 MG PO TABS: 1.0000 | ORAL_TABLET | Freq: Every day | ORAL | 3 refills | Status: DC

## 2023-11-12 MED ORDER — LABETALOL HCL 100 MG PO TABS: 100.0000 mg | ORAL_TABLET | Freq: Two times a day (BID) | ORAL | 3 refills | Status: AC

## 2023-11-12 NOTE — Patient Instructions (Addendum)
 Please pick up amlodipine /valsartan , hydrochlorothiazide  and labetalol  from the pharmacy today  Please call me when you get home and tell me what you have and have not been taking [phone]. Leave me a voicemail if I can't answer  Continue to check blood pressure at home Next visit please bring ALL your medications and your blood pressure list
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
## 2023-11-12 NOTE — Assessment & Plan Note (Signed)
 Assessment: Blood pressure very uncontrolled, minimal improvement Unclear exactly what she is taking, but I suspect it is not everything she was prescribed I have asked her to call me with everything she has at home that she IS taking pretty regularly, so I can advise her on what she should be taking I sent in refills for amlodipine /valsartan , labetalol  and hydrochlorothiazide  to [facility] of network). I suspect she has not been taking amlodipine /valsartan  or the labetalol   Plan: Plan pending phone call F/u in office 11/7

## 2023-11-14 ENCOUNTER — Ambulatory Visit: Admitting: Pharmacist

## 2023-11-15 ENCOUNTER — Telehealth: Payer: Self-pay | Admitting: Pharmacist

## 2023-11-15 NOTE — Telephone Encounter (Signed)
 Tried to call patient to see what medications she has been taking. No answer VM full. This is the third time since Monday I have tried to call her with no answer.

## 2023-11-23 ENCOUNTER — Ambulatory Visit: Admitting: Pharmacist
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
## 2023-11-26 ENCOUNTER — Telehealth: Payer: Self-pay | Admitting: Pharmacist

## 2023-11-26 NOTE — Telephone Encounter (Signed)
 Patient rescheduled her apt with me  Friday. Not scheduled again until Jan. Needs to be seen sooner. I have tried to call her 5 times since last visit. Twice today. No answer and cannot leave VM. I called her son and asked him to have her give me a call at [phone]

## 2023-11-27 ENCOUNTER — Telehealth (HOSPITAL_COMMUNITY): Admitting: Family

## 2023-11-27 ENCOUNTER — Encounter (HOSPITAL_COMMUNITY): Payer: Self-pay

## 2024-01-18 ENCOUNTER — Ambulatory Visit: Attending: Cardiology | Admitting: Pharmacist

## 2024-01-31 ENCOUNTER — Telehealth: Payer: Self-pay | Admitting: Pharmacist

## 2024-01-31 NOTE — Telephone Encounter (Signed)
 Patient missed her appointment with me last week.  Tried to call to reschedule.  No answer and voicemail full.  Will try again later.

## 2024-02-15 ENCOUNTER — Ambulatory Visit: Payer: Self-pay

## 2024-02-15 NOTE — Telephone Encounter (Signed)
 Please see triage notes from triage nurse, patient does have an appointment on Monday to be seen in office, Nurse has instructed to go Urgent Care today.   Message sent to Ngetich, Dinah C, NP

## 2024-02-15 NOTE — Telephone Encounter (Signed)
 Agree with plans to go to urgent care to evaluate cough.

## 2024-02-15 NOTE — Telephone Encounter (Signed)
 Appt made for Monday however pt is requesting to be seen today, please call her if she can be squeezed in. Otherwise the pt has been instructed to go to UC today as she wishes to be seen ASAP.    FYI Only or Action Required?: Action required by provider: request for appointment.  Patient was last seen in primary care on 08/31/2023 by Ngetich, Roxan BROCKS, NP.  Called Nurse Triage reporting URI.  Symptoms began a week ago.  Interventions attempted: OTC medications: mucinex.  Symptoms are: unchanged.  Triage Disposition: Home Care  Patient/caregiver understands and will follow disposition?: No, wishes to speak with PCP    Reason for Triage: PT has Severe Cough/Cold since 01/25. Mucus yellow/Trouble sleeping  Reason for Disposition  Cough with cold symptoms (e.g., runny nose, postnasal drip, throat clearing)  Answer Assessment - Initial Assessment Questions 1. ONSET: When did the cough begin?      Sunday  2. SEVERITY: How bad is the cough today?      Persistent ... It's bad  3. SPUTUM: Describe the color of your sputum (e.g., none, dry cough; clear, white, yellow, green)     A little bit ... A little green, mostly clear.   4. HEMOPTYSIS: Are you coughing up any blood? If Yes, ask: How much? (e.g., flecks, streaks, tablespoons, etc.)     Denies  5. DIFFICULTY BREATHING: Are you having difficulty breathing? If Yes, ask: How bad is it? (e.g., mild, moderate, severe)      A little ... Reports occasional wheezing. Pt noted to be speaking comfortably in multiple full sentences at a time  6. FEVER: Do you have a fever? If Yes, ask: What is your temperature, how was it measured, and when did it start?     Denies  7. CARDIAC HISTORY: Do you have any history of heart disease? (e.g., heart attack, congestive heart failure)      HTN  10. OTHER SYMPTOMS: Do you have any other symptoms? (e.g., runny nose, wheezing, chest pain)       Denies   Taking mucinex and  allergy pills  Protocols used: Cough - Acute Productive-A-AH

## 2024-02-18 ENCOUNTER — Ambulatory Visit: Admitting: Family

## 2024-02-18 NOTE — Telephone Encounter (Signed)
 Patient received notification to reschedule appt from today to tomorrow. Rescheduled to 02/19/24. Pt reports no worsening symptoms.

## 2024-02-19 ENCOUNTER — Ambulatory Visit: Admitting: Family

## 2024-02-19 ENCOUNTER — Encounter: Payer: Self-pay | Admitting: Family

## 2024-02-19 VITALS — BP 160/100 | HR 101 | Temp 98.1°F | Ht 66.0 in | Wt 225.2 lb

## 2024-02-19 DIAGNOSIS — G4733 Obstructive sleep apnea (adult) (pediatric): Secondary | ICD-10-CM | POA: Diagnosis not present

## 2024-02-19 DIAGNOSIS — J4541 Moderate persistent asthma with (acute) exacerbation: Secondary | ICD-10-CM

## 2024-02-19 DIAGNOSIS — K5901 Slow transit constipation: Secondary | ICD-10-CM

## 2024-02-19 DIAGNOSIS — E6609 Other obesity due to excess calories: Secondary | ICD-10-CM | POA: Diagnosis not present

## 2024-02-19 DIAGNOSIS — R6889 Other general symptoms and signs: Secondary | ICD-10-CM

## 2024-02-19 DIAGNOSIS — I10 Essential (primary) hypertension: Secondary | ICD-10-CM

## 2024-02-19 DIAGNOSIS — Z113 Encounter for screening for infections with a predominantly sexual mode of transmission: Secondary | ICD-10-CM | POA: Diagnosis not present

## 2024-02-19 DIAGNOSIS — J301 Allergic rhinitis due to pollen: Secondary | ICD-10-CM

## 2024-02-19 LAB — POC COVID19 BINAXNOW: SARS Coronavirus 2 Ag: NEGATIVE

## 2024-02-19 LAB — POCT INFLUENZA A/B
Influenza A, POC: NEGATIVE
Influenza B, POC: NEGATIVE

## 2024-02-19 MED ORDER — MONTELUKAST SODIUM 10 MG PO TABS: 10.0000 mg | ORAL_TABLET | Freq: Every day | ORAL | 1 refills | Status: AC

## 2024-02-19 MED ORDER — ALBUTEROL SULFATE HFA 108 (90 BASE) MCG/ACT IN AERS: INHALATION_SPRAY | RESPIRATORY_TRACT | 3 refills | Status: AC

## 2024-02-19 MED ORDER — ZEPBOUND 2.5 MG/0.5ML ~~LOC~~ SOAJ: 2.5000 mg | SUBCUTANEOUS | 3 refills | Status: AC

## 2024-02-19 MED ORDER — AMLODIPINE BESYLATE-VALSARTAN 10-320 MG PO TABS: 1.0000 | ORAL_TABLET | Freq: Every day | ORAL | 3 refills | Status: AC

## 2024-02-19 MED ORDER — FLUTICASONE PROPIONATE 50 MCG/ACT NA SUSP: 1.0000 | Freq: Every day | NASAL | 6 refills | Status: AC

## 2024-02-19 MED ORDER — FLUTICASONE-SALMETEROL 100-50 MCG/ACT IN AEPB: 1.0000 | INHALATION_SPRAY | Freq: Two times a day (BID) | RESPIRATORY_TRACT | 3 refills | Status: AC

## 2024-02-19 MED ORDER — PREDNISONE 20 MG PO TABS: 40.0000 mg | ORAL_TABLET | Freq: Every day | ORAL | 0 refills | Status: AC

## 2024-02-19 NOTE — Progress Notes (Signed)
 "   Provider: Raelynn Corron FNP-C   Chrissa Meetze, Roxan BROCKS, NP  Patient Care Team: Zuri Bradway, Roxan BROCKS, NP as PCP - General (Family Medicine) Ladona Heinz, MD as PCP - Cardiology (Cardiology) Neda Jennet LABOR, MD as Consulting Physician (Pulmonary Disease)  Extended Emergency Contact Information Primary Emergency Contact: [facility] Phone: [phone] Relation: Son Secondary Emergency Contact: [name],[name] Mobile Phone: [phone] Relation: Relative  Code Status:  Full Code  Goals of care: Advanced Directive information    02/19/2024   10:33 AM  Advanced Directives  Does Patient Have a Medical Advance Directive? No  Would patient like information on creating a medical advance directive? No - Patient declined     Chief Complaint  Patient presents with   URI    Wheezing.Pt would like STD Screening.     History of Present Illness   Deanna Powell is a 44 year old female with asthma who presents with a persistent cough.  She has been experiencing a persistent cough for over a week, which she describes as embarrassing. The cough sometimes produces phlegm, which is occasionally clear. She notes increased wheezing and coughing recently. No fever or chills, and only a slightly stuffy nose are reported.  She has attempted to manage the cough with Mucinex and various over-the-counter cough medications without relief. She has not been taking Singulair  due to being away from home and unable to pick up her prescription. She reports she has been using more than usual of her asthma medications, including albuterol  and Advair, without relief.  She reports ongoing issues with constipation. She has tried Senokot, which she states 'doesn't work', and has also used Miralax. She has not been taking her prescribed doxepin , amlodipine , or multivitamins due to not picking up her refills.  She mentions a recent return to work after a layoff, although not in her usual position. She describes  stress related to job security and recent weather conditions affecting work surveyor, mining.  She requests an STD screening after discovering a partner was unfaithful. She reports using protection inconsistently and denies any symptoms such as discharge.    Past Medical History:  Diagnosis Date   Anxiety    Bipolar 1 disorder (HCC)    PTSD (post-traumatic stress disorder)    Past Surgical History:  Procedure Laterality Date   ABDOMINAL HYSTERECTOMY     Fibroids/heavy menses    Allergies[1]  Allergies as of 02/19/2024   No Known Allergies      Medication List        Accurate as of February 19, 2024  2:18 PM. If you have any questions, ask your nurse or doctor.          STOP taking these medications    multivitamin capsule Stopped by: Jovita Persing, NP       TAKE these medications    albuterol  108 (90 Base) MCG/ACT inhaler Commonly known as: VENTOLIN  HFA INHALE 1-2 PUFFS BY MOUTH EVERY 6 HOURS AS NEEDED FOR WHEEZE OR SHORTNESS OF BREATH   amLODipine -valsartan  10-320 MG tablet Commonly known as: EXFORGE  Take 1 tablet by mouth daily.   ARIPiprazole  5 MG tablet Commonly known as: ABILIFY  Take 1 tablet (5 mg total) by mouth daily.   doxepin  50 MG capsule Commonly known as: SINEQUAN  Take 1 capsule (50 mg total) by mouth at bedtime.   fluticasone  50 MCG/ACT nasal spray Commonly known as: FLONASE  Place 1 spray into both nostrils daily.   fluticasone -salmeterol 100-50 MCG/ACT Aepb Commonly known as: ADVAIR Inhale 1 puff into the  lungs 2 (two) times daily.   hydrALAZINE  50 MG tablet Commonly known as: APRESOLINE  Take 1 tablet (50 mg total) by mouth 3 (three) times daily.   hydrochlorothiazide  25 MG tablet Commonly known as: HYDRODIURIL  Take 1 tablet (25 mg total) by mouth daily.   hydrOXYzine  25 MG capsule Commonly known as: VISTARIL  TAKE ONE CAPSULE BY MOUTH IN THE MORNING AND TWO AT BED TIME   labetalol  100 MG tablet Commonly known as: NORMODYNE  Take  1 tablet (100 mg total) by mouth 2 (two) times daily.   lamoTRIgine  25 MG tablet Commonly known as: LaMICtal  Take one tablet by mouth daily for one week and then 2 tab daily   montelukast  10 MG tablet Commonly known as: SINGULAIR  Take 1 tablet (10 mg total) by mouth at bedtime.   Omron 3 Series BP Monitor Devi Use to measure blood pressure as directed.   predniSONE  20 MG tablet Commonly known as: DELTASONE  Take 2 tablets (40 mg total) by mouth daily with breakfast for 5 days. Started by: Zebulin Siegel, NP   senna-docusate 8.6-50 MG tablet Commonly known as: Senokot-S Take 1 tablet by mouth at bedtime.   Zepbound  2.5 MG/0.5ML Pen Generic drug: tirzepatide  Inject 2.5 mg into the skin once a week. Started by: Roxan Plough, NP        Review of Systems  Constitutional:  Negative for appetite change, chills, fatigue, fever and unexpected weight change.  HENT:  Negative for congestion, ear discharge, ear pain, hearing loss, nosebleeds, postnasal drip, rhinorrhea, sinus pressure, sinus pain, sneezing, sore throat, tinnitus and trouble swallowing.   Eyes:  Negative for pain, discharge, redness, itching and visual disturbance.  Respiratory:  Positive for cough and wheezing. Negative for chest tightness and shortness of breath.   Cardiovascular:  Negative for chest pain, palpitations and leg swelling.  Gastrointestinal:  Negative for abdominal distention, abdominal pain, blood in stool, constipation, diarrhea, nausea and vomiting.  Skin:  Negative for color change, pallor and rash.  Neurological:  Negative for dizziness, weakness, light-headedness and headaches.    Immunization History  Administered Date(s) Administered   Influenza, Seasonal, Injecte, Preservative Fre 11/26/2014, 01/31/2023   Influenza,inj,Quad PF,6+ Mos 11/26/2014, 12/15/2016   PNEUMOCOCCAL CONJUGATE-20 05/01/2023   Tdap 02/02/2021   Pertinent  Health Maintenance Due  Topic Date Due   Mammogram  Never done    Colonoscopy  03/30/2024 (Originally 06/23/2023)   Influenza Vaccine  04/15/2024 (Originally 08/17/2023)      09/28/2022    9:23 AM 01/31/2023    3:10 PM 05/01/2023    9:56 AM 08/31/2023   11:01 AM 02/19/2024   10:33 AM  Fall Risk  Falls in the past year? 0 0 0 0 0  Was there an injury with Fall? 0  0  0  0  0  Fall Risk Category Calculator 0 0 0 0 0  Patient at Risk for Falls Due to No Fall Risks  No Fall Risks No Fall Risks No Fall Risks  Fall risk Follow up Falls evaluation completed;Education provided;Falls prevention discussed  Falls evaluation completed Falls evaluation completed Falls evaluation completed     Data saved with a previous flowsheet row definition   Functional Status Survey:    Vitals:   02/19/24 1038 02/19/24 1121  BP: (!) 168/110 (!) 160/100  Pulse: (!) 101   Temp: 98.1 F (36.7 C)   SpO2: 97%   Weight: 225 lb 3.2 oz (102.2 kg)   Height: 5' 6 (1.676 m)    Body mass  index is 36.35 kg/m. Physical Exam  MEASUREMENTS: Weight- 225 lbs, BMI- 36.5. GENERAL: Alert, cooperative, well developed, no acute distress. HEENT: Normocephalic, normal oropharynx, moist mucous membranes. CHEST: Clear to auscultation bilaterally, no wheezes, rhonchi, or crackles. CARDIOVASCULAR: Normal heart rate and rhythm, S1 and S2 normal without murmurs. ABDOMEN: Soft, non-tender, non-distended, without organomegaly, normal bowel sounds. EXTREMITIES: No cyanosis or edema. NEUROLOGICAL: Cranial nerves grossly intact, moves all extremities without gross motor or sensory deficit.  SKIN: No rash,no lesion or erythema   PSYCHIATRY/BEHAVIORAL: Mood stable    Labs reviewed: Recent Labs    08/31/23 1159 10/05/23 0931 10/20/23 0146 10/20/23 0153  NA 139 143 137 139  K 3.6 4.0 3.6 3.6  CL 102 105 104 105  CO2 27 22 21*  --   GLUCOSE 106* 91 104* 103*  BUN 14 13 5* 5*  CREATININE 0.86 0.92 0.99 0.90  CALCIUM 9.5 9.2 8.7*  --    Recent Labs    04/16/23 1535 08/31/23 1159  10/20/23 0146  AST 19 14 24   ALT 21 14 17   ALKPHOS 84  --  58  BILITOT 0.3 0.4 0.4  PROT 6.8 7.2 7.0  ALBUMIN 4.3  --  3.7   Recent Labs    04/16/23 1535 08/31/23 1159 10/20/23 0146 10/20/23 0153  WBC 4.7 4.2 9.9  --   NEUTROABS 2.7 2,230 7.3  --   HGB 11.1 11.7 11.2* 12.9  HCT 37.4 41.0 37.9 38.0  MCV 73* 75.4* 72.5*  --   PLT 309 308 343  --    Lab Results  Component Value Date   TSH 1.44 08/31/2023   Lab Results  Component Value Date   HGBA1C 5.8 (H) 08/31/2023   Lab Results  Component Value Date   CHOL 196 08/31/2023   HDL 54 08/31/2023   LDLCALC 123 (H) 08/31/2023   TRIG 86 08/31/2023   CHOLHDL 3.6 08/31/2023    Significant Diagnostic Results in last 30 days:  No results found.  Assessment/Plan  Moderate persistent asthma with acute exacerbation Asthma exacerbation with increased wheezing and coughing for over a week. No fever or chills. Likely due to discontinuation of Singulair . No signs of infection. - Refilled Singulair  prescription - Refilled Advair and albuterol  inhalers - Prescribed prednisone  40 mg daily for 5 days - Advised to report if symptoms worsen  Essential Hypertension  - B/p elevated Run out of her medication since she was laid off but recently restarted her job  - Refill Amlodipine  - Valsartan   - Medication compliance advised  - dietary modification and exercise advised    Obesity due to excess calories BMI of 36.35. Discussed weight loss options including Zepbound  for weight management and obstructive sleep apnea. Insurance coverage considerations discussed. Zepbound  requires weekly injections with potential side effects including nausea, vomiting, and constipation. Emphasized the importance of starting with a smaller dose to mitigate side effects and the need for regular bowel movements. - Prescribed Zepbound  for weight management and obstructive sleep apnea - Advised on potential side effects of Zepbound  including nausea, vomiting,  and constipation - Recommended daily use of Miralax or Senokot to prevent constipation - Encouraged increased intake of fruits, vegetables, and water  Obstructive sleep apnea Contributing to obesity. Zepbound  prescribed to aid in weight management and potentially improve sleep apnea symptoms. - Prescribed Zepbound  for obstructive sleep apnea  Constipation Chronic constipation not relieved by Senokot. Previous trial of Miralax powder was ineffective. - Continue Senokot - Recommended daily use of Miralax or Senokot  to ensure regular bowel movements   Allergic rhinitis/ Flu like symptoms  Request refill on Flonase   - COVID-19 negative   General health maintenance Discussed the importance of using protection to prevent STDs. Recent unprotected sexual encounter due to alcohol consumption. - Ordered STD screening including HIV, gonorrhea, and chlamydia - Advised on the use of protection to prevent STDs   Family/ staff Communication: Reviewed plan of care with patient verbalized understanding   Labs/tests ordered:  - Chlamydia/Neisseria Gonorrhea RNA,TMA,Urogenital - HIV Reflex   - POC Influenza A/B - POC COVID-19   Next Appointment : Return if symptoms worsen or fail to improve.   Spent 22 minutes of Face to face and non-face to face with patient  >50% time spent counseling; reviewing medical record; tests; labs; documentation and developing future plan of care.   Roxan BROCKS Keath Matera, NP      [1] No Known Allergies  "

## 2024-02-22 LAB — CHLAMYDIA/NEISSERIA GONORRHOEAE RNA,TMA,UROGENTIAL
C. trachomatis RNA, TMA: NOT DETECTED
N. gonorrhoeae RNA, TMA: NOT DETECTED

## 2024-02-22 LAB — HIV ANTIBODY (ROUTINE TESTING W REFLEX)
HIV 1&2 Ab, 4th Generation: NONREACTIVE
HIV FINAL INTERPRETATION: NEGATIVE

## 2024-03-03 ENCOUNTER — Ambulatory Visit: Payer: Self-pay | Admitting: Family
